# Patient Record
Sex: Female | Born: 1935
Health system: Southern US, Community
[De-identification: ages and names within clinical notes are randomized; demographics above are authoritative.]

## PROBLEM LIST (undated history)

## (undated) DIAGNOSIS — K222 Esophageal obstruction: Secondary | ICD-10-CM

## (undated) DIAGNOSIS — G459 Transient cerebral ischemic attack, unspecified: Secondary | ICD-10-CM

## (undated) DIAGNOSIS — K224 Dyskinesia of esophagus: Secondary | ICD-10-CM

## (undated) DIAGNOSIS — IMO0002 Reserved for concepts with insufficient information to code with codable children: Secondary | ICD-10-CM

## (undated) DIAGNOSIS — F32A Depression, unspecified: Secondary | ICD-10-CM

## (undated) DIAGNOSIS — K5790 Diverticulosis of intestine, part unspecified, without perforation or abscess without bleeding: Secondary | ICD-10-CM

## (undated) DIAGNOSIS — K219 Gastro-esophageal reflux disease without esophagitis: Secondary | ICD-10-CM

## (undated) DIAGNOSIS — K120 Recurrent oral aphthae: Secondary | ICD-10-CM

## (undated) DIAGNOSIS — R12 Heartburn: Secondary | ICD-10-CM

## (undated) DIAGNOSIS — I10 Essential (primary) hypertension: Secondary | ICD-10-CM

## (undated) DIAGNOSIS — F329 Major depressive disorder, single episode, unspecified: Secondary | ICD-10-CM

## (undated) HISTORY — DX: Major depressive disorder, single episode, unspecified: F32.9

## (undated) HISTORY — DX: Transient cerebral ischemic attack, unspecified: G45.9

## (undated) HISTORY — DX: Heartburn: R12

## (undated) HISTORY — DX: Diverticulosis of intestine, part unspecified, without perforation or abscess without bleeding: K57.90

## (undated) HISTORY — PX: JOINT REPLACEMENT: SHX530

## (undated) HISTORY — DX: Essential (primary) hypertension: I10

## (undated) HISTORY — PX: CATARACT EXTRACTION: SUR2

## (undated) HISTORY — DX: Depression, unspecified: F32.A

## (undated) HISTORY — PX: COLON SURGERY: SHX602

## (undated) HISTORY — DX: Dyskinesia of esophagus: K22.4

## (undated) HISTORY — PX: APPENDECTOMY: SHX54

## (undated) HISTORY — DX: Reserved for concepts with insufficient information to code with codable children: IMO0002

## (undated) HISTORY — PX: SHOULDER SURGERY: SHX246

## (undated) HISTORY — DX: Recurrent oral aphthae: K12.0

## (undated) HISTORY — DX: Esophageal obstruction: K22.2

---

## 1991-09-02 HISTORY — PX: PARTIAL COLECTOMY: SHX5273

## 1998-04-08 ENCOUNTER — Other Ambulatory Visit: Admission: RE | Admit: 1998-04-08 | Discharge: 1998-04-08 | Payer: Self-pay | Admitting: Family Medicine

## 1999-04-30 ENCOUNTER — Other Ambulatory Visit: Admission: RE | Admit: 1999-04-30 | Discharge: 1999-04-30 | Payer: Self-pay | Admitting: Family Medicine

## 2000-08-15 ENCOUNTER — Other Ambulatory Visit: Admission: RE | Admit: 2000-08-15 | Discharge: 2000-08-15 | Payer: Self-pay | Admitting: Family Medicine

## 2001-02-27 ENCOUNTER — Encounter: Admission: RE | Admit: 2001-02-27 | Discharge: 2001-02-27 | Payer: Self-pay | Admitting: Family Medicine

## 2001-02-27 ENCOUNTER — Encounter: Payer: Self-pay | Admitting: Family Medicine

## 2001-07-19 ENCOUNTER — Emergency Department (HOSPITAL_COMMUNITY): Admission: EM | Admit: 2001-07-19 | Discharge: 2001-07-19 | Payer: Self-pay | Admitting: Emergency Medicine

## 2001-07-19 ENCOUNTER — Encounter: Payer: Self-pay | Admitting: Emergency Medicine

## 2001-08-14 ENCOUNTER — Ambulatory Visit (HOSPITAL_COMMUNITY): Admission: RE | Admit: 2001-08-14 | Discharge: 2001-08-14 | Payer: Self-pay | Admitting: Internal Medicine

## 2001-08-23 ENCOUNTER — Ambulatory Visit (HOSPITAL_COMMUNITY): Admission: RE | Admit: 2001-08-23 | Discharge: 2001-08-23 | Payer: Self-pay | Admitting: Internal Medicine

## 2001-12-26 ENCOUNTER — Encounter: Payer: Self-pay | Admitting: Orthopedic Surgery

## 2001-12-30 HISTORY — PX: TOTAL HIP ARTHROPLASTY: SHX124

## 2001-12-31 ENCOUNTER — Inpatient Hospital Stay (HOSPITAL_COMMUNITY): Admission: RE | Admit: 2001-12-31 | Discharge: 2002-01-07 | Payer: Self-pay | Admitting: Orthopedic Surgery

## 2001-12-31 ENCOUNTER — Encounter: Payer: Self-pay | Admitting: Orthopedic Surgery

## 2002-01-03 ENCOUNTER — Encounter: Payer: Self-pay | Admitting: Orthopedic Surgery

## 2002-01-07 ENCOUNTER — Inpatient Hospital Stay (HOSPITAL_COMMUNITY)
Admission: AD | Admit: 2002-01-07 | Discharge: 2002-01-12 | Payer: Self-pay | Admitting: Physical Medicine & Rehabilitation

## 2002-02-12 ENCOUNTER — Encounter: Admission: RE | Admit: 2002-02-12 | Discharge: 2002-05-09 | Payer: Self-pay | Admitting: Orthopedic Surgery

## 2002-09-11 ENCOUNTER — Other Ambulatory Visit: Admission: RE | Admit: 2002-09-11 | Discharge: 2002-09-11 | Payer: Self-pay | Admitting: Family Medicine

## 2002-09-24 ENCOUNTER — Encounter (INDEPENDENT_AMBULATORY_CARE_PROVIDER_SITE_OTHER): Payer: Self-pay

## 2002-09-24 ENCOUNTER — Ambulatory Visit (HOSPITAL_COMMUNITY): Admission: RE | Admit: 2002-09-24 | Discharge: 2002-09-24 | Payer: Self-pay | Admitting: Obstetrics and Gynecology

## 2004-01-06 ENCOUNTER — Other Ambulatory Visit: Admission: RE | Admit: 2004-01-06 | Discharge: 2004-01-06 | Payer: Self-pay | Admitting: Family Medicine

## 2004-12-01 ENCOUNTER — Encounter: Admission: RE | Admit: 2004-12-01 | Discharge: 2004-12-01 | Payer: Self-pay | Admitting: Family Medicine

## 2004-12-21 ENCOUNTER — Other Ambulatory Visit: Admission: RE | Admit: 2004-12-21 | Discharge: 2004-12-21 | Payer: Self-pay | Admitting: Family Medicine

## 2005-10-26 ENCOUNTER — Encounter: Admission: RE | Admit: 2005-10-26 | Discharge: 2005-10-26 | Payer: Self-pay | Admitting: Family Medicine

## 2006-11-01 ENCOUNTER — Ambulatory Visit: Payer: Self-pay | Admitting: Internal Medicine

## 2006-11-01 ENCOUNTER — Inpatient Hospital Stay (HOSPITAL_COMMUNITY): Admission: EM | Admit: 2006-11-01 | Discharge: 2006-11-07 | Payer: Self-pay | Admitting: Emergency Medicine

## 2006-11-01 ENCOUNTER — Encounter: Payer: Self-pay | Admitting: Internal Medicine

## 2006-11-08 ENCOUNTER — Ambulatory Visit: Payer: Self-pay | Admitting: Internal Medicine

## 2006-12-05 ENCOUNTER — Ambulatory Visit (HOSPITAL_COMMUNITY): Admission: RE | Admit: 2006-12-05 | Discharge: 2006-12-05 | Payer: Self-pay | Admitting: Internal Medicine

## 2006-12-07 ENCOUNTER — Ambulatory Visit (HOSPITAL_COMMUNITY): Admission: RE | Admit: 2006-12-07 | Discharge: 2006-12-07 | Payer: Self-pay | Admitting: Internal Medicine

## 2006-12-12 ENCOUNTER — Ambulatory Visit: Payer: Self-pay | Admitting: Internal Medicine

## 2007-09-11 ENCOUNTER — Encounter: Admission: RE | Admit: 2007-09-11 | Discharge: 2007-09-11 | Payer: Self-pay | Admitting: Family Medicine

## 2010-06-02 ENCOUNTER — Ambulatory Visit: Payer: Self-pay | Admitting: Cardiovascular Disease

## 2010-06-14 ENCOUNTER — Telehealth (INDEPENDENT_AMBULATORY_CARE_PROVIDER_SITE_OTHER): Payer: Self-pay | Admitting: *Deleted

## 2010-06-15 ENCOUNTER — Encounter: Payer: Self-pay | Admitting: Cardiovascular Disease

## 2010-06-15 ENCOUNTER — Ambulatory Visit: Payer: Self-pay

## 2010-06-15 ENCOUNTER — Ambulatory Visit: Payer: Self-pay | Admitting: Cardiovascular Disease

## 2010-06-15 ENCOUNTER — Encounter (HOSPITAL_COMMUNITY)
Admission: RE | Admit: 2010-06-15 | Discharge: 2010-07-31 | Payer: Self-pay | Source: Home / Self Care | Attending: Cardiovascular Disease | Admitting: Cardiovascular Disease

## 2010-08-31 NOTE — Progress Notes (Signed)
Summary: Nuclear pre procedure  Phone Note Outgoing Call Call back at [facility] Phone [phone]   Call placed by: Allen Kell, RT-N,  June 14, 2010 3:25 PM Call placed to: Patient Summary of Call: Reviewed information on My[employer] Information Sheet (see scanned document for further details).  Spoke with patient.

## 2010-08-31 NOTE — Assessment & Plan Note (Signed)
Summary: Cardiology Nuclear Testing  Nuclear Med Background Indications for Stress Test: Evaluation for Ischemia   History: Echo   Symptoms: Chest Pain, Chest Tightness, Fatigue    Nuclear Pre-Procedure Cardiac Risk Factors: Family History - CAD, History of Smoking, Lipids Caffeine/Decaff Intake: None NPO After: 9:00 PM Lungs: clear IV 0.9% NS with Angio Cath: 22g     IV Site: R Wrist IV Started by: Irean Hong, RN Chest Size (in) 40     Cup Size D     Height (in): 64 Weight (lb): 165 BMI: 28.42  Nuclear Med Study 1 or 2 day study:  1 day     Stress Test Type:  Eugenie Birks Reading MD:  Charlton Haws, MD     Resting Radionuclide:  Technetium 31m Tetrofosmin     Resting Radionuclide Dose:  11 mCi  Stress Radionuclide:  Technetium 71m Tetrofosmin     Stress Radionuclide Dose:  33 mCi   Stress Protocol   Lexiscan: 0.4 mg   Stress Test Technologist:  Milana Na, EMT-P     Nuclear Technologist:  Doyne Keel, CNMT  Rest Procedure  Myocardial perfusion imaging was performed at rest 45 minutes following the intravenous administration of Technetium 31m Tetrofosmin.  Stress Procedure  The patient received IV Lexiscan 0.4 mg over 15-seconds.  Technetium 37m Tetrofosmin injected at 30-seconds.  There were no significant changes with infusion.  Quantitative spect images were obtained after a 45 minute delay.  QPS Raw Data Images:  Normal; no motion artifact; normal heart/lung ratio. Stress Images:  Normal homogeneous uptake in all areas of the myocardium. Rest Images:  Normal homogeneous uptake in all areas of the myocardium. Subtraction (SDS):  Normal Transient Ischemic Dilatation:  1.07  (Normal <1.22)  Lung/Heart Ratio:  .34  (Normal <0.45)  Quantitative Gated Spect Images QGS EDV:  65 ml QGS ESV:  `14 ml QGS EF:  78 % QGS cine images:  normal  Findings Low risk nuclear study      Overall Impression  Exercise Capacity: Lexiscan with no exercise. BP Response:  Normal blood pressure response. Clinical Symptoms: Dyspnea ECG Impression: No significant ST segment change suggestive of ischemia. Overall Impression: Mild apical thinning thought due to axis of rotation artifact.  No ischemia

## 2010-12-17 NOTE — Procedures (Signed)
[facility]  Patient:    Tina [name], Tina [name] Visit Number: [id number] MRN: [id number]          Service Type: END Location: ENDO Attending Physician:  Mervin Hack Dictated by:   Hedwig Morton. Juanda Chance, M.D. [facility] Proc. Date: 08/23/01 Admit Date:  08/23/2001                             Procedure Report  ADDENDUM:  Please send a copy of the previous report No.[id number] to Dr. Margrett Rud. Dictated by:   Hedwig Morton. Juanda Chance, M.D. [facility] Attending Physician:  Mervin Hack DD:  08/23/01 TD:  08/23/01 Job: [id number]

## 2010-12-17 NOTE — Consult Note (Signed)
NAMEEVALEIGH, Tina [name] NO.:  [id number]   MEDICAL RECORD NO.:  [id number]          PATIENT TYPE:  EMS   LOCATION:  MAJO                         FACILITY:  [facility]   PHYSICIAN:  Genene Churn. Love, M.D.    DATE OF BIRTH:  May 11, 1931   DATE OF CONSULTATION:  10/31/2006  DATE OF DISCHARGE:                                 CONSULTATION   This 77 year old right-handed white married female is seen as a code  stroke in the [facility] emergency room with an NIH stroke  scale of 1.  She is being admitted and will be followed by the stroke  service.   HISTORY OF PRESENT ILLNESS:  Ms. Sciara has no known history of high  blood pressure, diabetes, heart disease or stroke.  She does not smoke  cigarettes and has no known history of coronary artery disease.  She has  been in her usual state of health until Saturday when she went to visit  her mother-in-law in a skilled nursing facility.  Sunday she noted the  onset of frequent coughing spells and Monday she stayed in bed because  she did not feel well.  She noted chills at that time.  On this evening  at about 5:30 p.m. she developed recurrent nausea and vomiting.  She  felt as if she was going to faint and she fell out of her chair to the  left.  Her husband thought that her right side of her face looked weak.  Ambulance attendants arrived and gave her Phenergan IV and she then  developed slurred speech.  She has had a headache for 2 days in the  bifrontal region.  Several weeks ago she awakened with left arm numbness  in the middle of the night and thought she slept on her arm.   CURRENT MEDICATIONS:  1. Lipitor 10 mg daily.  2. Fish oil.  3. Aciphex.   She does not take aspirin.   PAST MEDICAL HISTORY:  1. Significant for diverticulitis surgery in the 1980s.  2. She had a right hip replacement in 2000.  3. She had bilateral cataract surgery.   PHYSICAL EXAMINATION:  GENERAL APPEARANCE:  Well-developed white  female.  VITAL SIGNS:  Blood pressure right and left arm 150/80 and heart rate  64.  VASCULAR:  There were no bruits.  MENTAL STATUS:  She is alert and oriented x3.  She followed one, two and  three-step commands.  CRANIAL NERVES:  Examination revealed visual fields to be full.  Both  disks were seen and flat.  The extraocular which full.  The corneals  were present.  Facial sensation was equal.  There was no facial motor  asymmetry.  Tongue was midline.  The uvula was midline and gags were  present.  She had a slight dysarthria.  MOTOR:  Examination with good strength in the upper and lower  extremities except she had pain when moving her right leg at that hip.  The deep tendon reflexes were 1+ and plantar responses were downgoing.  She felt sensation to pinprick bilaterally.   IMPRESSION:  1.  Transient ischemic attack, code 435.9 versus upper respiratory      infection or flu-like illness with infection.  2. Syncope by history, code 780.2.   At this time is recommended MRI and MRA and follow the patient in the  hospital.  Would begin an aspirin suppository.           ______________________________  Genene Churn. Sandria Manly, M.D.     JML/MEDQ  D:  10/31/2006  T:  11/01/2006  Job:  [id number]

## 2010-12-17 NOTE — Discharge Summary (Signed)
[place]  Patient:    Tina [name], Tina [name] Visit Number: [id number] MRN: [id number]          Service Type: [facility] Location: [id number] Attending Physician:  Herold Harms Dictated by:   Mcarthur Rossetti. Angiulli, P.A. Admit Date:  01/07/2002 Discharge Date: 01/12/2002                             Discharge Summary  DISCHARGE DIAGNOSES: 1. Right total hip replacement on June 2, secondary to osteoarthritis. 2. Anemia. 3. Hyperlipidemia. 4. Diverticulitis with gastroesophageal reflux disease.  HISTORY OF PRESENT ILLNESS:  This is a 77 year old, white female admitted on June 2, with end-stage osteoarthritis of the right hip and no relief with conservative care.  She underwent a right total hip replacement on June 2, per Dr. Priscille Kluver and placed on Arixtra for deep venous thrombosis prophylaxis with weightbearing as tolerated.  She had postoperative anemia and was transfused on June 3.  Empiric Cipro for urinary tract infection given with followup cultures showing no growth.  She was supervision for ambulation and minimal assistance for transfers.  Latest hemoglobin 8.2 and monitored.  She was admitted for a comprehensive rehabilitation program.  PAST MEDICAL HISTORY:  See discharge diagnoses.  PAST SURGICAL HISTORY: 1. Partial colectomy. 2. Appendectomy. 3. Right shoulder surgery. 4. D&C.  ALLERGIES:  ASPIRIN, CELEBREX and BEXTRA.  PRIMARY PHYSICIAN:  Dr. Margrett Rud, phone [phone].  MEDICATIONS: 1. Lipitor. 2. Prilosec. 3. Premarin. 4. Prempro. 5. Librax for colon spasms. 6. Os-Cal.  SOCIAL HISTORY:  She lives with husband in [place] and was independent prior to admission.  She has a one-level home with two steps to entry. Husband with limited lifting restrictions.  HOSPITAL COURSE:  The patient did well on rehabilitation services with therapies initiated on a b.i.d. basis.  The following issues were followed during the patients  rehabilitation course:  Pertaining to Ms. Pierces right total hip replacement, the surgical site is healing nicely.  Her hospital course remained uneventful.  Staples were intact with no signs of infection. She was weightbearing as tolerated with total hip precautions and would follow up with orthopedic services, Dr. Priscille Kluver.  She continued on Arixtra for deep venous thrombosis prophylaxis.  Venous Doppler studies prior to her discharge were negative.  Postoperative anemia was stable at 10, hematocrit 29.5 and there were no bleeding episodes.  She continued on Lipitor for her hyperlipidemia.  She had no nausea or vomiting.  She was monitored closely for history of diverticulitis.  She had been on Librax for colon spasms as needed. Her bowel program was well-regulated.  Trinsicon was withheld due to her history of diverticulitis and ongoing constipation.  Overall for her functional mobility, she was minimal assist for bed mobility and supervision for transfers with ambulating supervision 120 feet navigating stairs with minimal assistance, supervision for all activities of daily living.  She would be discharged to home with her husband.  She was independent in her room.  Home health physical therapy had been arranged.  Latest labs showed a sodium of 139, potassium 4.5, BUN 11, creatinine 0.8, hemoglobin 10, hematocrit 29.5.  DISCHARGE MEDICATIONS: 1. Lipitor daily. 2. Protonix 40 mg daily. 3. Prempro daily. 4. Multivitamin daily. 5. Oxycodone 5 mg one tablet every four to six hours as needed for pain.  ACTIVITY:  Weightbearing as tolerated with total hip precautions.  DIET:  Regular.  WOUND CARE:  Cleanse incision daily  with warm soap and water.  Call Dr. Priscille Kluver with any increased redness, change or fever.  SPECIAL INSTRUCTIONS:  Home health physical therapy was arranged.  FOLLOWUP:  Follow up with Dr. Margrett Rud for medical management. Dictated by:   Mcarthur Rossetti. Angiulli,  P.A. Attending Physician:  Herold Harms DD:  01/10/02 TD:  01/12/02 Job: [id number]

## 2010-12-17 NOTE — Procedures (Signed)
[facility]  Patient:    Tina [name], Tina [name] Visit Number: [id number] MRN: [id number]          Service Type: END Location: ENDO Attending Physician:  Mervin Hack Dictated by:   Hedwig Morton. Juanda Chance, M.D. [facility] Proc. Date: 08/23/01 Admit Date:  08/23/2001   CC:         Duffy Rhody C. Andrey Campanile, M.D.   Procedure Report  PROCEDURE:  Upper endoscopy.  ENDOSCOPIST:  Hedwig Morton. Juanda Chance, M.D.  INDICATIONS:  This 77 year old white female has a history of diverticulitis, sigmoid resection for diverticular disease in 1992, and a persistent left middle and upper quadrant abdominal pain.  Recent colonoscopy showed marked diverticulosis proximal to the sigmoid anastomosis. She has continued to have dyspepsia and also occasional dysphagia and for these reasons she is undergoing upper endoscopy.  Ultrasound of the abdomen was negative.  ENDOSCOPE:  Olympus single channel videoscope.  SEDATION:  Versed 5 mg IV, Demerol 50 mg IV.  DESCRIPTION OF PROCEDURE:  The Olympus single channel videoscope was passed directly into the posterior pharynx into esophagus.  The patient was monitored by pulse oximetry.  Her oxygen saturations were normal.  The proximal and distal esophageal mucosa was unremarkable.  There was no evidence of stricture.  Squamocolumnar junction appeared normal. There was no hiatal hernia.  Endoscope traversed through the lower esophageal sphincter into the stomach without resistance.  Stomach: The stomach was insufflated with air, there was no bile present. Gastric ports were normal.  Gastric antra was unremarkable.  Clo-test was taken from minimally erythematous area over the prepyloric area.  Retroflexion of the endoscope revealed normal fundus and cardiac.  Pyloric outlet was normal.  Duodenum: The duodenum, duodenal bulb and descending duodenum were normal.  IMPRESSION:  Essentially normal upper endoscopy of esophagus, stomach and duodenum with  minimal hyperemia of the gastric antrum status post Clo-test.  PLAN:  The patients GI work-up really has been negative in the sense that she has symptomatic diverticulosis but no major lesions.  She will be treated for irritable bowel syndrome as well as for diverticulosis with antispasmodics. She will continue on her proton pump inhibitor medications and stay on high fiber diet. Dictated by:   Hedwig Morton. Juanda Chance, M.D. [facility] Attending Physician:  Mervin Hack DD:  08/23/01 TD:  08/23/01 Job: [id number]

## 2010-12-17 NOTE — Op Note (Signed)
NAMELAKETRA, BOWDISH NO.:  [id number]   MEDICAL RECORD NO.:  [id number]          PATIENT TYPE:  INP   LOCATION:  1826                         FACILITY:  [facility]   PHYSICIAN:  Elliot Cousin, M.D.    DATE OF BIRTH:  11/09/1930   DATE OF PROCEDURE:  DATE OF DISCHARGE:                               OPERATIVE REPORT   PRIMARY CARE PHYSICIAN:  Unassigned. (Formerly Dr. Karma Ganja prior to  his retirement).   CHIEF COMPLAINT:  The patient passed out while vomiting yesterday.  She  has had a chief complaint of nausea, vomiting, productive cough,  headache, bilateral jaw pains, sore throat and subjective fever and  chills.   HISTORY OF PRESENT ILLNESS:  The patient is a 77 year old woman with a  past medical history significant for hyperlipidemia, gastroesophageal  reflux disease, and diverticulitis, who presented to the emergency  department with the chief complaint of nausea, vomiting, productive  cough, generalized weakness, headache, and subjective fever and chills.  The patient has had these symptoms for approximately one week.  She had  been treating her symptoms with over-the-counter Mucinex and as needed  Tylenol for achiness.  She actually took some of her husband's  antibiotics over the past couple of days (amoxicillin).  Today, she  attempted to eat some chicken soup.  Soon afterwards she fell over on  the floor from the chair as witnessed by her husband, Mr. Canady.  As  she was falling, she had multiple episodes of vomiting.  She remained  unconscious for approximately one minute and then regained consciousness  according to Mr. Ghuman.  EMS was  called.  En route to the hospital,  the EMT apparently gave the patient Phenergan either IM or IV.  When she  arrived to the emergency department, she had significant dysarthria.  Of  note, the patient's husband mentioned that the patient had a slight  facial droop while she was lying on the floor at home, and  this was told  to the EMT.  When the patient arrived to the emergency department, a  code stroke was called.  Neurologist, Dr. Sandria Manly, evaluated the patient  and felt that the patient probably did not have an acute stroke.  The CT  scan of the head revealed no acute intracranial findings and no  fractures.  The patient is currently back to baseline according to her  husband.  There is no evidence of facial droop nor is there any evidence  of slurred speech per the patient's husband's assessment.   The patient has also had burning chest pain starting from her upper  chest and radiating to the upper abdomen.  She has not had any black,  tarry stools, bright red blood per rectum, or diarrhea.  She has chronic  constipation.  The patient will occasionally require over-the-counter  laxatives.  Her last bowel movement was two days ago.  She has had some  left-sided flank pain but no pain with urination.  Her cough has been  productive with yellow sputum.  She has had a headache globally with  some radiation to  the jaw and she has had an earache as well. According  to the patient's husband, there was no evidence of coffee ground emesis  or bright red blood in her emesis.   The patient will be admitted for further evaluation and management.   PAST MEDICAL HISTORY:  1. Hyperlipidemia.  2. Gastroesophageal reflux disease.  3. Moderately severe left colon diverticulosis with status post remote      sigmoid resection for diverticulitis and a normal-appearing      anastomosis per colonoscopy by Dr. Juanda Chance in January 2003.  4. Minimal hyperemia in the gastric antrum per EGD in January 2003 by      Dr. Juanda Chance.  5. Status post right AML total hip replacement in June 2003.  6. Status post D&C hysteroscopy and excision of an endometrial polyp      in February 2004.  7. Status post right shoulder surgery in the 1980's.   MEDICATIONS:  1. Lipitor 10 mg daily.  2. AcipHex 20 mg daily.  3. Aspirin 81  mg daily.  4. Tranxene 3.75 mg half tablet at bedtime p.r.n. (the patient takes      once or twice weekly).  5. Darvocet-N 100 one tablet every six hours as needed for pain (the      patient rarely uses it).  6. Mucinex one tablet b.i.d.  7. Vicodin 5 mg every six hours as needed for pain (the patient rarely      takes).   ALLERGIES:  No known drug allergies.   SOCIAL HISTORY:  The patient is married.  [place] She has two children.  She is a Futures trader.  She denies tobacco  or alcohol and elicit drug use.   FAMILY HISTORY:  Her mother is 68 years of age and has a chronic  suprapubic catheter.  Her father died at 36 years of age from  complications of cancer, heart disease and diabetes.   REVIEW OF SYSTEMS:  As above in the history of present illness.   PHYSICAL EXAMINATION:  VITAL SIGNS:  Temperature 98.9, blood pressure  133/61, pulse 82, respiratory rate 18, oxygen saturation 99% on 2 L  nasal cannula oxygen.  GENERAL:  The patient is a 77 year old woman who is currently lying in  bed in no acute distress, but she does appear ill.  Marland Kitchen  HEENT:  Head is normocephalic, atraumatic.  Pupils are equal, round and  reactive to light.  Extraocular movements are intact.  Conjunctivae  clear.  Sclerae white.  Tympanic membranes are clear bilaterally without  any evidence of tympanic membrane abnormalities.  Oropharynx reveals no  posterior exudate or erythema.  Mucous membranes are dry.  NECK:  Supple. No adenopathy.  No thyromegaly.  No bruits.  No JVD.  LUNGS:  A few crackles auscultated on the left greater than right with  an occasional wheeze.  ABDOMEN:  Positive bowel sounds. Soft, nontender, nondistended.  No  hepatosplenomegaly.  No masses palpated.  No bilateral flank tenderness.  GU/RECTAL:  Deferred.  EXTREMITIES:  Pedal pulses are 2+ bilaterally.  No pretibial edema.  No  pedal edema. NEUROLOGIC:  The patient is alert and oriented x3.  Cranial  nerves II-  XII intact.  Strength is 5/5 throughout.  Sensation is intact.   ADMISSION LABORATORY DATA:  EKG reveals normal sinus rhythm with a heart  rate of 81 beats per minute and no acute abnormalities.  Chest x-ray  results reveal no acute findings.  CT scan of  the head results are  above.  Myoglobin 87.6, CK-MB less than 1, troponin-I less than 0.05.  Urinalysis negative.  Sodium 136, potassium 4.1, chloride 106, CO2 26,  glucose 123, BUN 11, creatinine 0.8, calcium 8.4.  Urine drug screen  positive for benzodiazepines.   ASSESSMENT:  1. Syncope.  Given the patient's clinical history and recent symptoms,      more than likely the syncope is secondary to a vasovagal response      from vomiting and secondarily to persistent cough.  The patient may      also be orthostatic.  2. Code stroke called.  The patient is neurologically intact      currently.  Her dysarthria can be probably attributed to Phenergan      given by the EMT prior to arrival to the emergency department.  The      patient does not currently have any neurologic sequelae.  She may      have had a transient ischemic attack and this will be assessed      further.  3. Probable URI  versus bronchitis versus early pneumonia.  The      patient's chest x-ray is clear.  However, clinically she has      pulmonary crackles and has had respiratory symptoms for the past      week.   PLAN:  1. The patient will be admitted for further evaluation and management.  2. Will evaluate the patient further with an MRI and MRA of the brain.  3. Will check a vitamin B12, TSH, and cardiac enzymes x2.  4. Will check a rapid strep test and sputum culture.  5. Will check orthostatic vital signs.  6. Will start empiric antibiotic treatment with Rocephin and      azithromycin.  7. Supportive and symptomatic treatment with albuterol nebulizer,      Mucinex, Robitussin, and Tessalon Perls p.r.n.  8. Will treat the patient's nausea with as  needed Zofran and a smaller      dose of Phenergan.  9. Gentle IV fluids.      Elliot Cousin, M.D.  Electronically Signed     DF/MEDQ  D:  11/01/2006  T:  11/01/2006  Job:  [id number]

## 2010-12-17 NOTE — Discharge Summary (Signed)
NAME:  Tina [name], Tina [name]                         ACCOUNT NO.:  [id number]   MEDICAL RECORD NO.:  [id number]                   PATIENT TYPE:  NP   LOCATION:  5002                                 FACILITY:  [facility]   PHYSICIAN:  John L. Rendall, M.D.               DATE OF BIRTH:  Feb 05, 1931   DATE OF ADMISSION:  12/31/2001  DATE OF DISCHARGE:  01/07/2002                                 DISCHARGE SUMMARY   ADMISSION DIAGNOSES:  1. End-stage osteoarthritis right hip.  2. History of diverticulosis.  3. Gastroesophageal reflux disease.  4. Hypercholesterolemia.  5. Anxiety.  6. Stress urinary incontinence.   DISCHARGE DIAGNOSES:  1. Right total hip arthroplasty.  2. Urinary tract infection.  3. Postoperative blood loss anemia.  4. History of diverticulosis.  5. Esophageal reflux disease.  6. Anxiety.  7. History of stress urinary incontinence.  8. Hypertension.   HISTORY OF PRESENT ILLNESS:  The patient is a 77 year old white female who  presents with about a two year history of gradual onset of progressively  worsening right hip pain.  The patient denies any previous injury or  surgical procedures.  The pain is a constant pain in the right groin around  to the thigh and into the buttocks.  The pain worsens with any weightbearing  activity and bending, improves with Darvocet.  The patient does have a  catching sensation.  She does have night pain and a limp.   ALLERGIES:  CELEBREX, BEXTRA, ASPIRIN.   CURRENT MEDICATIONS:  1. Lipitor 10 mg p.o. q.d.  2. Prilosec 20 mg p.o. q.d.  3. Prempro 0.625/2.5 mg p.o. q.d.  4. Librax 5/2.5 mg p.o. q.d., p.r.n.  5. Darvocet-N 100 p.r.n.  6. Multivitamins 1 tablet p.o. q.d.  7. Glucosamine chondroitin.  8. Citrucel or FiberCon 2 p.o. q.d.  9. Iron 325 mg p.o. b.i.d.   SURGICAL PROCEDURE:  On December 31, 2001 the patient was taken to the OR by Dr.  Jonny Ruiz L. Rendall assisted by Jamelle Rushing, PA-C and [facility], PA-S.  The  patient  underwent a right total hip arthroplasty.  The patient tolerated the  procedure well.  The component fit well and was nice and stable.  Operative  time was one hour and 10 minutes.  Blood loss estimated at 250 cc.  The  patient tolerated the procedure well and was transferred to the recovery  room and then to the orthopedic floor in good condition.   CONSULTS:  The following routine consults were requested:  Physical therapy,  occupational therapy, rehab, case management.   HOSPITAL COURSE:  On December 31, 2001 the patient was admitted to [facility] under the care of Dr. Jonny Ruiz L. Rendall, M.D.  The patient was taken  to the OR where a right total hip arthroplasty was performed.  The patient  tolerated the procedure well, was transferred to the recovery room and  then  to the orthopedic floor for routine postop care.  The patient was placed on  Arixtra for routine DVT prophylaxis.   The patient then incurred a total of six days of postoperative care on the  orthopedic floor in which the patient then incurred some postoperative blood  loss anemia with her hemoglobin dropping to 8.0.  She was type and crossed  and transfused two units of packed red blood cells with her hemoglobin  improving to 9.2.  The patient did have some borderline hypokalemia which  was replaced with p.o. potassium and some KCL in her IV fluids.  The  patient's wounds remained benign for any signs of infection.  Her legs  remained neuromotor vascularly intact.  She did have some slight elevation  of her heart rate and a temperature with a T-max of 101.8.  The patient also  incurred some weak and rundown sensation, unable to void and move her  bowels.  She had little appetite and a full and tender feeling in her left  upper quadrant with palpation and deep breathing.  The patient was evaluated  with abdominal x-rays which were negative for any signs of ileus or  obstruction.  Urinalysis showed few bacteria, 30  protein and a few squamous  cells, and the patient was on, at that time, OxyContin CR so it was felt  that a lot of this was being contributed to some constipation, a urinary  tract infection and some excessive narcotics.  So the patient was placed on  Cipro, she received an enema, and her OxyContin CR was dc'd.   The following day the patient was feeling much better.  She felt like she  had more energy.  She had a small bowel movement.  Her vital signs were  stable.  She was afebrile.  The patient was then able to work well with  physical therapy but it was felt that she would benefit from a short stay on  the rehab floor due to her home situation so she was evaluated and she was  accepted to rehab services on postop day #6, and she was transferred to that  unit in good condition.  The patient's hemoglobin on the date of transfer  did drop to 8.2 and stools were guaiaced.  This information was provided  with the patient going to the rehab floor and wound be monitored at that  time.   EKG on admission was normal sinus rhythm at 71 beats per minute, PRT axis of  24, 53 and 56.  EKG on June 5th showed sinus tachycardia at 108, unable to  rule out inferior infarct with a PRT axis of 32, 40 and a -15.  June 5th  chest x-ray shows right perihilar and left basilar band-like opacities  suggesting atelectasis.  Abdominal x-ray showing gas and stool throughout  the colon which appears nondilated.  No dilated bowel to suggest  obstruction.   CBC on January 05, 2002:  WBCs 6.3, hemoglobin 8.2, hematocrit 24.0, platelets  of 237.  Once again this was continued to be monitored and information was  passed with the patient being transferred to the rehab services.  Routine  chemistries:  Sodium 140, potassium 4.1, glucose 129, BUN 16, creatinine  0.8.   Routine urinalysis on June 5th showed protein 30, few epithelial cells,  bacteria few, with a urine culture that shows no growth after one day.   The  patient received two units of packed red blood cells during  hospitalization.   MEDICATIONS UPON TRANSFER TO REHAB FLOOR:  1. Zocor 20 mg p.o. q.d.  2. Protonix 40 mg p.o. q.d.  3. Premarin 0.625 mg p.o. q.d.  4. Provera 2.5 mg p.o. q.d.  5. Multivitamins with minerals 1 capsule p.o. q.d.  6. Ferrous sulfate 325 mg p.o. q.d.  7. Colace 100 mg p.o. q.d.  8. Arixtra 2.5 subcu q. 24 hours.  9. Sodium chloride nasal spray 1 drop q.i.d.  10.      Cipro 500 mg p.o. b.i.d.  11.      Librax 1 capsule p.o. q.d. p.r.n.  12.      Ambien 5-10 mg p.o. q.h.s. p.r.n.  13.      Reglan 10 mg p.o. q. 4 hours.  14.      Laxative enema of choice p.r.n.  15.      Tylenol 650 mg p.o. q. 6 hours p.r.n.  16.      Oxycodone 5 mg p.o. q. 4 hours p.r.n.  17.      Vicodin 1 or 2 tablets every 4-6 hours p.r.n.  Please use Vicodin     for Percocet.   DISCHARGE INSTRUCTIONS:  To transfer to rehab unit.  MEDICATIONS:  Patient to continue meds as dispensed on ortho floor.   ACTIVITY:  Patient may be weightbearing as tolerated with total hip  precautions.   DIET:  No restrictions.   WOUND CARE:  Patient should have wound checked daily for any signs of  infection.  Staples to be removed on postop day #14.   FOLLOWUP:  The patient should have a followup appointment with Dr. Priscille Kluver  one week from date of discharge from rehab services.   CONDITION ON DISCHARGE:  The patient's condition on discharge to rehab  services is improved and good.     Jamelle Rushing, P.A.                      John L. Priscille Kluver, M.D.    RWK/MEDQ  D:  03/10/2002  T:  03/14/2002  Job:  [id number]   cc:   Duffy Rhody C. Andrey Campanile, M.D.

## 2010-12-17 NOTE — Discharge Summary (Signed)
Tina, [name]               ACCOUNT NO.:  [id number]   MEDICAL RECORD NO.:  [id number]          PATIENT TYPE:  INP   LOCATION:  5527                         FACILITY:  [facility]   PHYSICIAN:  Tina [name], M.D.DATE OF BIRTH:  [birth year]   DATE OF ADMISSION:  10/31/2006  DATE OF DISCHARGE:  11/07/2006                               DISCHARGE SUMMARY   PRIMARY CARE PHYSICIAN:  Dr. Karma [name], who is retired.   GASTROENTEROLOGIST:  Tina Morton. Juanda Chance, MD.   FINAL DIAGNOSES:  1. Vasovagal syncope.  2. Dysphagia, with esophageal dysmotility and stricture on barium      swallow.  3. Acute bronchitis.  4. Acute sinusitis.  5. Leukopenia, improved.   SECONDARY DIAGNOSIS:  Gastroesophageal reflux disease.   PROCEDURES:  1. Head CT scan showed no acute intracranial findings.  2. MRI of the brain showed no acute infarct.  3. MRA of the had showed no aneurysm or significant stenosis.  There      was fetal origin of the right posterior cerebral artery.  4. Left shoulder x-ray showed no bony injury.  5. Chest x-ray showed prominent right hilar control.  There was a      question of hilar or mediastinal mass.  This was followed by a CT      scan of the chest.  6. CT scan of the chest showed no acute findings.  No right hilar mass      or adenopathy noted.  There was focal fluid in the upper      mediastinum representing either focal pericardial fluid or some      type of benign cyst.  7. Maxillofacial and head CT scan showed maxillary and ethmoid      sinusitis and mild mucosal thickening in sphenoid sinuses. Carotid      ultrasound showed no internal carotid artery stenosis bilaterally,      and there is antegrade flow in both vertebral arteries.  8. Barium swallow showed moderate to severe diffusing climate of      esophageal motility.  Tiny sliding hiatal hernia with stricture,      gastroesophageal junction, obstructing aperture of 0.5 mm diameter,      barium tablet, question  of tiny Schatzki's ring.  9. 2D echocardiogram done on November 01, 2006 showed features consistent      with diastolic dysfunction.  There was no significant valvular      abnormality.  There was no pericardial effusion.  The left      ventricle was normal in size.  Ejection fraction was not estimated.   CONSULTS:  Neurologic consult provided  by Dr. Sandria [name].   BRIEF HISTORY:  Please refer to the admission H&P.  In brief, Tina [name]  is a 77 year old Caucasian femaleemale who resides at home independently with  her husband.  She was in her usual state of health until the morning of  admission, when she complained of nausea, vomiting, cough, headache,  sore throat, and subjective fever and chills.  She apparently passed out  while vomiting.  She was brought to the emergency room.  Apparently,  en  route to the hospital the patient was given some Phenergan either IM or  IV, and she developed dysarthria and question of facial droop.  Code  stroke was called, and this was provided  by Dr. Sandria [name].  He opined that  this was most likely not stroke.  However, the patient was admitted for  further evaluation.   HOSPITAL COURSE:  1. Syncope.  Initial concern was to rule out stroke or TIA.  She had      an extensive workup which included MRI and MRA of the head which      did not show acute infarct.  She was nevertheless continued on      aspirin.  A fasting lipid profile was normal, with LDL of 75 and      HDL of 47.  TSH was also normal, 0.645.  She does not have any      neurological deficits.  A 2D echocardiogram was obtained to further      evaluation the etiology of syncope.  This was unremarkable.  The      patient was orthostatic, and this could have contributed to the      syncope.  In addition, she was vomiting at the time she passed out.      Hence, she could have had vasovagal syncope.  The patient presented      with history of dysarthria and probable TIA versus side effect of      Phenergan.   However, she received Phenergan in the hospital for      nausea and vomiting, and the patient did not have any repeated      dysarthria.  2. It was noted that the patient had upper respiratory symptoms prior      to onset of syncope.  She was treated as such with over-the-counter      Mucinex and Tylenol for achiness and nasal congestion.  She had      bilateral rhonchi and upper airway congestion.  She had a fever of      102.1.  Blood cultures were drawn which were negative.  The patient      was started on Avelox intravenously and Combivent inhaler.  She      does not have a history of tobacco abuse.  The patient had a CT      scan of the head and CT scan of the maxillofacial which also      confirmed ethmoid and maxillary sinusitis.  These symptoms have all      since resolved.  The patient had intravenous antibiotics while in      the hospital, and this was transitioned to p.o. Avelox.  She would      continue with Combivent inhaler p.r.n. at home.  3. Dysphagia.  The patient mentioned during the course of      hospitalization that she has trouble with swallowing pills and      sometimes trouble with food and drinking water such that these may      not go down.  She has never vomited or regurgitated with these.  We      obtained a barium swallow which showed esophageal dysmotility and      stricture at the gastroesophageal junction, with question of      Schatzki's ring.  The patient was offered to have GI consulted in      the hospital.  However, she requested she wanted to go home.  She  wants setup to follow up with Dr. Lina [name] on an outpatient      basis on November 08, 2006 at 1 p.m.  The patient is able to tolerate      her food without any problems.  In the light of this, she was      changed to AcipHex to Protonix.  4. Leukopenia.  The patient was noted on blood work to have a low     white cell count ranging between 2.8-6.2.  This was monitored      closely.  There was  no neutropenia.  At the time of discharge,      white cells were 6.6.  It was felt that the acute infection might      have contributed to the transient leukopenia.  This resolved prior      to discharge.  5. Normocytic anemia.  She had an evaluation with an anemia panel.      Vitamin B12 was normal, 264.  Iron was slightly low, 41.  Iron      binding capacity slightly low, 247.  Red blood cell folate was      normal.  Hemoglobin and hematocrit were stable during the course of      hospitalization, ranging from 10-11 g/dL.  Further anemia workup to      be performed in the outpatient setting.   DISCHARGE MEDICATIONS:  1. Lipitor 10 mg daily.  2. Aspirin 81 mg daily.  3. Avelox 400 mg daily for 3 more days.  4. Prednisone 30 mg daily, to reduce by 10 mg every 3 days.  5. Vicodin p.r.n.  6. Combivent inhaler, use as needed every 2-4 hours.  7. Tussionex 5 cc p.o. b.i.d. for 5 days.  8. Trazodone 25 mg at bedtime p.r.n. for sleep.  9. Protonix 40 mg daily.   DISPOSITION:  The patient was discharged home, to follow up with Dr.  Lina [name] on November 08, 2006.  She does not have an established primary  care physician at this time.  However, she was given telephone numbers  for Dr. Lonia Blood or Dr. Julio Sicks for followup.   CONDITION AT DISCHARGE:  Stable.  Temperature 97.1, pulse 92,  respiratory rate 20, blood pressure 127/81, O2 saturation 97%.  Patient  on room air.   RECOMMENDATIONS:  Anemia workup in outpatient setting.      Tina B. Corky Downs, M.D.  Electronically Signed     MBB/MEDQ  D:  11/08/2006  T:  11/08/2006  Job:  [id number]   cc:   Tina Morton. Juanda Chance, MD

## 2010-12-17 NOTE — Assessment & Plan Note (Signed)
[facility]                         GASTROENTEROLOGY OFFICE NOTE   NAME:[name], Tina HOLTROP                      MRN:          [id number]  DATE:11/08/2006                            DOB:          [birth year]    Tina [name] is a very nice 77 year old white female here today as an  acute walk-in because of dysphagia to pills and solids. She was  discharged from [facility] yesterday and before discharge Dr.  Corky Downs called me concerning abnormal barium swallow which showed distal  esophageal stricture. Tina [name] was admitted after episode of severe  coughing and vomiting after she had a pill stuck in her esophagus. She  had a post vagal syncopal episode. She might have also aspirated because  she has had a lot of congestion and wheezing. Patient was discharged on  Avalox 400 mg daily times 3 as well as prednisone 30 mg daily, decreased  by 10 mg every 3 days. She is on;  1. Tussionex.  2. Protonix 40 mg a day.  3. Citrucel.  4. Aspirin.  5. Lipitor.   We have seen Tina [name] in the past. She has diverticulosis of the  colon and positive family history of colon cancer. Her last colonoscopy  in 2003 showed extensive diverticulosis of the left colon. Her last  upper endoscopy in January 2003 did not show any evidence of esophageal  stricture, her CLOtest was negative.   PHYSICAL EXAMINATION:  Blood pressure 130/74, pulse 76, weight 175  pounds. Patient appeared ill. She was coughing frequently and was  coughing up some sputum. She had also expiratory wheezes.  Sclera nonicteric.  LUNGS: With expiratory wheezes and rales.  COR: With rapid S1, S2.  ABDOMEN: Soft with normoactive bowel sounds, nontender, liver edge at  costal margin. Lower abdomen was normal.  RECTAL EXAM: No stools, mucus was Hemoccult negative.   IMPRESSION:  42. A 77 year old white female with solid food dysphagia and abnormal      upper gastrointestinal series consistent  with distal esophageal      stricture or possibly Schatzki ring. She had a  vagal syncopal      episode.  2. Bronchitis, evaluated in the hospital. Patient currently on      antibiotics.   PLAN:  1. Patient will need upper endoscopy and dilation but at this point I      would like her respiratory condition to improve so she is at better      risk for conscious sedation. She is also quite weak and would      herself prefer to wait a week before we can dilate her. She is      tolerating full liquids and soft diet and I think as long as she is      careful about chewing, she will be able to wait a week.  2. Continue Protonix 40 mg a day.  3. Antireflux measures, consider a referal to pulmonary for      consultation if her respiratory symptoms continue.     Hedwig Morton. Juanda Chance, MD  Electronically Signed    DMB/MedQ  DD: 11/08/2006  DT: 11/08/2006  Job #: [id number]   cc:   Mobolaji B. Corky Downs, M.D.

## 2010-12-17 NOTE — Procedures (Signed)
[facility]  Patient:    Tina [name], Tina [name] Visit Number: [id number] MRN: [id number]          Service Type: END Location: ENDO Attending Physician:  Mervin Hack Dictated by:   Hedwig Morton. Juanda Chance, M.D. [facility] Admit Date:  08/14/2001                             Procedure Report  ADDENDUM:  I just dictated a report on Tina [name], and I forgot to ask if you could please send a copy of the dictation to Dr. Margrett Rud.  Thank you very much. Dictated by:   Hedwig Morton. Juanda Chance, M.D. [facility] Attending Physician:  Mervin Hack DD:  08/14/01 TD:  08/14/01 Job: [id number]

## 2010-12-17 NOTE — H&P (Signed)
[place]  Patient:    Tina [name], Tina [name] Visit Number: [id number] MRN: [id number]          Service Type: SUR Location: 5000 [id number] Attending Physician:  Carolan Shiver Ii Dictated by:   Arnoldo Morale, P.A. Admit Date:  12/31/2001                           History and Physical  DATE OF BIRTH: Jan 27, 1931  CHIEF COMPLAINT: Progressively worsening right hip pain for the last two years.  HISTORY OF PRESENT ILLNESS: This 77 year old white female patient presents to Dr. Priscille Kluver with a two year history of gradual onset but progressively worsening right hip pain.  She has no history of any surgery or prior injury to her hip, but the pain came on kind of gradually.  The pain at this time is a constant almost tearing sensation in the right groin, thigh, and buttock area.  The pain did seem to concentrate initially in the right thigh area but now has extended into the groin and buttock.  There is no other radiation of the pain.  The pain increases with any was seen by, bending over, or trying to touch her feet.  It decreases with the use of Tylenol.  There is no back pain or paresthesias associated with the pain, but the hip does seem to catch at times.  The pain is significant and keeps her up at night.  She also has a pronounced limp and cannot lift her leg into her car without raising it up by using her arms.  She is not currently ambulating with any assistive devices but nothing really seems to have helped the pain.  ALLERGIES (cause exacerbation of her diverticulosis and severe GI upset):  1. ASPIRIN.  2. CELEBREX.  3. BEXTRA  CURRENT MEDICATIONS:  1. Lipitor 10 mg one tablet p.o. q.d.  2. Prilosec 20 mg one tablet p.o. q.d.  3. Prempro 0.625/2.5 mg one tablet p.o. q.d.  4. Librax 5/2.5 mg one tablet p.o. q.d. p.r.n. colon spasm.  5. Darvocet-N 100 one to two p.o. q.6h p.r.n. pain.  6. Chondroitin and glucosamine one tablet p.o.  q.d.  7. Calcium 600 mg one tablet p.o. q.d. p.r.n.  8. Multivitamin one tablet p.o. q.d.  9. Ferrous sulfate 325 mg one tablet p.o. b.i.d. 10. Either Citrucel or FiberCon two tablets p.o. q.d.  PAST MEDICAL HISTORY:  1. She has had diverticulosis for several years and had a diverticulitis     recurrence in January 2003.  Dr. Juanda Chance did do a colonoscopy at that time.  2. The patient does have gastroesophageal reflux disease.  3. Hypercholesterolemia.  She denies any history of diabetes mellitus, hypertension, thyroid disease, hiatal hernia, peptic ulcer disease, heart disease, asthma, or any other chronic medical condition other than noted previously.  PAST SURGICAL HISTORY:  1. Partial colectomy and appendectomy due to diverticulitis by Dr. Ovidio Kin in 1993.  2. Excision of calcium deposit of right shoulder by Dr. Jonny Ruiz L. Rendall in     1991.  3. D&C by Dr. Leona Singleton in 1989.  SOCIAL HISTORY: She has a six to seven-pack-year history of cigarette smoking, which she quit 35 years ago.  She does not drink any alcohol nor use any drugs.  She is married and has two children.  She and her husband live in a one-story house with two steps into  the main entrance.  She is a Futures trader. Her medical doctor is Dr. Margrett Rud and his phone number is [phone].  FAMILY HISTORY: Her mother is alive at age 5, with diverticulosis, congestive heart failure, heart disease, and hypertension.  Her father died at the age of 2 with kidney cancer, heart disease, and he was a diabetic.  She has four brothers who are alive, ages 62, 52, 6, and 1, and the 75 year old has diverticulitis.  She had one brother who died at age 63 due to heart disease. She has two sisters who are alive, one age 25 with heart disease and fibromyalgia, and one age 81 with high cholesterol, hypertension, and obesity. She did have one sister who died at the age of 56 with diverticulosis and acute leukemia.  Her  daughter is 25 and her son is 74.  They are both alive and healthy, but her son does have a history of gout.  REVIEW OF SYSTEMS: She does have some stress urinary incontinence and nocturia one to two times a night.  She does have occasional tinnitus.  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  She has had occasional sinus infections in the past but none recently.  She may have some arthritis in her neck because she complains of a stiff neck at times.  Her diverticulosis causes abdominal cramps and nausea, which is very common for her.  She also has constipation and some rectal pain at times, which she treats with milk of magnesia and a stool softener.  She does have a history of intermittent vaginal Candida infections.  She has some arthritis in her knees and her left hip.  She is cold natured.  She does wear glasses.  She does have a Living Will and her power of attorney is Hope Pigeon.  All other systems are negative and noncontributory.  PHYSICAL EXAMINATION:  GENERAL: Well-developed, well-nourished, overweight white female, who walks with an antalgic gait and a significant right-sided hip limp.  Mood and affect are appropriate.  She is anxious.  She talks easily with the examiner.  VITAL SIGNS: Height 5 feet 5 inches.  Weight 160 pounds.  BMI is 26. TEMP is 99 degrees Fahrenheit, pulse 72, respirations 20, and BP 158/84.  HEENT: Normocephalic, atraumatic, without frontal or maxillary sinus tenderness to palpation.  Conjunctivae pink.  Sclerae anicteric.  PERRLA. EOMI.  No visible ear deformities.  Hearing grossly intact.  Tympanic membranes pearly gray bilaterally with good light reflex.  Nose and nasal septum midline.  Nasal mucosa pink and moist without exudate or polyp noted. Buccal mucosa pink and moist.  Good dentition.  Pharynx without erythema or exudate.  Tongue and uvula are midline.  Tongue without fasciculations and the uvula rises equally with  phonation.  NECK: No visible masses or lesions noted.  Trachea midline.  No palpable  lymphadenopathy or thyromegaly.  Carotids +2 bilaterally without bruits.  She does have decreased range of motion of her cervical spine, with equally decreased range of motion with lateral bending, rotation, and forward flexion. She does have full extension.  Mild pain with palpation over her cervical spine.  CARDIOVASCULAR: Heart rate and rhythm regular.  S1 and S2 present without rubs, clicks, or murmurs noted.  RESPIRATORY: Respirations even and unlabored.  Breath sounds clear to auscultation bilaterally without rales or wheezes noted.  ABDOMEN: Rounded abdominal contour.  Bowel sounds present x4 quadrants.  She does guard her abdomen but it is soft and with examination at this  time she does deny any tenderness, although she flinches now and then with palpation. No masses are palpated.  No hepatosplenomegaly or CVA tenderness.  Femoral pulses +2 bilaterally.  Nontender to palpation along the vertebral column.  BREAST/GU/RECTAL/PELVIC: These examinations are deferred at this time.  MUSCULOSKELETAL: No obvious deformities of bilateral upper extremities, with full range of motion of these extremities without pain.  Radial pulses are +2 bilaterally.  She has full range of motion of her knees, ankles, adenopathy toes bilaterally.  DP and PT pulses are +2.  No lower extremity edema.  The left hip has full extension and flexion to 100 degrees.  She has about 45 degrees of internal rotation and 30 degrees of external rotation.  These are nontender and she has no pain with palpation over the groin or greater trochanter.  She does have some mild pain with palpation over the right greater trochanter in the right groin.  She has full extension but flexion only to 80 degrees.  She has little internal and external rotation.  This is markedly decreased from the left.  There is pain with any range of motion  of her hip.  She has minimal crepitus with range of motion of her right knee but no effusion, and otherwise full range of motion of her right knee.  NEUROLOGIC: Alert and oriented x3.  Cranial nerves II-XII are grossly intact. Strength 5/5 in bilateral upper and lower extremities.  Rapid alternating movements intact.  Deep tendon reflexes 2+ in bilateral upper and lower extremities.  Sensation intact to light touch.  RADIOLOGIC FINDINGS: X-rays taken in August 2002 showed osteoarthritis of the right hip with cyst formation in the femoral head as far along as the acetabular margin.  IMPRESSION:  1. End-stage osteoarthritis, right hip.  2. Diverticulosis with history of diverticulitis, recurrence in January     2003.  3. Gastroesophageal reflux disease.  4. Hypercholesterolemia.  5. 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  6. Stress urinary incontinence.  PLAN: Ms. Tafolla will be admitted to [facility] on December 31, 2001, where she will undergo a right total hip arthroplasty by Dr. Jonny Ruiz L. Rendall.  She will undergo all the routine preoperative laboratory tests and studies prior to this procedure.  If we have any medical problems while she is hospitalized we will consult Dr. Margrett Rud. Dictated by:   Arnoldo Morale, P.A. Attending Physician:  Carolan Shiver Ii DD:  12/27/01 TD:  12/28/01 Job: [id number]

## 2010-12-17 NOTE — Procedures (Signed)
[facility]  Patient:    Tina [name], Tina [name] Visit Number: [id number] MRN: [id number]          Service Type: END Location: ENDO Attending Physician:  Mervin Hack Dictated by:   Hedwig Morton. Juanda Chance, M.D. [facility] Admit Date:  08/14/2001                             Procedure Report  DATE OF BIRTH:  [birth year].  PROCEDURE:  Colonoscopy.  INDICATION:  This 77 year old white female has a history of diverticulitis and sigmoid resection for benign disease in 1993.  She has experienced recurrent left upper and middle quadrant abdominal pain, bloating, and on physical exam had diffuse tenderness of left lower and right lower quadrant, although her stool has been Hemoccult-negative.  She has been treated empirically for diverticulitis with two courses of Cipro without much improvement of her symptoms.  She is undergoing colonoscopy to assess the sigmoid anastomosis and to rule out stricture and also to evaluate her abdominal pain.  ENDOSCOPE:  Olympus single-channel video scope.  SEDATION:  Versed 5 mg IV, Demerol 50 mg IV.  FINDINGS:  Olympus single-channel video endoscope passed under direct vision through rectum to the sigmoid colon.  The patient was monitored by pulse oximeter.  Her oxygen saturations were 95-98% on 2 L of nasal O2.  Her prep was excellent.  Anal canal was normal.  There were no internal hemorrhoids as viewed from retroflexed view.  The sigmoid colon starting at 20 cm from the rectum was involved with a moderate to severe diverticulosis.  There were wide diverticula, some of them rather deep, some shallow.  The lumen was narrowed down to about 50% of normal size.  There was tortuosity but no significant obstruction.  Colonoscope passed through this area without resistance. Mucosal folds were normal.  The most severe changes from diverticulosis were between 20 and 50 cm from the rectum.  The splenic flexure, transverse colon, and  hepatic flexure were unremarkable with normal mucosal vascular pattern. Ascending colon was also normal.  Cecal pouch was reached without difficulty and showed normal ileocecal valve and appendiceal opening.  Video photographs were obtained.  Colonoscope was then retracted, the colon decompressed, and there were no polyps.  IMPRESSION: 1. Moderately severe diverticulosis of the left colon. 2. Status post remote sigmoid resection for diverticulitis with normal-    appearing anastomosis.  PLAN:  The patients pain may be related to irritable bowel syndrome or the symptomatic diverticulosis.  She is also having a variety of upper GI symptoms pertaining to left upper quadrant, and this may have to be evaluated farther with small bowel follow-throguh, upper endoscopy, or ultrasound.  For now she will continue on the Perdiem With Senna for functional constipation, and a high-fiber diet.  We will also give her a trial of antispasmodic, Levsin 0.125 mg on p.r.n. basis.Dictated by:   Hedwig Morton. Juanda Chance, M.D. [facility] Attending Physician:  Mervin Hack DD:  08/14/01 TD:  08/14/01 Job: [id number]

## 2010-12-17 NOTE — Op Note (Signed)
NAME:  [name], Tina [name]                         ACCOUNT NO.:  [id number]   MEDICAL RECORD NO.:  [id number]                   PATIENT TYPE:  AMB   LOCATION:  SDC                                  FACILITY:  [facility]   PHYSICIAN:  Miguel Aschoff, M.D.                    DATE OF BIRTH:  May 11, 1931   DATE OF PROCEDURE:  09/24/2002  DATE OF DISCHARGE:                                 OPERATIVE REPORT   PREOPERATIVE DIAGNOSIS:  Post-menopausal bleeding, on hormone replacement  therapy.   POSTOPERATIVE DIAGNOSES:  Post-menopausal bleeding, on hormone replacement  therapy, with endometrial polyp.   PROCEDURE:  1. Cervical dilatation.  2. Hysteroscopy.  3. Uterine curettage.  4. Removal of endometrial polyp.   SURGEON:  Miguel Aschoff, M.D.   ANESTHESIA:  General.   COMPLICATIONS:  None.   JUSTIFICATION:  The patient is a 77 year old white female who has been on  Prempro therapy.  The patient developed the onset of vaginal bleeding on the  estrogen replacement.  Because of the possibility that this is the result of  neoplasia, she is taken to the operating room to undergo a hysteroscopy and  a D&C, to assess the etiology of the bleeding.  The risks and benefits have  been discussed with the patient.   DESCRIPTION OF PROCEDURE:  The patient was taken to the operating room and  placed in the supine position.  General anesthesia was administered without  difficulty.  She was then placed in the dorsal lithotomy and prepped and  draped in the usual sterile fashion.  The bladder was catheterized.  Examination revealed normal external genitalia, normal Bartholin's and  Skene's glands.  The urethra revealed a urethrocele and a cystocele to be  present.  The cervix was without gross lesions.  The adnexa revealed no  masses.  The uterus was noted to be mid-position, normal size and shape.  The speculum was placed in the vaginal vault.  The anterior cervical lip was  grasped with a tenaculum and a #23  Pratt dilator was advanced through the  cervix.  Once this was done, the hysteroscope was advanced under direct  digitalization.  There were no endocervical lesions noted.  Posteriorly on  the posterior surface of the uterine fundus was a broad-based polypoid mass  with several bleeding points noted within this polypoid mass.  The remainder  of the endometrial cavity was unremarkable.  The hysteroscope was then  removed and then using a medium-size serrated curet, it was possible to  remove this posterior polypoid mass without difficulty.  This was sent for  histology as a separate specimen.  Then a vigorous curettage was carried out  of the remainder of the uterine cavity.  After this was completed, the  hysteroscope was placed back into the uterine cavity, and it was evident  that this mass was now absent.  At this point, the procedure  was completed.  The instruments were removed.  The cervix was injected with 1% Xylocaine for  postoperative analgesia.  The patient tolerated the procedure well.  The estimated blood loss was  approximately 20 mL.  The flow deficit was 40 mL.   DISPOSITION:  The patient is to be discharged home.  Medications for home  include Darvocet-N 100, one q.4h. p.r.n. pain.  She is to call on September 26, 2002, for a pathology report.  She is also to call for any problems,  such as fever, pain, or heavy bleeding.                                                Miguel Aschoff, M.D.    AR/MEDQ  D:  09/24/2002  T:  09/24/2002  Job:  [id number]

## 2010-12-17 NOTE — Procedures (Signed)
EEG NUMBER:  [id number].   REFERRING PHYSICIAN:  Avie Echevaria M.D.   CLINICAL HISTORY:  A 77 year old lady with syncopal episode.   MEDICATIONS LISTED:  Lovenox, aspirin, Protonix, Avelox, Ventolin,  Rocephin, Phenergan, Zofran, Tylenol, Lipitor, AcipHex.   This is a 17-channel EEG recorded with the patient awake and asleep  states.  Using 17-channel machine and standard 10/20 electrode  placement.   Background awake rhythm consists of 10-11 Hz alpha which was of moderate  amplitude, synchronous reactive to eye-opening and closure.  No  paroxysmal epileptiform activity is seen.  Changes of drowsiness and  light sleep are noted.  Prominent sharp waves are seen in central and  temporal head regions bilaterally during drowsiness mainly.  Length of  this EEG is 23.9 minutes.  Technical component is average.  EKG tracing  reveals sinus rhythm.  Hyperventilation is not performed.  Photic  stimulation results are symmetric driving response bilaterally.   IMPRESSION:  This EEG performed during wakefulness and light sleep is  within normal limits.  No definite epileptiform activity is seen.           ______________________________  Sunny Schlein. Pearlean Brownie, MD     [id number]  D:  11/02/2006 18:50:53  T:  11/03/2006 00:21:41  Job #:  [id number]   cc:   Genene Churn. Love, M.D.  Fax: [phone]

## 2010-12-17 NOTE — Op Note (Signed)
[place]  Patient:    Tina [name], Tina [name] Visit Number: [id number] MRN: [id number]          Service Type: SUR Location: 5000 [id number] Attending Physician:  Carolan Shiver Ii Dictated by:   Carlisle Beers. Dorothyann Gibbs, M.D. Proc. Date: 12/31/01 Admit Date:  12/31/2001                             Operative Report  PREOPERATIVE DIAGNOSIS:  Osteoarthritis, right hip.  POSTOPERATIVE DIAGNOSIS:  Osteoarthritis, right hip.  OPERATION PERFORMED:  Right AML total hip replacement.  SURGEON:  John L. Dorothyann Gibbs, M.D.  ASSISTANT: 1. Jamelle Rushing, P.A.C. 2. Arnoldo Morale, P.A.S.  ANESTHESIA:  General.  PATHOLOGY:  The patient has end-stage osteoarthritis that has been treated conservatively for one year with complete failure and miserable pain on a regular basis.  DESCRIPTION OF PROCEDURE:  Under general anesthesia, the patient was placed in the left lateral decubitus position.  The right hip was prepared with DuraPrep and draped as a sterile field.  A posterior approach was made, splitting the iliotibial band in the line of its fibers, inserting a  Charnley retractor. The short external rotators and hip capsule were taken down from the femur with electrocautery.  The hip capsule was then opened in a T-shaped manner and the hip was dislocated.  The femoral head was examined and has eburnated bare bone with peeling hyaline cartilage all around the edge of the eburnated area.  Following dislocation, a Muellers placed inferiorly and a Bennett medially on the femoral neck.  The IM initiator was used.  The canal finder was used.  The canal was progressively reamed to 14.5 mm which has excellent chatter of the reamer in the canal.  The femoral neck was then osteotomized.  The femur was progressively rasped 10.5, 12, 13.5 and 15.  The 15 narrow had an excellent fit.  At this point with the femur prepared, attention was turned to the acetabulum.  The surgeon  moved to the opposite side of the table.  It was exposed with two cobras inferiorly and two wing retractors superiorly.  The sciatic nerve was observed, protected and moved out of the way.  The hip capsule was preserved.  The labrum of the hip of the acetabulum was excised as was the ligamentum teres.  The acetabulum was progressively reamed from  47 to 49 to 51 and at this point a 51 fits quite well.  It was deepened down to the bottom of the fovea and at this point the trial seating of a 50 revealed good fit.  A size 52 trispike Pinnacle acetabulum was inserted using the external guide to assist positioning.  At this point trials of a 10 degree lip poly were used and several different neck lengths.  It was decided that the plus 9 neck length works best with anteversion on a 32 mm hip ball.  This  gives excellent fit, alignment and stability but it was due to some impingement in internal rotation of the greater trochanter on the pelvis, a +4 acetabulum was used to lateralize the femur and with this in place, stability was improved dramatically.  Permanent components were then obtained for the acetabulum.  An Apex hole eliminator was then used.  The +4 52 poly with 10 degree angle was then inserted using the Marathon polyethylene.  The femoral component was then inserted.  It was a  prodigy 15 mm diameter with Redux screw.  It was inserted and seated nicely on the calcar.  A +9 32 mm hip ball was inserted with the hip positioned.  It was stable through normal range of motion.  Leg length was felt to be quite symmetrical with the other leg.  The hip was washed out with antibiotic solution.  The capsule was closed with #1 Surgidac.  The short external rotators were reattached with #1 Surgidac. The iliotibial band closed with #1 Surgidac, subcutaneous with 0 and #1 and 2-0 Vicryl and the skin with clips.  Operative time was about an hour and 10 minutes and blood loss about 250 cc.  The patient  tolerated the procedure well and returned to recovery in good condition. Dictated by:   Carlisle Beers. Dorothyann Gibbs, M.D. Attending Physician:  Carolan Shiver Ii DD:  12/31/01 TD:  12/31/01 Job: [id number]

## 2011-03-24 ENCOUNTER — Encounter: Payer: Self-pay | Admitting: Internal Medicine

## 2011-03-25 ENCOUNTER — Other Ambulatory Visit (HOSPITAL_COMMUNITY): Payer: Self-pay | Admitting: Internal Medicine

## 2011-03-25 ENCOUNTER — Encounter: Payer: Self-pay | Admitting: Internal Medicine

## 2011-03-25 ENCOUNTER — Ambulatory Visit (INDEPENDENT_AMBULATORY_CARE_PROVIDER_SITE_OTHER): Payer: Medicare Other | Admitting: Internal Medicine

## 2011-03-25 VITALS — BP 110/60 | HR 78 | Ht 65.0 in | Wt 159.0 lb

## 2011-03-25 DIAGNOSIS — R933 Abnormal findings on diagnostic imaging of other parts of digestive tract: Secondary | ICD-10-CM

## 2011-03-25 DIAGNOSIS — R1319 Other dysphagia: Secondary | ICD-10-CM

## 2011-03-25 DIAGNOSIS — Z800 Family history of malignant neoplasm of digestive organs: Secondary | ICD-10-CM

## 2011-03-25 DIAGNOSIS — K222 Esophageal obstruction: Secondary | ICD-10-CM

## 2011-03-25 MED ORDER — PEG-KCL-NACL-NASULF-NA ASC-C 100 G PO SOLR: 1.0000 | Freq: Once | ORAL | Status: DC

## 2011-03-25 NOTE — Patient Instructions (Addendum)
You have been scheduled for an endoscopy and colonoscopy. Please follow the written instructions given to you at your visit today. Please pick up your Moviprep at the pharmacy within the next 2-3 days. You have been scheduled for a modified barium swallow on Tuesday, 04/19/11 at 10:00 am. Please arrive 15 minutes prior to registration. Go to [facility] Radiology (1st floor). Make certain to refrain from eating or drinking 4 hours prior to test.  [id number]:[id number] Joan Mayans, [facility]

## 2011-03-25 NOTE — Progress Notes (Signed)
Tina [name] 1930-10-08 MRN [id number]    History of Present Illness:  This is a 77 year old white female with problems swallowing solids and liquids. The problems have been chronic. In 2008, she was hospitalized after an episode which followed severe coughing and choking with pills causing respiratory distress and aspiration pneumonia. She underwent an upper endoscopy and dilatation showing a mild esophageal stricture. She reports only marginal improvement after dilatation. A barium swallow at that time showed a long delay in the passage of the 12.5 mm tablet. She now has coughing at night as well as after meals. She has had several episodes of regurgitation. She has food backing up into her esophagus and mouth at night. She has been on Protonix 40 mg a day. Her hemoglobin is 12.3, hematocrit is 36.8. An upper abdominal ultrasound in 2008 showed a normal gallbladder. She is on Celexa 20 mg a day.   Past Medical History  Diagnosis Date  . TIA (transient ischemic attack)   . Diverticulosis   . Cystocele   . Hypertension   . Heartburn   . Canker sore   . Depression   . Esophageal stricture   . Esophageal dysmotility    Past Surgical History  Procedure Date  . Total hip arthroplasty 6/03  . Partial colectomy 09/1991    due to diverticulitis  . Appendectomy   . Cataract extraction     reports that she quit smoking about 47 years ago. She has never used smokeless tobacco. She reports that she does not drink alcohol or use illicit drugs. family history includes Cancer in her father, paternal aunt, and paternal uncle; Diabetes in her father and sister; Heart disease in her brother, father, and sister; and Leukemia in her sister. Allergies  Allergen Reactions  . Ciprofloxacin Hcl     cipro  . Prednisone     REACTION: Rash        Review of Systems: Positive for dysphagia and odynophagia. Denies shortness of breath or chest pain. Positive for constipation negative for rectal  bleeding.  The remainder of the 10  point ROS is negative except as outlined in H&P   Physical Exam: General appearance  Well developed in no distress, normal voice. No coughing. Eyes- non icteric. HEENT nontraumatic, normocephalic. Mouth no lesions, tongue papillated, no cheilosis. Neck supple without adenopathy, thyroid not enlarged, no carotid bruits, no JVD. Lungs Clear to auscultation bilaterally. Cor normal S1 normal S2, regular rhythm , no murmur,  quiet precordium. Abdomen soft nontender abdomen but minimal discomfort in the subxiphoid area in the midline. The lower abdomen is unremarkable. Normal active bowel sounds. Liver edge at costal margin . Rectal: Soft Hemoccult negative stool. Extremities no pedal edema. Skin no lesions. Neurological alert and oriented x 3. Psychological normal mood and affect.  Assessment and Plan:  Problem #1 dysphagia and odynophagia. Patient has a history of a mild esophageal stricture which was dilated in 2008 with only modest improvement. I suspect esophageal dysmotility is a contributing factor. Often choking leads to aspiration. She has a history of aspiration pneumonia. She will need a modified barium swallow with speech pathologist to determine her risks for recurrent aspiration. We will also proceed with upper endoscopy and dilatation and ask her to continue on Protonix 40 mg a day as well as antireflux measures.  Problem #2 There is a family history of colon cancer in her brother. Her last colonoscopy in 2003 showed extensive diverticulosis. We will schedule a colonoscopy at the same time.  03/25/2011 Lina Sar

## 2011-04-15 ENCOUNTER — Encounter: Payer: Medicare Other | Admitting: Internal Medicine

## 2011-04-15 ENCOUNTER — Ambulatory Visit (HOSPITAL_COMMUNITY)
Admission: RE | Admit: 2011-04-15 | Discharge: 2011-04-15 | Disposition: A | Discharge status: Home / Self Care | Payer: Medicare Other | Source: Ambulatory Visit | Attending: Internal Medicine | Admitting: Internal Medicine

## 2011-04-15 DIAGNOSIS — K222 Esophageal obstruction: Secondary | ICD-10-CM

## 2011-04-15 DIAGNOSIS — Q438 Other specified congenital malformations of intestine: Secondary | ICD-10-CM | POA: Insufficient documentation

## 2011-04-15 DIAGNOSIS — R1319 Other dysphagia: Secondary | ICD-10-CM

## 2011-04-15 DIAGNOSIS — Z1211 Encounter for screening for malignant neoplasm of colon: Secondary | ICD-10-CM

## 2011-04-15 DIAGNOSIS — R1310 Dysphagia, unspecified: Secondary | ICD-10-CM | POA: Insufficient documentation

## 2011-04-15 DIAGNOSIS — Z800 Family history of malignant neoplasm of digestive organs: Secondary | ICD-10-CM | POA: Insufficient documentation

## 2011-04-15 DIAGNOSIS — K5730 Diverticulosis of large intestine without perforation or abscess without bleeding: Secondary | ICD-10-CM | POA: Insufficient documentation

## 2011-04-19 ENCOUNTER — Ambulatory Visit (HOSPITAL_COMMUNITY)
Admission: RE | Admit: 2011-04-19 | Discharge: 2011-04-19 | Disposition: A | Discharge status: Home / Self Care | Payer: Medicare Other | Source: Ambulatory Visit | Attending: Internal Medicine | Admitting: Internal Medicine

## 2011-04-19 DIAGNOSIS — R1310 Dysphagia, unspecified: Secondary | ICD-10-CM | POA: Insufficient documentation

## 2011-04-19 DIAGNOSIS — R05 Cough: Secondary | ICD-10-CM | POA: Insufficient documentation

## 2011-04-19 DIAGNOSIS — R933 Abnormal findings on diagnostic imaging of other parts of digestive tract: Secondary | ICD-10-CM

## 2011-04-19 DIAGNOSIS — K222 Esophageal obstruction: Secondary | ICD-10-CM

## 2011-04-19 DIAGNOSIS — R1319 Other dysphagia: Secondary | ICD-10-CM

## 2011-04-19 DIAGNOSIS — R059 Cough, unspecified: Secondary | ICD-10-CM | POA: Insufficient documentation

## 2011-04-29 ENCOUNTER — Encounter: Payer: Self-pay | Admitting: Internal Medicine

## 2011-07-28 ENCOUNTER — Emergency Department (INDEPENDENT_AMBULATORY_CARE_PROVIDER_SITE_OTHER): Payer: Medicare Other

## 2011-07-28 ENCOUNTER — Emergency Department (HOSPITAL_BASED_OUTPATIENT_CLINIC_OR_DEPARTMENT_OTHER)
Admission: EM | Admit: 2011-07-28 | Discharge: 2011-07-28 | Disposition: A | Discharge status: Home / Self Care | Payer: Medicare Other | Attending: Emergency Medicine | Admitting: Emergency Medicine

## 2011-07-28 ENCOUNTER — Encounter (HOSPITAL_BASED_OUTPATIENT_CLINIC_OR_DEPARTMENT_OTHER): Payer: Self-pay

## 2011-07-28 DIAGNOSIS — Y9289 Other specified places as the place of occurrence of the external cause: Secondary | ICD-10-CM | POA: Insufficient documentation

## 2011-07-28 DIAGNOSIS — Z79899 Other long term (current) drug therapy: Secondary | ICD-10-CM | POA: Insufficient documentation

## 2011-07-28 DIAGNOSIS — W19XXXA Unspecified fall, initial encounter: Secondary | ICD-10-CM

## 2011-07-28 DIAGNOSIS — M25559 Pain in unspecified hip: Secondary | ICD-10-CM | POA: Insufficient documentation

## 2011-07-28 DIAGNOSIS — IMO0002 Reserved for concepts with insufficient information to code with codable children: Secondary | ICD-10-CM

## 2011-07-28 DIAGNOSIS — I10 Essential (primary) hypertension: Secondary | ICD-10-CM | POA: Insufficient documentation

## 2011-07-28 DIAGNOSIS — M25469 Effusion, unspecified knee: Secondary | ICD-10-CM

## 2011-07-28 DIAGNOSIS — M25552 Pain in left hip: Secondary | ICD-10-CM

## 2011-07-28 DIAGNOSIS — Z8679 Personal history of other diseases of the circulatory system: Secondary | ICD-10-CM | POA: Insufficient documentation

## 2011-07-28 DIAGNOSIS — W010XXA Fall on same level from slipping, tripping and stumbling without subsequent striking against object, initial encounter: Secondary | ICD-10-CM | POA: Insufficient documentation

## 2011-07-28 DIAGNOSIS — S7000XA Contusion of unspecified hip, initial encounter: Secondary | ICD-10-CM

## 2011-07-28 DIAGNOSIS — M2390 Unspecified internal derangement of unspecified knee: Secondary | ICD-10-CM

## 2011-07-28 DIAGNOSIS — M25569 Pain in unspecified knee: Secondary | ICD-10-CM

## 2011-07-28 DIAGNOSIS — S62609A Fracture of unspecified phalanx of unspecified finger, initial encounter for closed fracture: Secondary | ICD-10-CM | POA: Insufficient documentation

## 2011-07-28 MED ORDER — HYDROCODONE-ACETAMINOPHEN 5-500 MG PO TABS: 1.0000 | ORAL_TABLET | Freq: Four times a day (QID) | ORAL | Status: AC | PRN

## 2011-07-28 NOTE — ED Notes (Signed)
Pt c/o fall at home on brick sidewalk.  Pt c/o L hand, arm, thigh and knee pain.

## 2011-07-28 NOTE — ED Notes (Signed)
Pt states will use walker at home

## 2011-07-28 NOTE — ED Provider Notes (Signed)
History     CSN: [id number]  Arrival date & time 07/28/11  1633   First MD Initiated Contact with Patient 07/28/11 1738      Chief Complaint  Patient presents with  . Fall    (Consider location/radiation/quality/duration/timing/severity/associated sxs/prior treatment) Patient is a 77 y.o. female presenting with fall. The history is provided by the patient.  Fall The accident occurred 1 to 2 hours ago. Pertinent negatives include no fever, no abdominal pain and no headaches.   patient states she was walking out of her sidewalk and tripped and fell. She landed on her left side. No loss of consciousness. She states she has pain in her left hand left hip and left knee. She states she felt anxious and felt like she may pass out after it happened. No chest pain. No abdominal pain. She states she does walk somewhat since this happened. She's not on any blood thinners. No headache. She thinks she may have hit her face, she states it was on the pine straw.  Past Medical History  Diagnosis Date  . TIA (transient ischemic attack)   . Diverticulosis   . Cystocele   . Hypertension   . Heartburn   . Canker sore   . Depression   . Esophageal stricture   . Esophageal dysmotility     Past Surgical History  Procedure Date  . Total hip arthroplasty 6/03  . Partial colectomy 09/1991    due to diverticulitis  . Appendectomy   . Cataract extraction     Family History  Problem Relation Age of Onset  . Cancer Paternal Aunt   . Cancer Paternal Uncle   . Diabetes Father   . Heart disease Father   . Cancer Father   . Diabetes Sister   . Heart disease Sister   . Leukemia Sister   . Heart disease Brother     History  Substance Use Topics  . Smoking status: Former Smoker    Quit date: 08/02/1963  . Smokeless tobacco: Never Used  . Alcohol Use: No    OB History    Grav Para Term Preterm Abortions TAB SAB Ect Mult Living                  Review of Systems  Constitutional:  Negative for fever and chills.  HENT: Negative for neck pain and neck stiffness.   Respiratory: Negative for choking.   Cardiovascular: Negative for chest pain.  Gastrointestinal: Negative for abdominal pain.  Genitourinary: Negative for flank pain.  Musculoskeletal: Positive for joint swelling. Negative for myalgias, back pain and gait problem.  Neurological: Negative for headaches.  Psychiatric/Behavioral: Negative for confusion.    Allergies  Ciprofloxacin hcl and Prednisone  Home Medications   Current Outpatient Rx  Name Route Sig Dispense Refill  . AMOXICILLIN-POT CLAVULANATE 875-125 MG PO TABS Oral Take 1 tablet by mouth every 12 (twelve) hours.      . ATORVASTATIN CALCIUM 10 MG PO TABS Oral Take 10 mg by mouth daily.      Marland Kitchen CITALOPRAM HYDROBROMIDE 20 MG PO TABS Oral Take 20 mg by mouth daily.      . GUAIFENESIN-DM 100-10 MG/5ML PO SYRP Oral Take 5 mLs by mouth at bedtime as needed. For cough      . MAGNESIUM HYDROXIDE 311 MG PO CHEW Oral Chew 311 mg by mouth daily as needed. For constipation     . ADULT MULTIVITAMIN W/MINERALS CH Oral Take 1 tablet by mouth daily.      Marland Kitchen  HAIR/SKIN/NAILS PO TABS Oral Take 1 tablet by mouth daily.      Marland Kitchen PANTOPRAZOLE SODIUM 40 MG PO TBEC Oral Take 40 mg by mouth daily.      Marland Kitchen POLYETHYLENE GLYCOL 3350 PO POWD Oral Take 17 g by mouth daily as needed. For constipation      . TRIAMCINOLONE ACETONIDE 0.1 % MT PSTE dental Place 1 application onto teeth 2 (two) times daily as needed. For sores     . VALTREX PO Oral Take 1 tablet by mouth 2 (two) times daily.      Marland Kitchen ZOLPIDEM TARTRATE 5 MG PO TABS Oral Take 2.5 mg by mouth at bedtime as needed. For sleep     . HYDROCODONE-ACETAMINOPHEN 5-500 MG PO TABS Oral Take 1-2 tablets by mouth every 6 (six) hours as needed for pain. 15 tablet 0    BP 146/56  Pulse 68  Temp(Src) 98.8 F (37.1 C) (Oral)  Resp 18  Ht 5' 3.5" (1.613 m)  Wt 157 lb (71.215 kg)  BMI 27.38 kg/m2  SpO2 100%  Physical Exam    Constitutional: She is oriented to person, place, and time. She appears well-developed and well-nourished.  HENT:  Head: Normocephalic.       Mild redness to left cheek. No crepitance or deformity.  Eyes: Conjunctivae are normal. Pupils are equal, round, and reactive to light.  Neck: Normal range of motion. Neck supple.  Cardiovascular: Normal rate and regular rhythm.   Pulmonary/Chest: Effort normal.  Abdominal: Soft. Bowel sounds are normal.  Musculoskeletal: She exhibits tenderness. She exhibits no edema.       Tenderness to left anterior knee, with abrasion. Neurovascularly intact distally. Range of motion intact. Mild tenderness left hip. Range of motion intact. Tenderness and ecchymosis with swelling to left little finger and left MCP joint. No crepitance.  Neurological: She is oriented to person, place, and time.  Skin: Skin is warm.    ED Course  Procedures (including critical care time)  Labs Reviewed - No data to display Dg Hip Complete Left  07/28/2011  *RADIOLOGY REPORT*  Clinical Data: Fall on left side today.  Bruising.  LEFT HIP - COMPLETE 2+ VIEW  Comparison: CT 09/11/2007.  Findings: AP view pelvis demonstrates a right hip arthroplasty. Mild osteopenia.  Sacroiliac joints are symmetric.  2 views of the left hip demonstrate subtle osseous irregularity at the femoral head/neck junction.  When compared to the coronal reformats from the 09/11/2007 CT, much of this is felt to be degenerative.  IMPRESSION: Subtle irregularity at the left femoral head/neck junction. Favored to at least partially be degenerative.  If there is a high clinical concern of acute fracture, consider dedicated MRI.  Status post right hip arthroplasty, without acute finding.  Mild osteopenia.  Original Report Authenticated By: Consuello Bossier, M.D.   Dg Knee Complete 4 Views Left  07/28/2011  *RADIOLOGY REPORT*  Clinical Data: 77 year old female - left knee pain following fall.  LEFT KNEE - COMPLETE 4+  VIEW  Comparison: None  Findings: A moderate joint effusion is present. There is no evidence of fracture, subluxation or dislocation. No focal bony lesions are identified. Minimal degenerative changes in the medial and patellofemoral compartments noted.  IMPRESSION: Joint effusion without acute bony abnormality identified.  Minimal degenerative changes.  Original Report Authenticated By: Rosendo Gros, M.D.   Dg Hand Complete Left  07/28/2011  *RADIOLOGY REPORT*  Clinical Data: 77 year old female with left hand pain following fall.  LEFT HAND - COMPLETE 3+  VIEW  Comparison: None  Findings: An equivocal nondisplaced fracture of the proximal aspect of the little finger proximal phalanx is noted.  No other fracture, subluxation or dislocation identified. No unexpected radiopaque foreign bodies are present. No focal bony lesions are noted.  IMPRESSION: Equivocal nondisplaced fracture of the little finger proximal phalanx - correlate clinically with area of pain.  No other acute bony abnormalities identified.  Original Report Authenticated By: Rosendo Gros, M.D.     1. Fall   2. Finger fracture   3. Knee derangement   4. Hip pain, left       MDM  Fall. Apparent finger fracture. Likely derangement left knee. No fracture on x-ray, but has effusion. X-ray of left hip shows some irregularity. Thought to be degenerative by radiology, but cannot rule out fracture. Patient was able to ambulate without much pain on the hip. She'll followup with Dr. Priscille Kluver her orthopedic surgeon. She was informed that a left hip fracture is not definitively been ruled out        [employer]. Rubin Payor, MD 07/29/11 [phone]

## 2011-07-28 NOTE — ED Notes (Signed)
Pt denies LOC 

## 2012-02-08 DIAGNOSIS — R1310 Dysphagia, unspecified: Secondary | ICD-10-CM | POA: Insufficient documentation

## 2012-02-08 DIAGNOSIS — K5730 Diverticulosis of large intestine without perforation or abscess without bleeding: Secondary | ICD-10-CM | POA: Insufficient documentation

## 2013-08-26 DIAGNOSIS — K219 Gastro-esophageal reflux disease without esophagitis: Secondary | ICD-10-CM | POA: Insufficient documentation

## 2016-01-19 ENCOUNTER — Ambulatory Visit: Payer: Medicare Other | Attending: Family Medicine | Admitting: Physical Therapy

## 2016-01-19 ENCOUNTER — Encounter: Payer: Self-pay | Admitting: Physical Therapy

## 2016-01-19 DIAGNOSIS — R262 Difficulty in walking, not elsewhere classified: Secondary | ICD-10-CM | POA: Insufficient documentation

## 2016-01-19 DIAGNOSIS — M25551 Pain in right hip: Secondary | ICD-10-CM | POA: Insufficient documentation

## 2016-01-19 DIAGNOSIS — M25552 Pain in left hip: Secondary | ICD-10-CM | POA: Insufficient documentation

## 2016-01-19 NOTE — Therapy (Signed)
[facility] [address] [place], [place], [address] Phone: [phone]   Fax:  [phone]  Physical Therapy Evaluation  Patient Details  Name: Tina [name] MRN: [id number] Date of Birth: 05-29-1931 Referring Provider: Everlene Other  Encounter Date: 01/19/2016      PT End of Session - 01/19/16 1339    Visit Number 1   Date for PT Re-Evaluation 03/20/16   PT Start Time 1305   PT Stop Time 1400   PT Time Calculation (min) 55 min   Activity Tolerance Patient tolerated treatment well   Behavior During Therapy [facility] for tasks assessed/performed      Past Medical History  Diagnosis Date  . TIA (transient ischemic attack)   . Diverticulosis   . Cystocele   . Hypertension   . Heartburn   . Canker sore   . Depression   . Esophageal stricture   . Esophageal dysmotility     Past Surgical History  Procedure Laterality Date  . Total hip arthroplasty  6/03  . Partial colectomy  09/1991    due to diverticulitis  . Appendectomy    . Cataract extraction      There were no vitals filed for this visit.       Subjective Assessment - 01/19/16 1307    Subjective Patient reports some increased hip pain bilaterally, she is to have a left THR 03/30/16.  She had a right THR 12-13 years ago.  She reports just some pain over the past year.  She has had a fall in the past 6 months.   Limitations Lifting;Standing;Walking;House hold activities   Patient Stated Goals have less pain, get ready for the surgery, be more stable walking   Currently in Pain? Yes   Pain Score 4    Pain Location Hip   Pain Orientation Right;Left   Pain Descriptors / Indicators Aching   Pain Type Chronic pain   Pain Onset More than a month ago   Pain Frequency Constant   Aggravating Factors  standing and walking, lifting leg up (hip flexion)  up to 8-9/10, left is worse than the right   Pain Relieving Factors rest   Effect of Pain on Daily Activities  difficult with all ADL's            [facility] PT Assessment - 01/19/16 0001    Assessment   Medical Diagnosis left hip pain, right hip pain, difficulty walking   Referring Provider Bouska   Onset Date/Surgical Date 12/19/15   Prior Therapy no   Precautions   Precautions None   Balance Screen   Has the patient fallen in the past 6 months Yes   How many times? 1   Has the patient had a decrease in activity level because of a fear of falling?  No   Is the patient reluctant to leave their home because of a fear of falling?  No   Home Environment   Additional Comments a few steps in the home, does some housework and yardwork   Prior Function   Level of Independence Independent   Vocation Retired   Leisure no exercise   Press photographer Comments fwd head, rounded shoulders   ROM / Strength   AROM / PROM / Strength AROM;Strength   AROM   AROM Assessment Site Hip   Right/Left Hip Right;Left   Right Hip Flexion 70   Right Hip External Rotation  0   Right Hip Internal  Rotation  0   Right Hip ABduction 15   Left Hip Flexion 60   Left Hip External Rotation  10   Left Hip Internal Rotation  4   Left Hip ABduction 10   Strength   Overall Strength Comments 3+/5 for both hips with pain in the hips and into the thighs   Palpation   Palpation comment she is very tender in the anterior left hip and the thigh   Ambulation/Gait   Gait Comments uses a SPC, very slow and severe antalgic gait on the left the first few steps   Standardized Balance Assessment   Standardized Balance Assessment Timed Up and Go Test   Timed Up and Go Test   Normal TUG (seconds) 36                   [facility] Adult PT Treatment/Exercise - 01/19/16 0001    Modalities   Modalities Moist Heat;Electrical Stimulation   Moist Heat Therapy   Number Minutes Moist Heat 15 Minutes   Moist Heat Location Hip   Electrical Stimulation   Electrical Stimulation Location left hip   Electrical  Stimulation Action IFC   Electrical Stimulation Parameters supine   Electrical Stimulation Goals Pain                PT Education - 01/19/16 1337    Education provided Yes   Education Details hip exercises (basic/supine)   Person(s) Educated Patient   Methods Explanation;Demonstration   Comprehension Verbalized understanding          PT Short Term Goals - 01/19/16 1343    PT SHORT TERM GOAL #1   Title independent with HEP   Time 1   Period Weeks   Status New           PT Long Term Goals - 01/19/16 1343    PT LONG TERM GOAL #1   Title report 50% easier to get in and out of car, at eval she was using her arms to lift legs into the car   Time 8   Period Weeks   Status New   PT LONG TERM GOAL #2   Title decrease TUG time to 20 seconds   Time 8   Period Weeks   Status New   PT LONG TERM GOAL #3   Title reports pain decreased 25%   Time 8   Period Weeks   Status New   PT LONG TERM GOAL #4   Title increase hip strength to 4-/5   Time 8   Period Weeks   Status New               Plan - 01/19/16 1339    Clinical Impression Statement Patient reports bilateral hip pain, had the right hip replaced about 12 years ago, is going to have the left hip replaced in about 2 months.  She does report being unsteady with walking especially when she first gets up from sitting   Rehab Potential Good   PT Frequency 2x / week   PT Duration 8 weeks   PT Treatment/Interventions ADLs/Self Care Home Management;Electrical Stimulation;Moist Heat;Therapeutic exercise;Therapeutic activities;Functional mobility training;Gait training;Ultrasound;Balance training;Neuromuscular re-education;Patient/family education;Manual techniques;Passive range of motion   PT Next Visit Plan slowly add exercises for ROM, strength and balance   Consulted and Agree with Plan of Care Patient      Patient will benefit from skilled therapeutic intervention in order to improve the following  deficits and impairments:  Abnormal gait,  Decreased activity tolerance, Decreased balance, Decreased mobility, Decreased range of motion, Difficulty walking, Decreased strength, Impaired flexibility, Pain  Visit Diagnosis: Pain in left hip - Plan: PT plan of care cert/re-cert  Pain in right hip - Plan: PT plan of care cert/re-cert  Difficulty in walking, not elsewhere classified - Plan: PT plan of care cert/re-cert      G-Codes - 01/19/16 1345    Functional Assessment Tool Used foto 62% limitation   Functional Limitation Mobility: Walking and moving around   Mobility: Walking and Moving Around Current Status [phone](G8978) At least 60 percent but less than 80 percent impaired, limited or restricted   Mobility: Walking and Moving Around Goal Status ([id number]) At least 40 percent but less than 60 percent impaired, limited or restricted       Problem List There are no active problems to display for this patient.   Jearld LeschALBRIGHT,Aleathea Pugmire W., PT 01/19/2016, 1:51 PM  [facility] [address] [place], [place], [address] Phone: [phone]   Fax:  [phone]  Name: Tina [name] MRN: [id number] Date of Birth: 06/10/1931

## 2016-01-21 ENCOUNTER — Encounter: Payer: Self-pay | Admitting: Physical Therapy

## 2016-01-21 ENCOUNTER — Ambulatory Visit: Payer: Medicare Other | Admitting: Physical Therapy

## 2016-01-21 DIAGNOSIS — M25551 Pain in right hip: Secondary | ICD-10-CM

## 2016-01-21 DIAGNOSIS — R262 Difficulty in walking, not elsewhere classified: Secondary | ICD-10-CM

## 2016-01-21 DIAGNOSIS — M25552 Pain in left hip: Secondary | ICD-10-CM | POA: Diagnosis not present

## 2016-01-21 NOTE — Therapy (Signed)
[facility] [address] [place], [place], [address] Phone: [phone]   Fax:  [phone]  Physical Therapy Treatment  Patient Details  Name: Tina ConferLouise T Penning MRN: [id number] Date of Birth: 03/04/1931 Referring Provider: Everlene OtherBouska  Encounter Date: 01/21/2016      PT End of Session - 01/21/16 1731    Visit Number 2   Date for PT Re-Evaluation 03/20/16   PT Start Time 1650   PT Stop Time 1749   PT Time Calculation (min) 59 min   Activity Tolerance Patient tolerated treatment well   Behavior [facility] Therapy [facility] for tasks assessed/performed;Anxious      Past Medical History  Diagnosis Date  . TIA (transient ischemic attack)   . Diverticulosis   . Cystocele   . Hypertension   . Heartburn   . Canker sore   . Depression   . Esophageal stricture   . Esophageal dysmotility     Past Surgical History  Procedure Laterality Date  . Total hip arthroplasty  6/03  . Partial colectomy  09/1991    due to diverticulitis  . Appendectomy    . Cataract extraction      There were no vitals filed for this visit.      Subjective Assessment - 01/21/16 1701    Subjective Reports some increased pain after doing some yardwork today.  She is limping a little more today on the right   Currently in Pain? Yes   Pain Score 5    Pain Location Hip   Pain Orientation Right;Left                         [facility] Adult PT Treatment/Exercise - 01/21/16 0001    High Level Balance   High Level Balance Activities Backward walking;Side stepping   Exercises   Exercises Knee/Hip   Knee/Hip Exercises: Aerobic   Nustep level 3 x 5 minutes   Knee/Hip Exercises: Machines for Strengthening   Cybex Knee Flexion 20# 2x10   Cybex Leg Press 5# 3x5 reps   Total Gym Leg Press 20# x10, no weight 2x10   Moist Heat Therapy   Number Minutes Moist Heat 15 Minutes   Moist Heat Location Hip   Electrical Stimulation   Electrical  Stimulation Location left hip   Electrical Stimulation Action IFC   Electrical Stimulation Parameters supine   Electrical Stimulation Goals Pain                  PT Short Term Goals - 01/19/16 1343    PT SHORT TERM GOAL #1   Title independent with HEP   Time 1   Period Weeks   Status New           PT Long Term Goals - 01/19/16 1343    PT LONG TERM GOAL #1   Title report 50% easier to get in and out of car, at eval she was using her arms to lift legs into the car   Time 8   Period Weeks   Status New   PT LONG TERM GOAL #2   Title decrease TUG time to 20 seconds   Time 8   Period Weeks   Status New   PT LONG TERM GOAL #3   Title reports pain decreased 25%   Time 8   Period Weeks   Status New   PT LONG TERM GOAL #4   Title increase hip strength to  4-/5   Time 8   Period Weeks   Status New               Plan - 01/21/16 1732    Clinical Impression Statement Patient with a little fear for most exercises, seemed to be anticipating pain, but she did well with minimal c/o pain wiht the exercises   PT Next Visit Plan slowly add exercises for ROM, strength and balance, will just have to go slow as she is afraid of hurting   Consulted and Agree with Plan of Care Patient      Patient will benefit from skilled therapeutic intervention in order to improve the following deficits and impairments:  Abnormal gait, Decreased activity tolerance, Decreased balance, Decreased mobility, Decreased range of motion, Difficulty walking, Decreased strength, Impaired flexibility, Pain  Visit Diagnosis: Pain in left hip  Pain in right hip  Difficulty in walking, not elsewhere classified     Problem List There are no active problems to display for this patient.   Jearld LeschALBRIGHT,Gladis Soley W., PT 01/21/2016, 5:33 PM  [facility] [address] [place], [place], [address] Phone: [phone]   Fax:   [phone]  Name: Tina ConferLouise T [name] MRN: [id number] Date of Birth: 08/31/1930

## 2016-01-26 ENCOUNTER — Encounter: Payer: Self-pay | Admitting: Physical Therapy

## 2016-01-26 ENCOUNTER — Ambulatory Visit: Payer: Medicare Other | Admitting: Physical Therapy

## 2016-01-26 DIAGNOSIS — M25552 Pain in left hip: Secondary | ICD-10-CM | POA: Diagnosis not present

## 2016-01-26 DIAGNOSIS — M25551 Pain in right hip: Secondary | ICD-10-CM

## 2016-01-26 DIAGNOSIS — R262 Difficulty in walking, not elsewhere classified: Secondary | ICD-10-CM

## 2016-01-26 NOTE — Therapy (Signed)
[facility] [address] [place], [place], [address] Phone: [phone]   Fax:  [phone]  Physical Therapy Treatment  Patient Details  Name: Tina [name] MRN: [id number] Date of Birth: 03/03/1931 Referring Provider: Everlene OtherBouska  Encounter Date: 01/26/2016      PT End of Session - 01/26/16 1524    Visit Number 3   Date for PT Re-Evaluation 03/20/16   PT Start Time 1439   PT Stop Time 1530   PT Time Calculation (min) 51 min   Activity Tolerance Patient tolerated treatment well   Behavior During Therapy [facility] for tasks assessed/performed;Anxious      Past Medical History  Diagnosis Date  . TIA (transient ischemic attack)   . Diverticulosis   . Cystocele   . Hypertension   . Heartburn   . Canker sore   . Depression   . Esophageal stricture   . Esophageal dysmotility     Past Surgical History  Procedure Laterality Date  . Total hip arthroplasty  6/03  . Partial colectomy  09/1991    due to diverticulitis  . Appendectomy    . Cataract extraction      There were no vitals filed for this visit.      Subjective Assessment - 01/26/16 1440    Subjective Reports that she is doing okay, maybe a little sore after treatments.   Currently in Pain? Yes   Pain Score 5    Pain Location Hip   Pain Orientation Right;Left   Pain Descriptors / Indicators Aching   Aggravating Factors  standing and walking   Pain Relieving Factors rest                         [facility] Adult PT Treatment/Exercise - 01/26/16 0001    Knee/Hip Exercises: Aerobic   Nustep Level 4 x 5 minutes   Knee/Hip Exercises: Machines for Strengthening   Cybex Knee Extension 5# 2x10   Cybex Knee Flexion 20# 2x10   Total Gym Leg Press 20# x10, no weight 2x10   Knee/Hip Exercises: Standing   Heel Raises 20 reps   Hip Flexion 20 reps   Hip Flexion Limitations 2#   Hip Abduction 20 reps   Abduction Limitations 2#   Knee/Hip  Exercises: Seated   Ball Squeeze 20 reps   Clamshell with [employer]   Sit to Sand 5 reps;without UE support   Knee/Hip Exercises: Supine   Other Supine Knee/Hip Exercises ffet on ball K2C, trunk rotation and small bridges                  PT Short Term Goals - 01/26/16 1526    PT SHORT TERM GOAL #1   Title independent with HEP   Status Achieved           PT Long Term Goals - 01/19/16 1343    PT LONG TERM GOAL #1   Title report 50% easier to get in and out of car, at eval she was using her arms to lift legs into the car   Time 8   Period Weeks   Status New   PT LONG TERM GOAL #2   Title decrease TUG time to 20 seconds   Time 8   Period Weeks   Status New   PT LONG TERM GOAL #3   Title reports pain decreased 25%   Time 8   Period Weeks   Status  New   PT LONG TERM GOAL #4   Title increase hip strength to 4-/5   Time 8   Period Weeks   Status New               Plan - 01/26/16 1525    Clinical Impression Statement Patient with slow motions due to pain in the hips, she had great difficulty with the bridges, needed to break this up into sets of 5    PT Next Visit Plan slowly add exercises for ROM, strength and balance, will just have to go slow as she is afraid of hurting   Consulted and Agree with Plan of Care Patient      Patient will benefit from skilled therapeutic intervention in order to improve the following deficits and impairments:  Abnormal gait, Decreased activity tolerance, Decreased balance, Decreased mobility, Decreased range of motion, Difficulty walking, Decreased strength, Impaired flexibility, Pain  Visit Diagnosis: Pain in left hip  Pain in right hip  Difficulty in walking, not elsewhere classified     Problem List There are no active problems to display for this patient.   Jearld LeschALBRIGHT,Josie Mesa W., PT 01/26/2016, 3:28 PM  [facility] [address] [place], [place], [address] Phone: [phone]   Fax:  [phone]  Name: Tina [name] MRN: [id number] Date of Birth: 08/07/1930

## 2016-01-27 ENCOUNTER — Encounter: Payer: Self-pay | Admitting: Physical Therapy

## 2016-01-27 ENCOUNTER — Ambulatory Visit: Payer: Medicare Other | Admitting: Physical Therapy

## 2016-01-27 DIAGNOSIS — M25552 Pain in left hip: Secondary | ICD-10-CM | POA: Diagnosis not present

## 2016-01-27 DIAGNOSIS — R262 Difficulty in walking, not elsewhere classified: Secondary | ICD-10-CM

## 2016-01-27 DIAGNOSIS — M25551 Pain in right hip: Secondary | ICD-10-CM

## 2016-01-27 NOTE — Therapy (Addendum)
[address] [place], [place], [address] Phone: [phone]   Fax:  [phone]  Physical Therapy Treatment  Patient Details  Name: Tina [name] MRN: [id number] Date of Birth: February 07, 1931 Referring Provider: Coletta Memos  Encounter Date: 01/27/2016      PT End of Session - 01/27/16 1644    Visit Number 4   Date for PT Re-Evaluation 03/20/16   PT Start Time 4818   PT Stop Time 1704   PT Time Calculation (min) 55 min   Activity Tolerance Patient tolerated treatment well   Behavior During Therapy [facility] for tasks assessed/performed;Anxious      Past Medical History  Diagnosis Date  . TIA (transient ischemic attack)   . Diverticulosis   . Cystocele   . Hypertension   . Heartburn   . Canker sore   . Depression   . Esophageal stricture   . Esophageal dysmotility     Past Surgical History  Procedure Laterality Date  . Total hip arthroplasty  6/03  . Partial colectomy  09/1991    due to diverticulitis  . Appendectomy    . Cataract extraction      There were no vitals filed for this visit.      Subjective Assessment - 01/27/16 1613    Subjective She reports that she is sore in the thighs today, I don't think I can do as much today   Currently in Pain? Yes   Pain Score 5    Pain Location Hip   Pain Orientation Right;Left   Pain Descriptors / Indicators Aching                         [facility] Adult PT Treatment/Exercise - 01/27/16 0001    High Level Balance   High Level Balance Activities Backward walking;Side stepping;Tandem walking   Knee/Hip Exercises: Aerobic   Nustep Level 4 x 5 minutes   Knee/Hip Exercises: Standing   Other Standing Knee Exercises seated IR of the hips with feet on pillow cases to slide easier   Knee/Hip Exercises: Seated   Long Arc Quad 20 reps   Long Arc Quad Weight 2 lbs.   Ball Squeeze 20 reps   Clamshell with [employer] Limitations 20  reps   Marching Weights 2 lbs.   Hamstring Curl 20 reps    Hamstring Weights 2 lbs.   Moist Heat Therapy   Number Minutes Moist Heat 15 Minutes   Moist Heat Location Hip   Electrical Stimulation   Electrical Stimulation Location left hip   Electrical Stimulation Action IFC   Electrical Stimulation Parameters supine   Electrical Stimulation Goals Pain                  PT Short Term Goals - 01/26/16 1526    PT SHORT TERM GOAL #1   Title independent with HEP   Status Achieved           PT Long Term Goals - 01/19/16 1343    PT LONG TERM GOAL #1   Title report 50% easier to get in and out of car, at eval she was using her arms to lift legs into the car   Time 8   Period Weeks   Status New   PT LONG TERM GOAL #2   Title decrease TUG time to 20 seconds   Time 8   Period Weeks   Status New  PT LONG TERM GOAL #3   Title reports pain decreased 25%   Time 8   Period Weeks   Status New   PT LONG TERM GOAL #4   Title increase hip strength to 4-/5   Time 8   Period Weeks   Status New               Plan - 01/27/16 1645    Clinical Impression Statement Reported soreness in the thighs, seems to be mostly mm soreness.  Tolerated the exercises today better and with less lethargy   PT Next Visit Plan slowly add exercises for ROM, strength and balance, will just have to go slow as she is afraid of hurting   Consulted and Agree with Plan of Care Patient      Patient will benefit from skilled therapeutic intervention in order to improve the following deficits and impairments:  Abnormal gait, Decreased activity tolerance, Decreased balance, Decreased mobility, Decreased range of motion, Difficulty walking, Decreased strength, Impaired flexibility, Pain  Visit Diagnosis: Pain in left hip  Pain in right hip  Difficulty in walking, not elsewhere classified     Problem List There are no active problems to display for this patient.   , W.,  PT 01/27/2016, 4:48 PM  [facility] [place] [address] [place], [place], [address] Phone: [phone]   Fax:  [phone]  Name: Tina [name] MRN: [id number] Date of Birth: 01/20/1931    PHYSICAL THERAPY DISCHARGE SUMMARY   Plan: Patient agrees to discharge.  Patient goals were partially met. Patient is being discharged due to lack of progress.  ?????      

## 2016-02-10 ENCOUNTER — Ambulatory Visit: Payer: Medicare Other | Admitting: Physical Therapy

## 2016-02-16 ENCOUNTER — Ambulatory Visit: Payer: Medicare Other | Admitting: Physical Therapy

## 2016-02-17 ENCOUNTER — Encounter (HOSPITAL_COMMUNITY)
Admission: RE | Admit: 2016-02-17 | Discharge: 2016-02-17 | Disposition: A | Discharge status: Home / Self Care | Payer: Medicare Other | Source: Ambulatory Visit | Attending: Orthopedic Surgery | Admitting: Orthopedic Surgery

## 2016-02-17 ENCOUNTER — Encounter (HOSPITAL_COMMUNITY): Payer: Self-pay

## 2016-02-17 DIAGNOSIS — Z01812 Encounter for preprocedural laboratory examination: Secondary | ICD-10-CM | POA: Insufficient documentation

## 2016-02-17 HISTORY — DX: Gastro-esophageal reflux disease without esophagitis: K21.9

## 2016-02-17 LAB — BASIC METABOLIC PANEL
Anion gap: 6 (ref 5–15)
BUN: 21 mg/dL — ABNORMAL HIGH (ref 6–20)
CALCIUM: 9.1 mg/dL (ref 8.9–10.3)
CO2: 30 mmol/L (ref 22–32)
Chloride: 106 mmol/L (ref 101–111)
Creatinine, Ser: 0.78 mg/dL (ref 0.44–1.00)
GFR calc Af Amer: >60 mL/min (ref >60)
GFR calc non Af Amer: >60 mL/min (ref >60)
Glucose, Bld: 91 mg/dL (ref 65–99)
Potassium: 5.6 mmol/L — ABNORMAL HIGH (ref 3.5–5.1)
SODIUM: 142 mmol/L (ref 135–145)

## 2016-02-17 LAB — CBC
HCT: 34.1 % — ABNORMAL LOW (ref 36.0–46.0)
HEMOGLOBIN: 10.5 g/dL — AB (ref 12.0–15.0)
MCH: 28.7 pg (ref 26.0–34.0)
MCHC: 30.8 g/dL (ref 30.0–36.0)
MCV: 93.2 fL (ref 78.0–100.0)
Platelets: 271 10*3/uL (ref 150–400)
RBC: 3.66 MIL/uL — AB (ref 3.87–5.11)
RDW: 13.2 % (ref 11.5–15.5)
WBC: 7.8 10*3/uL (ref 4.0–10.5)

## 2016-02-17 LAB — SURGICAL PCR SCREEN
MRSA, PCR: NEGATIVE
STAPHYLOCOCCUS AUREUS: NEGATIVE

## 2016-02-17 LAB — ABO/RH: ABO/RH(D): A POS

## 2016-02-17 NOTE — Progress Notes (Signed)
12/14/2015-EKG and Pre-operative clearance from Mariann Lasterebbie Smothers, FNP on chart.

## 2016-02-17 NOTE — Patient Instructions (Signed)
Tina [name]  02/17/2016   Your procedure is scheduled on: Wednesday 02/24/2016  [facility] to 3rd floor to  Short Stay Center at 0700  AM.  Call this number if you have problems the morning of surgery [phone]   Remember: ONLY 1 PERSON MAY GO WITH YOU TO SHORT STAY TO GET  READY MORNING OF YOUR SURGERY.   Do not eat food or drink liquids :After Midnight.     Take these medicines the morning of surgery with A SIP OF WATER: Citalopram, Atorvastatin                                 You may not have any metal on your body including hair pins and              piercings  Do not wear jewelry, make-up, lotions, powders or perfumes, deodorant             Do not wear nail polish.  Do not shave  48 hours prior to surgery.              Men may shave face and neck.   Do not bring valuables to the hospital. [facility] IS NOT             RESPONSIBLE   FOR VALUABLES.  Contacts, dentures or bridgework may not be worn into surgery.  Leave suitcase in the car. After surgery it may be brought to your room.                  Please read over the following fact sheets you were given: _____________________________________________________________________             [facility] - Preparing for [facility] Before surgery, you can play an important role.  Because skin is not sterile, your skin needs to be as free of germs as possible.  You can reduce the number of germs on your skin by washing with CHG (chlorahexidine gluconate) soap before surgery.  CHG is an antiseptic cleaner which kills germs and bonds with the skin to continue killing germs even after washing. Please DO NOT use if you have an allergy to CHG or antibacterial soaps.  If your skin becomes reddened/irritated stop using the CHG and inform your nurse when you arrive at Short Stay. Do not shave (including legs and underarms) for at least 48 hours prior to the first  CHG shower.  You may shave your face/neck. Please follow these instructions carefully:  1.  Shower with CHG Soap the night before surgery and the  morning of Surgery.  2.  If you choose to wash your hair, wash your hair first as usual with your  normal  shampoo.  3.  After you shampoo, rinse your hair and body thoroughly to remove the  shampoo.                           4.  Use CHG as you would any other liquid soap.  You can apply chg directly  to the skin and wash                       Gently with a scrungie or clean washcloth.  5.  Apply the CHG Soap to your body ONLY FROM THE NECK DOWN.   Do not use on face/ open                           Wound or open sores. Avoid contact with eyes, ears mouth and genitals (private parts).                       Wash face,  Genitals (private parts) with your normal soap.             6.  Wash thoroughly, paying special attention to the area where your surgery  will be performed.  7.  Thoroughly rinse your body with warm water from the neck down.  8.  DO NOT shower/wash with your normal soap after using and rinsing off  the CHG Soap.                9.  Pat yourself dry with a clean towel.            10.  Wear clean pajamas.            11.  Place clean sheets on your bed the night of your first shower and do not  sleep with pets. Day of Surgery : Do not apply any lotions/deodorants the morning of surgery.  Please wear clean clothes to the hospital/surgery center.  FAILURE TO FOLLOW THESE INSTRUCTIONS MAY RESULT IN THE CANCELLATION OF YOUR SURGERY PATIENT SIGNATURE_________________________________  NURSE SIGNATURE__________________________________  ________________________________________________________________________   Tina [name]  An incentive spirometer is a tool that can help keep your lungs clear and active. This tool measures how well you are filling your lungs with each breath. Taking long deep breaths may help reverse or decrease the  chance of developing breathing (pulmonary) problems (especially infection) following:  A long period of time when you are unable to move or be active. BEFORE THE PROCEDURE   If the spirometer includes an indicator to show your best effort, your nurse or respiratory therapist will set it to a desired goal.  If possible, sit up straight or lean slightly forward. Try not to slouch.  Hold the incentive spirometer in an upright position. INSTRUCTIONS FOR USE   Sit on the edge of your bed if possible, or sit up as far as you can in bed or on a chair.  Hold the incentive spirometer in an upright position.  Breathe out normally.  Place the mouthpiece in your mouth and seal your lips tightly around it.  Breathe in slowly and as deeply as possible, raising the piston or the ball toward the top of the column.  Hold your breath for 3-5 seconds or for as long as possible. Allow the piston or ball to fall to the bottom of the column.  Remove the mouthpiece from your mouth and breathe out normally.  Rest for a few seconds and repeat Steps 1 through 7 at least 10 times every 1-2 hours when you are awake. Take your time and take a few normal breaths between deep breaths.  The spirometer may include an indicator to show your best effort. Use the indicator as a goal to work toward during each repetition.  After each set of 10 deep breaths, practice coughing to be sure your lungs are clear. If you have an incision (the cut made at the time of surgery), support your incision when coughing by placing a pillow or  rolled up towels firmly against it. Once you are able to get out of bed, walk around indoors and cough well. You may stop using the incentive spirometer when instructed by your caregiver.  RISKS AND COMPLICATIONS  Take your time so you do not get dizzy or light-headed.  If you are in pain, you may need to take or ask for pain medication before doing incentive spirometry. It is harder to take a  deep breath if you are having pain. AFTER USE  Rest and breathe slowly and easily.  It can be helpful to keep track of a log of your progress. Your caregiver can provide you with a simple table to help with this. If you are using the spirometer at home, follow these instructions: [place] IF:   You are having difficultly using the spirometer.  You have trouble using the spirometer as often as instructed.  Your pain medication is not giving enough relief while using the spirometer.  You develop fever of 100.5 F (38.1 C) or higher. SEEK IMMEDIATE MEDICAL CARE IF:   You cough up bloody sputum that had not been present before.  You develop fever of 102 F (38.9 C) or greater.  You develop worsening pain at or near the incision site. MAKE SURE YOU:   Understand these instructions.  Will watch your condition.  Will get help right away if you are not doing well or get worse. Document Released: 11/28/2006 Document Revised: 10/10/2011 Document Reviewed: 01/29/2007 ExitCare Patient Information 2014 ExitCare, [place].   ________________________________________________________________________  WHAT IS A BLOOD TRANSFUSION? Blood Transfusion Information  A transfusion is the replacement of blood or some of its parts. Blood is made up of multiple cells which provide different functions.  Red blood cells carry oxygen and are used for blood loss replacement.  White blood cells fight against infection.  Platelets control bleeding.  Plasma helps clot blood.  Other blood products are available for specialized needs, such as hemophilia or other clotting disorders. BEFORE THE TRANSFUSION  Who gives blood for transfusions?   Healthy volunteers who are fully evaluated to make sure their blood is safe. This is blood bank blood. Transfusion therapy is the safest it has ever been in the practice of medicine. Before blood is taken from a donor, a complete history is taken to make  sure that person has no history of diseases nor engages in risky social behavior (examples are intravenous drug use or sexual activity with multiple partners). The donor's travel history is screened to minimize risk of transmitting infections, such as malaria. The donated blood is tested for signs of infectious diseases, such as HIV and hepatitis. The blood is then tested to be sure it is compatible with you in order to minimize the chance of a transfusion reaction. If you or a relative donates blood, this is often done in anticipation of surgery and is not appropriate for emergency situations. It takes many days to process the donated blood. RISKS AND COMPLICATIONS Although transfusion therapy is very safe and saves many lives, the main dangers of transfusion include:   Getting an infectious disease.  Developing a transfusion reaction. This is an allergic reaction to something in the blood you were given. Every precaution is taken to prevent this. The decision to have a blood transfusion has been considered carefully by your caregiver before blood is given. Blood is not given unless the benefits outweigh the risks. AFTER THE TRANSFUSION  Right after receiving a blood transfusion, you will usually  feel much better and more energetic. This is especially true if your red blood cells have gotten low (anemic). The transfusion raises the level of the red blood cells which carry oxygen, and this usually causes an energy increase.  The nurse administering the transfusion will monitor you carefully for complications. HOME CARE INSTRUCTIONS  No special instructions are needed after a transfusion. You may find your energy is better. Speak with your caregiver about any limitations on activity for underlying diseases you may have. SEEK MEDICAL CARE IF:   Your condition is not improving after your transfusion.  You develop redness or irritation at the intravenous (IV) site. SEEK IMMEDIATE MEDICAL CARE IF:   Any of the following symptoms occur over the next 12 hours:  Shaking chills.  You have a temperature by mouth above 102 F (38.9 C), not controlled by medicine.  Chest, back, or muscle pain.  People around you feel you are not acting correctly or are confused.  Shortness of breath or difficulty breathing.  Dizziness and fainting.  You get a rash or develop hives.  You have a decrease in urine output.  Your urine turns a dark color or changes to pink, red, or brown. Any of the following symptoms occur over the next 10 days:  You have a temperature by mouth above 102 F (38.9 C), not controlled by medicine.  Shortness of breath.  Weakness after normal activity.  The white part of the eye turns yellow (jaundice).  You have a decrease in the amount of urine or are urinating less often.  Your urine turns a dark color or changes to pink, red, or brown. Document Released: 07/15/2000 Document Revised: 10/10/2011 Document Reviewed: 03/03/2008 [facility] Patient Information 2014 [place], [place].  _______________________________________________________________________

## 2016-02-23 ENCOUNTER — Ambulatory Visit: Payer: Self-pay | Admitting: Orthopedic Surgery

## 2016-02-23 NOTE — H&P (Signed)
Tina [name] DOB: 02-Jul-1931 Married / Language: English / Race: White Female Date of Admission:  02/24/2016 CC:  Left hip pain History of Present Illness The patient is a 82 year old female who comes in today for a preoperative History and Physical. The patient is scheduled for a left total hip arthroplasty (anterior) to be performed by Dr. Gus Rankin. Aluisio, MD at [facility] on 02/24/2016. The patient is a 82 year old female who presented for follow up of their hip. The patient is being followed for their bilateral hip pain. They are >1 year (December 2015) out from s/p IA injection (and approximately 12+ years s/p right THA Dr. Bard Herbert). Symptoms reported include: pain, pain after sitting, pain at night, aching, throbbing and stiffness. The patient feels that they are doing poorly and report their pain level to be moderate and moderate to severe. Current treatment includes: NSAIDs. The following medication has been used for pain control: antiinflammatory medication. Unfortunately, her left hip has gotten progressively worse. It is now hurting at all times. It is limiting what she can and cannot do. She is losing more motion in the hip. It is felt that she would benefit from surgery. They have been treated conservatively in the past for the above stated problem and despite conservative measures, they continue to have progressive pain and severe functional limitations and dysfunction. They have failed non-operative management including home exercise, medications, and injections. It is felt that they would benefit from undergoing total joint replacement. Risks and benefits of the procedure have been discussed with the patient and they elect to proceed with surgery. There are no active contraindications to surgery such as ongoing infection or rapidly progressive neurological disease.  Problem List/Past Medical Fracture of fifth finger, proximal phalanx, left, open (816.11)  Knee Pain  (M25.569)  Contusion of left hip (S70.02XA)  Primary osteoarthritis of left hip (M16.12) [10/31/2001]: Anxiety Disorder  Chronic Cystitis  Depression  Diverticulitis Of Colon  Hypertension  Hypercholesterolemia  Gastroesophageal Reflux Disease  Osteoporosis  Skin Cancer  Vertigo  Shingles  Hemorrhoids  Diverticulosis  Urinary Tract Infection   Allergies PredniSONE *CORTICOSTEROIDS*  Rash. Bextra *ANALGESICS - ANTI-INFLAMMATORY*  CeleBREX *ANALGESICS - ANTI-INFLAMMATORY*  GI Intolerance  Family History Cancer  Brother, Father, Paternal Grandfather, Sister. child father, sister and brother Cerebrovascular Accident  Brother, Father, Paternal Grandmother. Congestive Heart Failure  Brother. brother Diabetes Mellitus  Father, Sister. child father and sister First Degree Relatives  reported Heart Disease  Brother, Father, Paternal Grandmother, Sister. Heart disease in female family member before age 38  Heart disease in female family member before age 80  Hypertension  Brother, Father, Mother, Paternal Grandmother, Sister. Liver Disease, Chronic  child Osteoarthritis  Brother, Mother, Sister. Osteoporosis  Sister. sister Severe allergy  Father. child  Social History Children  1 2 Alcohol use  never consumed alcohol Living situation  live with spouse Current work status  retired Marital status  married Drug/Alcohol Rehab (Currently)  no Never consumed alcohol  06/13/2014: Never consumed alcohol Drug/Alcohol Rehab (Previously)  no No history of drug/alcohol rehab  Not under pain contract  Exercise  Exercises daily Number of flights of stairs before winded  less than 1 Illicit drug use  no Tobacco / smoke exposure  06/13/2014: no no Tobacco use  Former smoker. 06/13/2014: smoke(d) less than 1/2 pack(s) per day former smoker Pain Contract  no Post-Surgical Plans  Home versus SNF depending upon porgress.  Medication History   Alendronate Sodium (70MG   Tablet, Oral) Active. Citalopram Hydrobromide (20MG Tablet, 1 1/2 Oral daily) Active. Atorvastatin Calcium (10MG Tablet, Oral) Active. Zolpidem Tartrate (5MG Tablet, Oral as needed) Active. Vitamin D3 (Oral) Specific strength unknown - Active. Ambien (5MG Tablet, Oral) Active. (PRN) Hydrocodone-Homatropine (5-1.5MG/5ML Syrup, Oral as needed) Active. (for chronic cough) Calcium (Oral) Specific strength unknown - Active. Biotin (10MG Capsule, Oral) Active. PriLOSEC (20MG Capsule DR, Oral) Active.   Past Surgical History Arthroscopy of Shoulder  right Cataract Surgery  bilateral Colectomy  partial Appendectomy  Dilation and Curettage of Uterus  Total Hip Replacement  right   Review of Systems  General Present- Night Sweats. Not Present- Chills, Fatigue, Fever, Memory Loss, Weight Gain and Weight Loss. Skin Not Present- Eczema, Hives, Itching, Lesions and Rash. HEENT Not Present- Dentures, Double Vision, Headache, Hearing Loss, Tinnitus and Visual Loss. Respiratory Present- Cough (chronic cough). Not Present- Allergies, Chronic Cough, Coughing up blood, Shortness of breath at rest and Shortness of breath with exertion. Cardiovascular Not Present- Chest Pain, Difficulty Breathing Lying Down, Murmur, Palpitations, Racing/skipping heartbeats and Swelling. Gastrointestinal Present- Constipation. Not Present- Abdominal Pain, Bloody Stool, Diarrhea, Difficulty Swallowing, Heartburn, Jaundice, Loss of appetitie, Nausea and Vomiting. Female Genitourinary Present- Incontinence and Urinating at Night. Not Present- Blood in Urine, Discharge, Flank Pain, Painful Urination, Urgency, Urinary frequency, Urinary Retention and Weak urinary stream. Musculoskeletal Present- Back Pain, Joint Pain, Morning Stiffness and Muscle Weakness. Not Present- Joint Swelling, Muscle Pain and Spasms. Neurological Not Present- Blackout spells, Difficulty with balance, Dizziness,  Paralysis, Tremor and Weakness. Psychiatric Present- Insomnia.  Vitals  Weight: 157 lb Height: 63in Body Surface Area: 1.74 m Body Mass Index: 27.81 kg/m  Pulse: 64 (Regular)  BP: 132/74 (Sitting, Left Arm, Standard)  Physical Exam General Mental Status -Alert, cooperative and good historian. General Appearance-pleasant, Not in acute distress. Orientation-Oriented X3. Build & Nutrition-Well nourished and Well developed.  Head and Neck Head-normocephalic, atraumatic . Neck Global Assessment - supple, no bruit auscultated on the right, no bruit auscultated on the left.  Eye Vision-Wears corrective lenses(readers). Pupil - Bilateral-Regular and Round. Motion - Bilateral-EOMI.  Chest and Lung Exam Auscultation Breath sounds - clear at anterior chest wall and clear at posterior chest wall. Adventitious sounds - No Adventitious sounds.  Cardiovascular Auscultation Rhythm - Regular rate and rhythm. Heart Sounds - S1 WNL and S2 WNL. Murmurs & Other Heart Sounds - Auscultation of the heart reveals - No Murmurs.  Abdomen Palpation/Percussion Tenderness - Abdomen is non-tender to palpation. Rigidity (guarding) - Abdomen is soft. Auscultation Auscultation of the abdomen reveals - Bowel sounds normal.  Female Genitourinary Note: Not done, not pertinent to present illness   Musculoskeletal Note: On exam, she is alert and oriented, in no apparent distress. Right hip can be flexed to 110, rotated in 30, out 40, abducted 40 without discomfort. Left hip flexion 90. No internal rotation. About 10 degrees of external rotation, 10 to 20 degrees of abduction. She is tender on the lateral aspect of her hip. She has a significantly antalgic gait pattern.  RADIOGRAPHS AP pelvis and lateral left hip showed bone-on-bone arthritis of the left hip, a large subchondral cyst throughout the hip.   Assessment & Plan Primary osteoarthritis of left hip  (M16.12)  Note:Surgical Plans: Left Total Hip Replacement - Anterior Approach  Disposition: Home versus SNF  PCP: Debbie Smothers, FNP - Patient has been seen preoperatively and felt to be stable for surgery.  IV TXA  Anesthesia Issues: None  Signed electronically by Alezandrew L Chasten Blaze, III PA-C  

## 2016-02-24 ENCOUNTER — Inpatient Hospital Stay (HOSPITAL_COMMUNITY): Payer: Medicare Other

## 2016-02-24 ENCOUNTER — Inpatient Hospital Stay (HOSPITAL_COMMUNITY)
Admission: RE | Admit: 2016-02-24 | Discharge: 2016-02-26 | DRG: 470 | Disposition: A | Discharge status: Home Health | Payer: Medicare Other | Source: Ambulatory Visit | Attending: Orthopedic Surgery | Admitting: Orthopedic Surgery

## 2016-02-24 ENCOUNTER — Inpatient Hospital Stay (HOSPITAL_COMMUNITY): Payer: Medicare Other | Admitting: Anesthesiology

## 2016-02-24 ENCOUNTER — Encounter (HOSPITAL_COMMUNITY)
Admission: RE | Disposition: A | Discharge status: Home Health | Payer: Self-pay | Source: Ambulatory Visit | Attending: Orthopedic Surgery

## 2016-02-24 ENCOUNTER — Encounter (HOSPITAL_COMMUNITY): Payer: Self-pay

## 2016-02-24 DIAGNOSIS — Z8673 Personal history of transient ischemic attack (TIA), and cerebral infarction without residual deficits: Secondary | ICD-10-CM

## 2016-02-24 DIAGNOSIS — K219 Gastro-esophageal reflux disease without esophagitis: Secondary | ICD-10-CM | POA: Diagnosis present

## 2016-02-24 DIAGNOSIS — Z79899 Other long term (current) drug therapy: Secondary | ICD-10-CM

## 2016-02-24 DIAGNOSIS — I10 Essential (primary) hypertension: Secondary | ICD-10-CM | POA: Diagnosis present

## 2016-02-24 DIAGNOSIS — M1612 Unilateral primary osteoarthritis, left hip: Principal | ICD-10-CM | POA: Diagnosis present

## 2016-02-24 DIAGNOSIS — Z87891 Personal history of nicotine dependence: Secondary | ICD-10-CM

## 2016-02-24 DIAGNOSIS — M810 Age-related osteoporosis without current pathological fracture: Secondary | ICD-10-CM | POA: Diagnosis present

## 2016-02-24 DIAGNOSIS — E7800 Pure hypercholesterolemia, unspecified: Secondary | ICD-10-CM | POA: Diagnosis present

## 2016-02-24 DIAGNOSIS — F419 Anxiety disorder, unspecified: Secondary | ICD-10-CM | POA: Diagnosis present

## 2016-02-24 DIAGNOSIS — Z888 Allergy status to other drugs, medicaments and biological substances status: Secondary | ICD-10-CM | POA: Diagnosis not present

## 2016-02-24 DIAGNOSIS — M169 Osteoarthritis of hip, unspecified: Secondary | ICD-10-CM | POA: Diagnosis present

## 2016-02-24 DIAGNOSIS — Z85828 Personal history of other malignant neoplasm of skin: Secondary | ICD-10-CM

## 2016-02-24 DIAGNOSIS — F329 Major depressive disorder, single episode, unspecified: Secondary | ICD-10-CM | POA: Diagnosis present

## 2016-02-24 DIAGNOSIS — Z96649 Presence of unspecified artificial hip joint: Secondary | ICD-10-CM

## 2016-02-24 HISTORY — PX: TOTAL HIP ARTHROPLASTY: SHX124

## 2016-02-24 LAB — TYPE AND SCREEN
ABO/RH(D): A POS
ANTIBODY SCREEN: NEGATIVE

## 2016-02-24 SURGERY — ARTHROPLASTY, HIP, TOTAL, ANTERIOR APPROACH
Anesthesia: Spinal | Site: Hip | Laterality: Left

## 2016-02-24 MED ORDER — DIPHENHYDRAMINE HCL 12.5 MG/5ML PO ELIX: 12.5000 mg | ORAL_SOLUTION | ORAL | Status: DC | PRN

## 2016-02-24 MED ORDER — HYDROMORPHONE HCL 1 MG/ML IJ SOLN: 0.2500 mg | INTRAMUSCULAR | Status: DC | PRN

## 2016-02-24 MED ORDER — PROPOFOL 500 MG/50ML IV EMUL
INTRAVENOUS | Status: DC | PRN
  Administered 2016-02-24: 140 mg via INTRAVENOUS
  Administered 2016-02-24: 40 mg via INTRAVENOUS

## 2016-02-24 MED ORDER — ACETAMINOPHEN 500 MG PO TABS
1000.0000 mg | ORAL_TABLET | Freq: Four times a day (QID) | ORAL | Status: AC
  Administered 2016-02-24 – 2016-02-25 (×4): 1000 mg via ORAL
  Filled 2016-02-24 (×4): qty 2

## 2016-02-24 MED ORDER — SUGAMMADEX SODIUM 200 MG/2ML IV SOLN
INTRAVENOUS | Status: AC
  Filled 2016-02-24: qty 2

## 2016-02-24 MED ORDER — ONDANSETRON HCL 4 MG/2ML IJ SOLN: 4.0000 mg | Freq: Four times a day (QID) | INTRAMUSCULAR | Status: DC | PRN

## 2016-02-24 MED ORDER — ONDANSETRON HCL 4 MG PO TABS: 4.0000 mg | ORAL_TABLET | Freq: Four times a day (QID) | ORAL | Status: DC | PRN

## 2016-02-24 MED ORDER — ONDANSETRON HCL 4 MG/2ML IJ SOLN
INTRAMUSCULAR | Status: DC | PRN
  Administered 2016-02-24: 4 mg via INTRAVENOUS

## 2016-02-24 MED ORDER — FENTANYL CITRATE (PF) 100 MCG/2ML IJ SOLN
INTRAMUSCULAR | Status: AC
  Filled 2016-02-24: qty 2

## 2016-02-24 MED ORDER — METHOCARBAMOL 500 MG PO TABS
500.0000 mg | ORAL_TABLET | Freq: Four times a day (QID) | ORAL | Status: DC | PRN
  Administered 2016-02-25 – 2016-02-26 (×3): 500 mg via ORAL
  Filled 2016-02-24 (×3): qty 1

## 2016-02-24 MED ORDER — LACTATED RINGERS IV SOLN
INTRAVENOUS | Status: DC
  Administered 2016-02-24: 1000 mL via INTRAVENOUS
  Administered 2016-02-24: 11:00:00 via INTRAVENOUS

## 2016-02-24 MED ORDER — DEXAMETHASONE SODIUM PHOSPHATE 10 MG/ML IJ SOLN
INTRAMUSCULAR | Status: DC | PRN
  Administered 2016-02-24: 10 mg via INTRAVENOUS

## 2016-02-24 MED ORDER — METOCLOPRAMIDE HCL 5 MG PO TABS: 5.0000 mg | ORAL_TABLET | Freq: Three times a day (TID) | ORAL | Status: DC | PRN

## 2016-02-24 MED ORDER — SODIUM CHLORIDE 0.9 % IV SOLN
INTRAVENOUS | Status: DC
Start: 2016-02-24 — End: 2016-02-26
  Administered 2016-02-24 – 2016-02-25 (×2): via INTRAVENOUS

## 2016-02-24 MED ORDER — PHENOL 1.4 % MT LIQD: 1.0000 | OROMUCOSAL | Status: DC | PRN

## 2016-02-24 MED ORDER — ACETAMINOPHEN 10 MG/ML IV SOLN
INTRAVENOUS | Status: AC
  Filled 2016-02-24: qty 100

## 2016-02-24 MED ORDER — CEFAZOLIN SODIUM-DEXTROSE 2-4 GM/100ML-% IV SOLN
2.0000 g | INTRAVENOUS | Status: AC
  Administered 2016-02-24: 2 g via INTRAVENOUS
  Filled 2016-02-24: qty 100

## 2016-02-24 MED ORDER — DEXAMETHASONE SODIUM PHOSPHATE 10 MG/ML IJ SOLN
10.0000 mg | Freq: Once | INTRAMUSCULAR | Status: DC
Start: 2016-02-24 — End: 2016-02-24

## 2016-02-24 MED ORDER — METHOCARBAMOL 1000 MG/10ML IJ SOLN
500.0000 mg | Freq: Four times a day (QID) | INTRAVENOUS | Status: DC | PRN
  Filled 2016-02-24: qty 5

## 2016-02-24 MED ORDER — BISACODYL 10 MG RE SUPP: 10.0000 mg | Freq: Every day | RECTAL | Status: DC | PRN

## 2016-02-24 MED ORDER — TRANEXAMIC ACID 1000 MG/10ML IV SOLN
1000.0000 mg | INTRAVENOUS | Status: AC
  Administered 2016-02-24: 1000 mg via INTRAVENOUS
  Filled 2016-02-24: qty 10

## 2016-02-24 MED ORDER — PROMETHAZINE HCL 25 MG/ML IJ SOLN: 6.2500 mg | INTRAMUSCULAR | Status: DC | PRN

## 2016-02-24 MED ORDER — BUPIVACAINE HCL (PF) 0.25 % IJ SOLN
INTRAMUSCULAR | Status: DC | PRN
  Administered 2016-02-24: 30 mL

## 2016-02-24 MED ORDER — ACETAMINOPHEN 10 MG/ML IV SOLN
INTRAVENOUS | Status: DC | PRN
  Administered 2016-02-24: 1000 mg via INTRAVENOUS

## 2016-02-24 MED ORDER — POLYETHYLENE GLYCOL 3350 17 G PO PACK: 17.0000 g | PACK | Freq: Every day | ORAL | Status: DC | PRN

## 2016-02-24 MED ORDER — TRANEXAMIC ACID 1000 MG/10ML IV SOLN
1000.0000 mg | Freq: Once | INTRAVENOUS | Status: AC
  Administered 2016-02-24: 1000 mg via INTRAVENOUS
  Filled 2016-02-24: qty 10

## 2016-02-24 MED ORDER — LIDOCAINE HCL (CARDIAC) 20 MG/ML IV SOLN
INTRAVENOUS | Status: DC | PRN
  Administered 2016-02-24: 50 mg via INTRAVENOUS

## 2016-02-24 MED ORDER — ACETAMINOPHEN 650 MG RE SUPP: 650.0000 mg | Freq: Four times a day (QID) | RECTAL | Status: DC | PRN

## 2016-02-24 MED ORDER — EPHEDRINE SULFATE 50 MG/ML IJ SOLN
INTRAMUSCULAR | Status: DC | PRN
  Administered 2016-02-24: 10 mg via INTRAVENOUS
  Administered 2016-02-24: 5 mg via INTRAVENOUS

## 2016-02-24 MED ORDER — ROCURONIUM BROMIDE 100 MG/10ML IV SOLN
INTRAVENOUS | Status: DC | PRN
  Administered 2016-02-24: 50 mg via INTRAVENOUS

## 2016-02-24 MED ORDER — ATORVASTATIN CALCIUM 10 MG PO TABS
10.0000 mg | ORAL_TABLET | Freq: Every day | ORAL | Status: DC
  Administered 2016-02-24 – 2016-02-25 (×2): 10 mg via ORAL
  Filled 2016-02-24 (×2): qty 1

## 2016-02-24 MED ORDER — FLEET ENEMA 7-19 GM/118ML RE ENEM: 1.0000 | ENEMA | Freq: Once | RECTAL | Status: DC | PRN

## 2016-02-24 MED ORDER — FENTANYL CITRATE (PF) 100 MCG/2ML IJ SOLN
INTRAMUSCULAR | Status: DC | PRN
  Administered 2016-02-24 (×3): 100 ug via INTRAVENOUS

## 2016-02-24 MED ORDER — PROPOFOL 10 MG/ML IV BOLUS
INTRAVENOUS | Status: AC
  Filled 2016-02-24: qty 20

## 2016-02-24 MED ORDER — MENTHOL 3 MG MT LOZG: 1.0000 | LOZENGE | OROMUCOSAL | Status: DC | PRN

## 2016-02-24 MED ORDER — OXYCODONE HCL 5 MG PO TABS
5.0000 mg | ORAL_TABLET | ORAL | Status: DC | PRN
  Administered 2016-02-24 – 2016-02-25 (×4): 5 mg via ORAL
  Administered 2016-02-25 (×2): 10 mg via ORAL
  Filled 2016-02-24 (×2): qty 1
  Filled 2016-02-24 (×2): qty 2
  Filled 2016-02-24 (×2): qty 1

## 2016-02-24 MED ORDER — CITALOPRAM HYDROBROMIDE 20 MG PO TABS
20.0000 mg | ORAL_TABLET | Freq: Every day | ORAL | Status: DC
  Administered 2016-02-25 – 2016-02-26 (×2): 20 mg via ORAL
  Filled 2016-02-24 (×2): qty 1

## 2016-02-24 MED ORDER — RIVAROXABAN 10 MG PO TABS
10.0000 mg | ORAL_TABLET | Freq: Every day | ORAL | Status: DC
  Administered 2016-02-25 – 2016-02-26 (×2): 10 mg via ORAL
  Filled 2016-02-24 (×2): qty 1

## 2016-02-24 MED ORDER — SUGAMMADEX SODIUM 200 MG/2ML IV SOLN
INTRAVENOUS | Status: DC | PRN
  Administered 2016-02-24: 150 mg via INTRAVENOUS

## 2016-02-24 MED ORDER — MORPHINE SULFATE (PF) 2 MG/ML IV SOLN
1.0000 mg | INTRAVENOUS | Status: DC | PRN
  Filled 2016-02-24: qty 1

## 2016-02-24 MED ORDER — CEFAZOLIN SODIUM-DEXTROSE 2-4 GM/100ML-% IV SOLN
2.0000 g | Freq: Four times a day (QID) | INTRAVENOUS | Status: AC
  Administered 2016-02-24 (×2): 2 g via INTRAVENOUS
  Filled 2016-02-24 (×2): qty 100

## 2016-02-24 MED ORDER — MEPERIDINE HCL 50 MG/ML IJ SOLN: 6.2500 mg | INTRAMUSCULAR | Status: DC | PRN

## 2016-02-24 MED ORDER — 0.9 % SODIUM CHLORIDE (POUR BTL) OPTIME
TOPICAL | Status: DC | PRN
  Administered 2016-02-24: 1000 mL

## 2016-02-24 MED ORDER — ACETAMINOPHEN 325 MG PO TABS
650.0000 mg | ORAL_TABLET | Freq: Four times a day (QID) | ORAL | Status: DC | PRN
  Filled 2016-02-24: qty 2

## 2016-02-24 MED ORDER — METOCLOPRAMIDE HCL 5 MG/ML IJ SOLN: 10.0000 mg | Freq: Once | INTRAMUSCULAR | Status: DC | PRN

## 2016-02-24 MED ORDER — PANTOPRAZOLE SODIUM 40 MG PO TBEC: 40.0000 mg | DELAYED_RELEASE_TABLET | Freq: Every day | ORAL | Status: DC | PRN

## 2016-02-24 MED ORDER — FENTANYL CITRATE (PF) 100 MCG/2ML IJ SOLN: 25.0000 ug | INTRAMUSCULAR | Status: DC | PRN

## 2016-02-24 MED ORDER — DOCUSATE SODIUM 100 MG PO CAPS
100.0000 mg | ORAL_CAPSULE | Freq: Two times a day (BID) | ORAL | Status: DC
  Administered 2016-02-24 – 2016-02-26 (×4): 100 mg via ORAL
  Filled 2016-02-24 (×4): qty 1

## 2016-02-24 MED ORDER — ZOLPIDEM TARTRATE 5 MG PO TABS
2.5000 mg | ORAL_TABLET | Freq: Every evening | ORAL | Status: DC | PRN
  Administered 2016-02-25: 2.5 mg via ORAL
  Filled 2016-02-24: qty 1

## 2016-02-24 MED ORDER — SODIUM CHLORIDE 0.9 % IJ SOLN
INTRAMUSCULAR | Status: AC
  Filled 2016-02-24: qty 10

## 2016-02-24 MED ORDER — ONDANSETRON HCL 4 MG/2ML IJ SOLN
INTRAMUSCULAR | Status: AC
  Filled 2016-02-24: qty 2

## 2016-02-24 MED ORDER — POLYVINYL ALCOHOL 1.4 % OP SOLN
1.0000 [drp] | OPHTHALMIC | Status: DC | PRN
  Administered 2016-02-24 (×2): 1 [drp] via OPHTHALMIC
  Filled 2016-02-24: qty 15

## 2016-02-24 MED ORDER — CEFAZOLIN SODIUM-DEXTROSE 2-4 GM/100ML-% IV SOLN
INTRAVENOUS | Status: AC
  Filled 2016-02-24: qty 100

## 2016-02-24 MED ORDER — CHLORHEXIDINE GLUCONATE 4 % EX LIQD
60.0000 mL | Freq: Once | CUTANEOUS | Status: DC
Start: 2016-02-24 — End: 2016-02-24

## 2016-02-24 MED ORDER — PROPOFOL 10 MG/ML IV BOLUS
INTRAVENOUS | Status: AC
  Filled 2016-02-24: qty 40

## 2016-02-24 MED ORDER — LABETALOL HCL 5 MG/ML IV SOLN
INTRAVENOUS | Status: AC
  Filled 2016-02-24: qty 4

## 2016-02-24 MED ORDER — METOCLOPRAMIDE HCL 5 MG/ML IJ SOLN: 5.0000 mg | Freq: Three times a day (TID) | INTRAMUSCULAR | Status: DC | PRN

## 2016-02-24 MED ORDER — LABETALOL HCL 5 MG/ML IV SOLN
INTRAVENOUS | Status: DC | PRN
  Administered 2016-02-24: 10 mg via INTRAVENOUS

## 2016-02-24 MED ORDER — TRANEXAMIC ACID 1000 MG/10ML IV SOLN: 1000.0000 mg | INTRAVENOUS | Status: DC

## 2016-02-24 MED ORDER — BUPIVACAINE HCL (PF) 0.25 % IJ SOLN
INTRAMUSCULAR | Status: AC
  Filled 2016-02-24: qty 30

## 2016-02-24 SURGICAL SUPPLY — 34 items
BAG DECANTER FOR FLEXI CONT (MISCELLANEOUS) ×3 IMPLANT
BAG ZIPLOCK 12X15 (MISCELLANEOUS) IMPLANT
BLADE SAG 18X100X1.27 (BLADE) ×3 IMPLANT
CAPT HIP TOTAL 2 ×3 IMPLANT
CLOSURE WOUND 1/2 X4 (GAUZE/BANDAGES/DRESSINGS) ×1
CLOTH BEACON ORANGE TIMEOUT ST (SAFETY) ×3 IMPLANT
COVER PERINEAL POST (MISCELLANEOUS) ×3 IMPLANT
DECANTER SPIKE VIAL GLASS SM (MISCELLANEOUS) ×3 IMPLANT
DRAPE STERI IOBAN 125X83 (DRAPES) ×3 IMPLANT
DRAPE U-SHAPE 47X51 STRL (DRAPES) ×6 IMPLANT
DRSG ADAPTIC 3X8 NADH LF (GAUZE/BANDAGES/DRESSINGS) ×3 IMPLANT
DRSG MEPILEX BORDER 4X4 (GAUZE/BANDAGES/DRESSINGS) ×3 IMPLANT
DRSG MEPILEX BORDER 4X8 (GAUZE/BANDAGES/DRESSINGS) ×3 IMPLANT
DURAPREP 26ML APPLICATOR (WOUND CARE) ×3 IMPLANT
ELECT REM PT RETURN 9FT ADLT (ELECTROSURGICAL) ×3
ELECTRODE REM PT RTRN 9FT ADLT (ELECTROSURGICAL) ×1 IMPLANT
EVACUATOR 1/8 PVC DRAIN (DRAIN) ×3 IMPLANT
GLOVE BIO SURGEON STRL SZ7.5 (GLOVE) ×3 IMPLANT
GLOVE BIO SURGEON STRL SZ8 (GLOVE) ×6 IMPLANT
GLOVE BIOGEL PI IND STRL 8 (GLOVE) ×2 IMPLANT
GLOVE BIOGEL PI INDICATOR 8 (GLOVE) ×4
GOWN STRL REUS W/TWL LRG LVL3 (GOWN DISPOSABLE) ×3 IMPLANT
GOWN STRL REUS W/TWL XL LVL3 (GOWN DISPOSABLE) ×3 IMPLANT
PACK ANTERIOR HIP CUSTOM (KITS) ×3 IMPLANT
STRIP CLOSURE SKIN 1/2X4 (GAUZE/BANDAGES/DRESSINGS) ×2 IMPLANT
SUT ETHIBOND NAB CT1 #1 30IN (SUTURE) ×3 IMPLANT
SUT MNCRL AB 4-0 PS2 18 (SUTURE) ×3 IMPLANT
SUT VIC AB 2-0 CT1 27 (SUTURE) ×4
SUT VIC AB 2-0 CT1 TAPERPNT 27 (SUTURE) ×2 IMPLANT
SUT VLOC 180 0 24IN GS25 (SUTURE) ×3 IMPLANT
SYR 50ML LL SCALE MARK (SYRINGE) IMPLANT
TRAY FOLEY W/METER SILVER 14FR (SET/KITS/TRAYS/PACK) ×3 IMPLANT
TRAY FOLEY W/METER SILVER 16FR (SET/KITS/TRAYS/PACK) IMPLANT
YANKAUER SUCT BULB TIP 10FT TU (MISCELLANEOUS) ×3 IMPLANT

## 2016-02-24 NOTE — Interval H&P Note (Signed)
History and Physical Interval Note:  02/24/2016 8:58 AM  Tina [name]  has presented today for surgery, with the diagnosis of LEFT HIP OA  The various methods of treatment have been discussed with the patient and family. After consideration of risks, benefits and other options for treatment, the patient has consented to  Procedure(s): LEFT TOTAL HIP ARTHROPLASTY ANTERIOR APPROACH (Left) as a surgical intervention .  The patient's history has been reviewed, patient examined, no change in status, stable for surgery.  I have reviewed the patient's chart and labs.  Questions were answered to the patient's satisfaction.     Loanne Drilling

## 2016-02-24 NOTE — H&P (View-Only) (Signed)
Tina [name] DOB: 02-Jul-1931 Married / Language: English / Race: White Female Date of Admission:  02/24/2016 CC:  Left hip pain History of Present Illness The patient is a 82 year old female who comes in today for a preoperative History and Physical. The patient is scheduled for a left total hip arthroplasty (anterior) to be performed by Dr. Gus Rankin. Aluisio, MD at [facility] on 02/24/2016. The patient is a 82 year old female who presented for follow up of their hip. The patient is being followed for their bilateral hip pain. They are >1 year (December 2015) out from s/p IA injection (and approximately 12+ years s/p right THA Dr. Bard Herbert). Symptoms reported include: pain, pain after sitting, pain at night, aching, throbbing and stiffness. The patient feels that they are doing poorly and report their pain level to be moderate and moderate to severe. Current treatment includes: NSAIDs. The following medication has been used for pain control: antiinflammatory medication. Unfortunately, her left hip has gotten progressively worse. It is now hurting at all times. It is limiting what she can and cannot do. She is losing more motion in the hip. It is felt that she would benefit from surgery. They have been treated conservatively in the past for the above stated problem and despite conservative measures, they continue to have progressive pain and severe functional limitations and dysfunction. They have failed non-operative management including home exercise, medications, and injections. It is felt that they would benefit from undergoing total joint replacement. Risks and benefits of the procedure have been discussed with the patient and they elect to proceed with surgery. There are no active contraindications to surgery such as ongoing infection or rapidly progressive neurological disease.  Problem List/Past Medical Fracture of fifth finger, proximal phalanx, left, open (816.11)  Knee Pain  (M25.569)  Contusion of left hip (S70.02XA)  Primary osteoarthritis of left hip (M16.12) [10/31/2001]: Anxiety Disorder  Chronic Cystitis  Depression  Diverticulitis Of Colon  Hypertension  Hypercholesterolemia  Gastroesophageal Reflux Disease  Osteoporosis  Skin Cancer  Vertigo  Shingles  Hemorrhoids  Diverticulosis  Urinary Tract Infection   Allergies PredniSONE *CORTICOSTEROIDS*  Rash. Bextra *ANALGESICS - ANTI-INFLAMMATORY*  CeleBREX *ANALGESICS - ANTI-INFLAMMATORY*  GI Intolerance  Family History Cancer  Brother, Father, Paternal Grandfather, Sister. child father, sister and brother Cerebrovascular Accident  Brother, Father, Paternal Grandmother. Congestive Heart Failure  Brother. brother Diabetes Mellitus  Father, Sister. child father and sister First Degree Relatives  reported Heart Disease  Brother, Father, Paternal Grandmother, Sister. Heart disease in female family member before age 38  Heart disease in female family member before age 80  Hypertension  Brother, Father, Mother, Paternal Grandmother, Sister. Liver Disease, Chronic  child Osteoarthritis  Brother, Mother, Sister. Osteoporosis  Sister. sister Severe allergy  Father. child  Social History Children  1 2 Alcohol use  never consumed alcohol Living situation  live with spouse Current work status  retired Marital status  married Drug/Alcohol Rehab (Currently)  no Never consumed alcohol  06/13/2014: Never consumed alcohol Drug/Alcohol Rehab (Previously)  no No history of drug/alcohol rehab  Not under pain contract  Exercise  Exercises daily Number of flights of stairs before winded  less than 1 Illicit drug use  no Tobacco / smoke exposure  06/13/2014: no no Tobacco use  Former smoker. 06/13/2014: smoke(d) less than 1/2 pack(s) per day former smoker Pain Contract  no Post-Surgical Plans  Home versus SNF depending upon porgress.  Medication History   Alendronate Sodium (70MG   Tablet, Oral) Active. Citalopram Hydrobromide (  Tablet, 1 1/2 Oral daily) Active. Atorvastatin Calcium (  Tablet, Oral) Active. Zolpidem Tartrate (  Tablet, Oral as needed) Active. Vitamin D3 (Oral) Specific strength unknown - Active. Ambien (  Tablet, Oral) Active. (PRN) Hydrocodone-Homatropine (5-1.5MG /5ML Syrup, Oral as needed) Active. (for chronic cough) Calcium (Oral) Specific strength unknown - Active. Biotin (  Capsule, Oral) Active. PriLOSEC (  Capsule DR, Oral) Active.   Past Surgical History Arthroscopy of Shoulder  right Cataract Surgery  bilateral Colectomy  partial Appendectomy  Dilation and Curettage of Uterus  Total Hip Replacement  right   Review of Systems  General Present- Night Sweats. Not Present- Chills, Fatigue, Fever, Memory Loss, Weight Gain and Weight Loss. Skin Not Present- Eczema, Hives, Itching, Lesions and Rash. HEENT Not Present- Dentures, Double Vision, Headache, Hearing Loss, Tinnitus and Visual Loss. Respiratory Present- Cough (chronic cough). Not Present- Allergies, Chronic Cough, Coughing up blood, Shortness of breath at rest and Shortness of breath with exertion. Cardiovascular Not Present- Chest Pain, Difficulty Breathing Lying Down, Murmur, Palpitations, Racing/skipping heartbeats and Swelling. Gastrointestinal Present- Constipation. Not Present- Abdominal Pain, Bloody Stool, Diarrhea, Difficulty Swallowing, Heartburn, Jaundice, Loss of appetitie, Nausea and Vomiting. Female Genitourinary Present- Incontinence and Urinating at Night. Not Present- Blood in Urine, Discharge, Flank Pain, Painful Urination, Urgency, Urinary frequency, Urinary Retention and Weak urinary stream. Musculoskeletal Present- Back Pain, Joint Pain, Morning Stiffness and Muscle Weakness. Not Present- Joint Swelling, Muscle Pain and Spasms. Neurological Not Present- Blackout spells, Difficulty with balance, Dizziness,  Paralysis, Tremor and Weakness. Psychiatric Present- Insomnia.  Vitals  Weight: 157 lb Height: 63in Body Surface Area: 1.74 m Body Mass Index: 27.81 kg/m  Pulse: 64 (Regular)  BP: 132/74 (Sitting, Left Arm, Standard)  Physical Exam General Mental Status -Alert, cooperative and good historian. General Appearance-pleasant, Not in acute distress. Orientation-Oriented X3. Build & Nutrition-Well nourished and Well developed.  Head and Neck Head-normocephalic, atraumatic . Neck Global Assessment - supple, no bruit auscultated on the right, no bruit auscultated on the left.  Eye Vision-Wears corrective lenses(readers). Pupil - Bilateral-Regular and Round. Motion - Bilateral-EOMI.  Chest and Lung Exam Auscultation Breath sounds - clear at anterior chest wall and clear at posterior chest wall. Adventitious sounds - No Adventitious sounds.  Cardiovascular Auscultation Rhythm - Regular rate and rhythm. Heart Sounds - S1 WNL and S2 WNL. Murmurs & Other Heart Sounds - Auscultation of the heart reveals - No Murmurs.  Abdomen Palpation/Percussion Tenderness - Abdomen is non-tender to palpation. Rigidity (guarding) - Abdomen is soft. Auscultation Auscultation of the abdomen reveals - Bowel sounds normal.  Female Genitourinary Note: Not done, not pertinent to present illness   Musculoskeletal Note: On exam, she is alert and oriented, in no apparent distress. Right hip can be flexed to 110, rotated in 30, out 40, abducted 40 without discomfort. Left hip flexion 90. No internal rotation. About 10 degrees of external rotation, 10 to 20 degrees of abduction. She is tender on the lateral aspect of her hip. She has a significantly antalgic gait pattern.  RADIOGRAPHS AP pelvis and lateral left hip showed bone-on-bone arthritis of the left hip, a large subchondral cyst throughout the hip.   Assessment & Plan Primary osteoarthritis of left hip  (M16.12)  Note:Surgical Plans: Left Total Hip Replacement - Anterior Approach  Disposition: Home versus SNF  PCP: Mariann Laster, FNP - Patient has been seen preoperatively and felt to be stable for surgery.  IV TXA  Anesthesia Issues: None  Signed electronically by Beckey Rutter, III PA-C

## 2016-02-24 NOTE — Progress Notes (Signed)
Patient c/o of left eye hurting and feeling scratched. D perkins PA notified and orders received.  Sharrell Ku RN

## 2016-02-24 NOTE — Transfer of Care (Signed)
Immediate Anesthesia Transfer of Care Note  Patient: LE INGS  Procedure(s) Performed: Procedure(s): LEFT TOTAL HIP ARTHROPLASTY ANTERIOR APPROACH (Left)  Patient Location: PACU  Anesthesia Type:General  Level of Consciousness: sedated, patient cooperative and responds to stimulation  Airway & Oxygen Therapy: Patient Spontanous Breathing and Patient connected to face mask oxygen  Post-op Assessment: Report given to RN and Post -op Vital signs reviewed and stable  Post vital signs: Reviewed and stable  Last Vitals:  Vitals:   02/24/16 0659  BP: 123/87  Pulse: 80  Resp: 18  Temp: 36.7 C    Last Pain:  Vitals:   02/24/16 0659  TempSrc: Oral      Patients Stated Pain Goal: 4 (02/24/16 0922)  Complications: No apparent anesthesia complications

## 2016-02-24 NOTE — Anesthesia Procedure Notes (Signed)
Procedure Name: Intubation Performed by: Tina [name] Pre-anesthesia Checklist: Patient identified, Emergency Drugs available, Suction available, Patient being monitored and Timeout performed Patient Re-evaluated:Patient Re-evaluated prior to inductionOxygen Delivery Method: Circle system utilized Preoxygenation: Pre-oxygenation with 100% oxygen Intubation Type: IV induction Ventilation: Mask ventilation without difficulty Laryngoscope Size: Mac and 3 Grade View: Grade II Tube type: Oral Tube size: 7.0 mm Number of attempts: 2 Airway Equipment and Method: Stylet Placement Confirmation: ETT inserted through vocal cords under direct vision,  positive ETCO2,  CO2 detector and breath sounds checked- equal and bilateral Secured at: 21 cm Tube secured with: Tape Dental Injury: Teeth and Oropharynx as per pre-operative assessment        

## 2016-02-24 NOTE — Progress Notes (Signed)
On arrival to PACU, resps shallow; resp effort minimal; unresponsive, blood pressure elevated and heart rate 120's with bigeminy PVC's; oral suction with yankauer, pt stimulated, med for blood pressure given by CRNA per order Dr. Krista Blue, anesthesiologist

## 2016-02-24 NOTE — Anesthesia Postprocedure Evaluation (Signed)
Anesthesia Post Note  Patient: Tina [name]  Procedure(s) Performed: Procedure(s) (LRB): LEFT TOTAL HIP ARTHROPLASTY ANTERIOR APPROACH (Left)  Patient location during evaluation: PACU Anesthesia Type: MAC Level of consciousness: sedated Pain management: pain level controlled Vital Signs Assessment: post-procedure vital signs reviewed and stable Respiratory status: spontaneous breathing and respiratory function stable Cardiovascular status: stable Postop Assessment: spinal receding Anesthetic complications: no    Last Vitals:  Vitals:   02/24/16 1231 02/24/16 1236  BP: (!) 176/81 (!) 104/50  Pulse: (!) 118 82  Resp: (!) 31 15  Temp:      Last Pain:  Vitals:   02/24/16 1315  TempSrc:   PainSc: Asleep                 Zalea Pete DANIEL

## 2016-02-24 NOTE — Op Note (Signed)
OPERATIVE REPORT  PREOPERATIVE DIAGNOSIS: Osteoarthritis of the Left hip.   POSTOPERATIVE DIAGNOSIS: Osteoarthritis of the Left  hip.   PROCEDURE: Left total hip arthroplasty, anterior approach.   SURGEON: [name], MD   ASSISTANT: Avel Peace, PA-C  ANESTHESIA:  General  ESTIMATED BLOOD LOSS:-500 ml    DRAINS: Hemovac x1.   COMPLICATIONS: None   CONDITION: PACU - hemodynamically stable.   BRIEF CLINICAL NOTE: Tina [name] is a 82 y.o. female who has advanced end-  stage arthritis of their Left  hip with progressively worsening pain and  dysfunction.The patient has failed nonoperative management and presents for  total hip arthroplasty.   PROCEDURE IN DETAIL: After successful administration of spinal  anesthetic, the traction boots for the [facility] bed were placed on both  feet and the patient was placed onto the [facility] bed, boots placed into the leg  holders. The Left hip was then isolated from the perineum with plastic  drapes and prepped and draped in the usual sterile fashion. ASIS and  greater trochanter were marked and a oblique incision was made, starting  at about 1 cm lateral and 2 cm distal to the ASIS and coursing towards  the anterior cortex of the femur. The skin was cut with a 10 blade  through subcutaneous tissue to the level of the fascia overlying the  tensor fascia lata muscle. The fascia was then incised in line with the  incision at the junction of the anterior third and posterior 2/3rd. The  muscle was teased off the fascia and then the interval between the TFL  and the rectus was developed. The Hohmann retractor was then placed at  the top of the femoral neck over the capsule. The vessels overlying the  capsule were cauterized and the fat on top of the capsule was removed.  A Hohmann retractor was then placed anterior underneath the rectus  femoris to give exposure to the entire anterior capsule. A T-shaped  capsulotomy was performed.  The edges were tagged and the femoral head  was identified.       Osteophytes are removed off the superior acetabulum.  The femoral neck was then cut in situ with an oscillating saw. Traction  was then applied to the left lower extremity utilizing the [facility]  traction. The femoral head was then removed. Retractors were placed  around the acetabulum and then circumferential removal of the labrum was  performed. Osteophytes were also removed. Reaming starts at 45 mm to  medialize and  Increased in 2 mm increments to 49 mm. We reamed in  approximately 40 degrees of abduction, 20 degrees anteversion. A 50 mm  pinnacle acetabular shell was then impacted in anatomic position under  fluoroscopic guidance with excellent purchase. We did not need to place  any additional dome screws. A 32 mm neutral + 4 marathon liner was then  placed into the acetabular shell.       The femoral lift was then placed along the lateral aspect of the femur  just distal to the vastus ridge. The leg was  externally rotated and capsule  was stripped off the inferior aspect of the femoral neck down to the  level of the lesser trochanter, this was done with electrocautery. The femur was lifted after this was performed. The  leg was then placed in an extended and adducted position essentially delivering the femur. We also removed the capsule superiorly and the piriformis from the piriformis  fossa to gain excellent exposure of the  proximal femur. Rongeur was used to remove some cancellous bone to get  into the lateral portion of the proximal femur for placement of the  initial starter reamer. The starter broaches was placed  the starter broach  and was shown to go down the center of the canal. Broaching  with the  Corail system was then performed starting at size 8, coursing  Up to size 13. A size 13 had excellent torsional and rotational  and axial stability. The trial standard offset neck was then placed  with a 32 + 1 trial  head. The hip was then reduced. We confirmed that  the stem was in the canal both on AP and lateral x-rays. It also has excellent sizing. The hip was reduced with outstanding stability through full extension and full external rotation.. AP pelvis was taken and the leg lengths were measured and found to be equal. Hip was then dislocated again and the femoral head and neck removed. The  femoral broach was removed. Size 13 Corail stem with a standard offset  neck was then impacted into the femur following native anteversion. Has  excellent purchase in the canal. Excellent torsional and rotational and  axial stability. It is confirmed to be in the canal on AP and lateral  fluoroscopic views. The 32 + 1 ceramic head was placed and the hip  reduced with outstanding stability. Again AP pelvis was taken and it  confirmed that the leg lengths were equal. The wound was then copiously  irrigated with saline solution and the capsule reattached and repaired  with Ethibond suture. 30 ml of .25% Bupivicaine was  injected into the capsule and into the edge of the tensor fascia lata as well as subcutaneous tissue. The fascia overlying the tensor fascia lata was then closed with a running #1 V-Loc. Subcu was closed with interrupted 2-0 Vicryl and subcuticular running 4-0 Monocryl. Incision was cleaned  and dried. Steri-Strips and a bulky sterile dressing applied. Hemovac  drain was hooked to suction and then the patient was awakened and transported to  recovery in stable condition.        Please note that a surgical assistant was a medical necessity for this procedure to perform it in a safe and expeditious manner. Assistant was necessary to provide appropriate retraction of vital neurovascular structures and to prevent femoral fracture and allow for anatomic placement of the prosthesis.  [name], M.D.

## 2016-02-24 NOTE — Anesthesia Preprocedure Evaluation (Addendum)
Anesthesia Evaluation  Patient identified by MRN, date of birth, ID band Patient awake    Reviewed: Allergy & Precautions, NPO status , Patient's Chart, lab work & pertinent test results  History of Anesthesia Complications Negative for: history of anesthetic complications  Airway Mallampati: III  TM Distance: >3 FB Neck ROM: Full    Dental no notable dental hx. (+) Teeth Intact   Pulmonary former smoker,  Chronic cough   Pulmonary exam normal breath sounds clear to auscultation       Cardiovascular hypertension, Normal cardiovascular exam Rhythm:Regular Rate:Normal     Neuro/Psych PSYCHIATRIC DISORDERS Depression TIA Neuromuscular disease    GI/Hepatic Neg liver ROS, GERD  Medicated and Controlled,Esophageal stricture   Endo/Other  Hyperlipidemia Osteoporosis  Renal/GU negative Renal ROS Bladder dysfunction  Cystocele Incontinence    Musculoskeletal  (+) Arthritis , Osteoarthritis,  OA left hip   Abdominal   Peds  Hematology  (+) anemia ,   Anesthesia Other Findings   Reproductive/Obstetrics                            Anesthesia Physical Anesthesia Plan  ASA: III  Anesthesia Plan: Spinal   Post-op Pain Management:    Induction: Intravenous  Airway Management Planned: Simple Face Mask and Natural Airway  Additional Equipment:   Intra-op Plan:   Post-operative Plan:   Informed Consent: I have reviewed the patients History and Physical, chart, labs and discussed the procedure including the risks, benefits and alternatives for the proposed anesthesia with the patient or authorized representative who has indicated his/her understanding and acceptance.   Dental advisory given  Plan Discussed with: CRNA, Anesthesiologist and Surgeon  Anesthesia Plan Comments:        Anesthesia Quick Evaluation

## 2016-02-25 LAB — CBC
HCT: 24.4 % — ABNORMAL LOW (ref 36.0–46.0)
Hemoglobin: 7.8 g/dL — ABNORMAL LOW (ref 12.0–15.0)
MCH: 29 pg (ref 26.0–34.0)
MCHC: 32 g/dL (ref 30.0–36.0)
MCV: 90.7 fL (ref 78.0–100.0)
Platelets: 211 10*3/uL (ref 150–400)
RBC: 2.69 MIL/uL — ABNORMAL LOW (ref 3.87–5.11)
RDW: 13.5 % (ref 11.5–15.5)
WBC: 11.2 10*3/uL — ABNORMAL HIGH (ref 4.0–10.5)

## 2016-02-25 LAB — BASIC METABOLIC PANEL
Anion gap: 5 (ref 5–15)
BUN: 17 mg/dL (ref 6–20)
CO2: 26 mmol/L (ref 22–32)
Calcium: 7.8 mg/dL — ABNORMAL LOW (ref 8.9–10.3)
Chloride: 108 mmol/L (ref 101–111)
Creatinine, Ser: 0.82 mg/dL (ref 0.44–1.00)
GFR calc Af Amer: >60 mL/min (ref >60)
GFR calc non Af Amer: >60 mL/min (ref >60)
Glucose, Bld: 145 mg/dL — ABNORMAL HIGH (ref 65–99)
Potassium: 4.5 mmol/L (ref 3.5–5.1)
Sodium: 139 mmol/L (ref 135–145)

## 2016-02-25 MED ORDER — NON FORMULARY: 20.0000 mg | Freq: Every day | Status: DC | PRN

## 2016-02-25 MED ORDER — OMEPRAZOLE 20 MG PO CPDR: 20.0000 mg | DELAYED_RELEASE_CAPSULE | Freq: Every day | ORAL | Status: DC | PRN

## 2016-02-25 NOTE — Progress Notes (Signed)
Physical Therapy Treatment Patient Details Name: Tina [name] MRN: [id number] DOB: [birth year] Today's Date: March 16, 2016    History of Present Illness Pt s/p L THR with hx of R THR (posterior) and TIA    PT Comments    Pt progressing well with mobility and hopeful for dc home tomorrow.  Follow Up Recommendations  Home health PT     Equipment Recommendations  Rolling walker with 5" wheels    Recommendations for Other Services OT consult     Precautions / Restrictions Precautions Precautions: Fall Restrictions Weight Bearing Restrictions: No Other Position/Activity Restrictions: WBAT    Mobility  Bed Mobility Overal bed mobility: Needs Assistance Bed Mobility: Sit to Supine       Sit to supine: Min assist   General bed mobility comments: oob  Transfers Overall transfer level: Needs assistance Equipment used: Rolling walker (2 wheeled) Transfers: Sit to/from Stand Sit to Stand: Min assist         General transfer comment: cues for UE/LE placement  Ambulation/Gait Ambulation/Gait assistance: Min assist;Min guard Ambulation Distance (Feet): 145 Feet Assistive device: Rolling walker (2 wheeled) Gait Pattern/deviations: Step-to pattern;Step-through pattern;Shuffle;Trunk flexed Gait velocity: decr Gait velocity interpretation: Below normal speed for age/gender General Gait Details: cues for sequence, posture and position from [employer]            Wheelchair Mobility    Modified Rankin (Stroke Patients Only)       Balance                                    Cognition Arousal/Alertness: Awake/alert Behavior During Therapy: WFL for tasks assessed/performed Overall Cognitive Status: Within Functional Limits for tasks assessed                      Exercises      General Comments        Pertinent Vitals/Pain Pain Assessment: 0-10 Pain Score: 5  Pain Location: L hip Pain Descriptors / Indicators: Aching Pain  Intervention(s): Limited activity within patient's tolerance;Monitored during session;Premedicated before session;Repositioned (handed off to PT)    Home Living Family/patient expects to be discharged to:: Private residence Living Arrangements: Spouse/significant other Available Help at Discharge: Family         Home Equipment: Bedside commode;Tub bench      Prior Function Level of Independence: Independent;Independent with assistive device(s)          PT Goals (current goals can now be found in the care plan section) Acute Rehab PT Goals Patient Stated Goal: Walk without pain PT Goal Formulation: With patient Time For Goal Achievement: 02/29/16 Potential to Achieve Goals: Good Progress towards PT goals: Progressing toward goals    Frequency  7X/week    PT Plan Current plan remains appropriate    Co-evaluation             End of Session Equipment Utilized During Treatment: Gait belt Activity Tolerance: Patient tolerated treatment well Patient left: in bed;with call bell/phone within reach;with family/visitor present     Time: 5621-3086 PT Time Calculation (min) (ACUTE ONLY): 24 min  Charges:  $Gait Training: 23-37 mins                    G Codes:      Kamyra Schroeck 2016-03-16, 4:17 PM

## 2016-02-25 NOTE — Progress Notes (Signed)
   Subjective: 1 Day Post-Op Procedure(s) (LRB): LEFT TOTAL HIP ARTHROPLASTY ANTERIOR APPROACH (Left) Patient reports pain as mild.   Patient seen in rounds with Dr. Lequita Halt.  Pain is better today. Her eye is better this morning. Patient is well, but has had some minor complaints of pain in the hip, requiring pain medications We will start therapy today.  Plan is to go Home after hospital stay.  Objective: Vital signs in last 24 hours: Temp:  [97.1 F (36.2 C)-98.7 F (37.1 C)] 98.2 F (36.8 C) (07/27 0553) Pulse Rate:  [53-124] 71 (07/27 0553) Resp:  [11-31] 12 (07/27 0553) BP: (102-204)/(41-129) 102/41 (07/27 0553) SpO2:  [99 %-100 %] 100 % (07/27 0553) Weight:  [69.9 kg (154 lb)] 69.9 kg (154 lb) (07/26 1430)  Intake/Output from previous day:  Intake/Output Summary (Last 24 hours) at 02/25/16 0805 Last data filed at 02/25/16 0645  Gross per 24 hour  Intake          3908.75 ml  Output             2040 ml  Net          1868.75 ml    Intake/Output this shift: No intake/output data recorded.  Labs:  Recent Labs  02/25/16 0426  HGB 7.8*    Recent Labs  02/25/16 0426  WBC 11.2*  RBC 2.69*  HCT 24.4*  PLT 211    Recent Labs  02/25/16 0426  NA 139  K 4.5  CL 108  CO2 26  BUN 17  CREATININE 0.82  GLUCOSE 145*  CALCIUM 7.8*   No results for input(s): LABPT, INR in the last 72 hours.  EXAM General - Patient is Alert, Appropriate and Oriented Extremity - Neurovascular intact Sensation intact distally Dorsiflexion/Plantar flexion intact Dressing - dressing C/D/I Motor Function - intact, moving foot and toes well on exam.  Hemovac pulled without difficulty.  Past Medical History:  Diagnosis Date  . Canker sore   . Cystocele   . Depression   . Diverticulosis   . Esophageal dysmotility   . Esophageal stricture   . GERD (gastroesophageal reflux disease)   . Heartburn   . Hypertension   . TIA (transient ischemic attack)     Assessment/Plan: 1  Day Post-Op Procedure(s) (LRB): LEFT TOTAL HIP ARTHROPLASTY ANTERIOR APPROACH (Left) Principal Problem:   OA (osteoarthritis) of hip  Estimated body mass index is 28.17 kg/m as calculated from the following:   Height as of this encounter: 5\' 2"  (1.575 m).   Weight as of this encounter: 69.9 kg (154 lb). Advance diet Up with therapy Plan for discharge tomorrow Discharge home with home health  DVT Prophylaxis - Xarelto Weight Bearing As Tolerated left Leg Hemovac Pulled Begin Therapy  Avel Peace, PA-C Orthopaedic Surgery 02/25/2016, 8:05 AM

## 2016-02-25 NOTE — Evaluation (Signed)
Occupational Therapy Evaluation Patient Details Name: Tina [name] MRN: [id number] DOB: [birth year] Today's Date: 02/25/2016    History of Present Illness Pt s/p L THR with hx of R THR (posterior) and TIA   Clinical Impression   This 82 year old female was admitted for the above sx. All education was completed. Pt does not need any further OT at this time   Follow Up Recommendations  Supervision/Assistance - 24 hour    Equipment Recommendations  None recommended by OT    Recommendations for Other Services       Precautions / Restrictions Precautions Precautions: Fall Restrictions Other Position/Activity Restrictions: WBAT      Mobility Bed Mobility               General bed mobility comments: oob  Transfers   Equipment used: Rolling walker (2 wheeled) Transfers: Sit to/from Stand Sit to Stand: Min assist         General transfer comment: cues for UE/LE placement    Balance                                            ADL Overall ADL's : Needs assistance/impaired     Grooming: Wash/dry Clinical cytogeneticist: Min guard;Ambulation;BSC;RW   Toileting- Architect and Hygiene: Min guard;Sit to/from stand         General ADL Comments: ambulated to bathroom for above activities.  Pt states husband will assist as needed at home (overall set up for UB and mod A for LB). Daughter will help during the day.  Pt has used her tub bench in the past and feels comfortable using it.     Vision     Perception     Praxis      Pertinent Vitals/Pain Pain Score: 5  Pain Location: L hip Pain Descriptors / Indicators: Aching Pain Intervention(s): Limited activity within patient's tolerance;Monitored during session;Premedicated before session;Repositioned (handed off to PT)     Hand Dominance     Extremity/Trunk Assessment Upper Extremity Assessment Upper Extremity  Assessment: Overall WFL for tasks assessed           Communication Communication Communication: HOH   Cognition Arousal/Alertness: Awake/alert Behavior During Therapy: WFL for tasks assessed/performed Overall Cognitive Status: Within Functional Limits for tasks assessed                     General Comments       Exercises       Shoulder Instructions      Home Living Family/patient expects to be discharged to:: Private residence Living Arrangements: Spouse/significant other Available Help at Discharge: Family               Bathroom Shower/Tub: Tub/shower unit Shower/tub characteristics: Engineer, building services: Standard     Home Equipment: Bedside commode;Tub bench          Prior Functioning/Environment Level of Independence: Independent;Independent with assistive device(s)             OT Diagnosis: Acute pain   OT Problem List:     OT Treatment/Interventions:      OT Goals(Current goals can be found in the care plan section)    OT Frequency:     Barriers to D/C:  Co-evaluation              End of Session    Activity Tolerance: Patient tolerated treatment well Patient left:  (handed off to PT)   Time: 1610-9604 OT Time Calculation (min): 23 min Charges:  OT General Charges $OT Visit: 1 Procedure OT Evaluation $OT Eval Low Complexity: 1 Procedure G-Codes:    Aristotelis Vilardi 2016-03-16, 3:08 PM  Marica Otter, OTR/L [phone] March 16, 2016

## 2016-02-25 NOTE — Evaluation (Signed)
Physical Therapy Evaluation Patient Details Name: Tina [name] MRN: [id number] DOB: 23-May-1931 Today's Date: 02/25/2016   History of Present Illness  Pt s/p L THR with hx of R THR (posterior) and TIA  Clinical Impression  Pt s/p L THR presents with decreased L LE strength/ROM and post op pain limiting functional mobility.  Pt should progress to dc home with family assist and HHPT follow up.    Follow Up Recommendations Home health PT    Equipment Recommendations  Rolling walker with 5" wheels    Recommendations for Other Services OT consult     Precautions / Restrictions Precautions Precautions: Fall Restrictions Weight Bearing Restrictions: No Other Position/Activity Restrictions: WBAT      Mobility  Bed Mobility Overal bed mobility: Needs Assistance Bed Mobility: Supine to Sit     Supine to sit: Min assist;Mod assist     General bed mobility comments: cues for sequence and use of R LE to self assist  Transfers Overall transfer level: Needs assistance Equipment used: Rolling walker (2 wheeled) Transfers: Sit to/from Stand Sit to Stand: Min assist         General transfer comment: cues for LE management and use of UEs to self assist  Ambulation/Gait Ambulation/Gait assistance: Min assist Ambulation Distance (Feet): 58 Feet Assistive device: Rolling walker (2 wheeled) Gait Pattern/deviations: Step-to pattern;Decreased step length - right;Decreased step length - left;Shuffle;Trunk flexed Gait velocity: decr Gait velocity interpretation: Below normal speed for age/gender General Gait Details: cues for sequence, posture and position from [employer]            Wheelchair Mobility    Modified Rankin (Stroke Patients Only)       Balance                                             Pertinent Vitals/Pain Pain Assessment: 0-10 Pain Score: 4  Pain Location: L hip/thigh Pain Descriptors / Indicators: Aching;Sore Pain  Intervention(s): Limited activity within patient's tolerance;Premedicated before session;Monitored during session;Ice applied    Home Living Family/patient expects to be discharged to:: Private residence Living Arrangements: Spouse/significant other Available Help at Discharge: Family Type of Home: House Home Access: Level entry     Home Layout: Two level;Able to live on main level with bedroom/bathroom Home Equipment: Gilmer Mor - single point      Prior Function Level of Independence: Independent;Independent with assistive device(s)               Hand Dominance        Extremity/Trunk Assessment   Upper Extremity Assessment: Overall WFL for tasks assessed           Lower Extremity Assessment: LLE deficits/detail   LLE Deficits / Details: Strength at hip 2+/5 with AAROM at hip to 85 flex and 20 abd  Cervical / Trunk Assessment: Kyphotic  Communication   Communication: HOH  Cognition Arousal/Alertness: Awake/alert Behavior During Therapy: WFL for tasks assessed/performed Overall Cognitive Status: Within Functional Limits for tasks assessed                      General Comments      Exercises Total Joint Exercises Ankle Circles/Pumps: AROM;Both;15 reps;Supine Quad Sets: AROM;Both;10 reps;Supine Heel Slides: AAROM;Left;20 reps;Supine Hip ABduction/ADduction: AAROM;Left;15 reps;Supine      Assessment/Plan    PT Assessment Patient needs continued PT services  PT  Diagnosis Difficulty walking   PT Problem List Decreased strength;Decreased range of motion;Decreased activity tolerance;Decreased mobility;Decreased knowledge of use of DME;Pain  PT Treatment Interventions DME instruction;Gait training;Stair training;Functional mobility training;Therapeutic activities;Therapeutic exercise;Patient/family education   PT Goals (Current goals can be found in the Care Plan section) Acute Rehab PT Goals Patient Stated Goal: Walk without pain PT Goal Formulation:  With patient Time For Goal Achievement: 02/29/16 Potential to Achieve Goals: Good    Frequency 7X/week   Barriers to discharge        Co-evaluation               End of Session Equipment Utilized During Treatment: Gait belt Activity Tolerance: Patient tolerated treatment well Patient left: in chair;with call bell/phone within reach;with chair alarm set Nurse Communication: Mobility status         Time: 1610-9604 PT Time Calculation (min) (ACUTE ONLY): 38 min   Charges:   PT Evaluation $PT Eval Low Complexity: 1 Procedure PT Treatments $Gait Training: 8-22 mins $Therapeutic Exercise: 8-22 mins   PT G Codes:        Macy Polio 03/18/2016, 12:15 PM

## 2016-02-25 NOTE — Care Management Note (Signed)
Case Management Note  Patient Details  Name: Tina [name] MRN: [id number] Date of Birth: 1931/03/25  Subjective/Objective:                  LEFT TOTAL HIP ARTHROPLASTY ANTERIOR APPROACH (Left) Action/Plan: Discharge planning Expected Discharge Date: 02/26/16                 Expected Discharge Plan:  [place]  In-House Referral:     Discharge planning Services  CM Consult  Post Acute Care Choice:  Home Health Choice offered to:  Patient  DME Arranged:  Walker rolling DME Agency:  [place]:  PT [place] Agency:  [facility] (now [facility])  Status of Service:  Completed, signed off  If discussed at [employer] of Stay Meetings, dates discussed:    Additional Comments: CM met with pt in room to offer choice of home health agency.  Pt chooses [facility] to render HHPT.  Referral given to [employer], Tim.  CM notified [facility] [employer]ME rep, Jermaine to please deliver the rolling walker to room prior to discharge.  NO other CM needs were communicated. Dellie Catholic, RN 02/25/2016, 11:27 AM

## 2016-02-25 NOTE — Discharge Instructions (Addendum)
° °Dr. Frank Aluisio °Total Joint Specialist °[facility] °[address] °[place], [place] [address] °[phone] ° °ANTERIOR APPROACH TOTAL HIP REPLACEMENT POSTOPERATIVE DIRECTIONS ° ° °Hip Rehabilitation, Guidelines Following Surgery  °The results of a hip operation are greatly improved after range of motion and muscle strengthening exercises. Follow all safety measures which are given to protect your hip. If any of these exercises cause increased pain or swelling in your joint, decrease the amount until you are comfortable again. Then slowly increase the exercises. Call your caregiver if you have problems or questions.  ° °HOME CARE INSTRUCTIONS  °Remove items at home which could result in a fall. This includes throw rugs or furniture in walking pathways.  °· ICE to the affected hip every three hours for 30 minutes at a time and then as needed for pain and swelling.  Continue to use ice on the hip for pain and swelling from surgery. You may notice swelling that will progress down to the foot and ankle.  This is normal after surgery.  Elevate the leg when you are not up walking on it.   °· Continue to use the breathing machine which will help keep your temperature down.  It is common for your temperature to cycle up and down following surgery, especially at night when you are not up moving around and exerting yourself.  The breathing machine keeps your lungs expanded and your temperature down. ° ° °DIET °You may resume your previous home diet once your are discharged from the hospital. ° °DRESSING / WOUND CARE / SHOWERING °You may shower 3 days after surgery, but keep the wounds dry during showering.  You may use an occlusive plastic wrap (Press'n Seal for example), NO SOAKING/SUBMERGING IN THE BATHTUB.  If the bandage gets wet, change with a clean dry gauze.  If the incision gets wet, pat the wound dry with a clean towel. °You may start showering once you are discharged home but do not  submerge the incision under water. Just pat the incision dry and apply a dry gauze dressing on daily. °Change the surgical dressing daily and reapply a dry dressing each time. ° °ACTIVITY °Walk with your walker as instructed. °Use walker as long as suggested by your caregivers. °Avoid periods of inactivity such as sitting longer than an hour when not asleep. This helps prevent blood clots.  °You may resume a sexual relationship in one month or when given the OK by your doctor.  °You may return to work once you are cleared by your doctor.  °Do not drive a car for 6 weeks or until released by you surgeon.  °Do not drive while taking narcotics. ° °WEIGHT BEARING °Weight bearing as tolerated with assist device (walker, cane, etc) as directed, use it as long as suggested by your surgeon or therapist, typically at least 4-6 weeks. ° °POSTOPERATIVE CONSTIPATION PROTOCOL °Constipation - defined medically as fewer than three stools per week and severe constipation as less than one stool per week. ° °One of the most common issues patients have following surgery is constipation.  Even if you have a regular bowel pattern at home, your normal regimen is likely to be disrupted due to multiple reasons following surgery.  Combination of anesthesia, postoperative narcotics, change in appetite and fluid intake all can affect your bowels.  In order to avoid complications following surgery, here are some recommendations in order to help you during your recovery period. ° °Colace (docusate) - Pick up an over-the-counter   form of Colace or another stool softener and take twice a day as long as you are requiring postoperative pain medications.  Take with a full glass of water daily.  If you experience loose stools or diarrhea, hold the colace until you stool forms back up.  If your symptoms do not get better within 1 week or if they get worse, check with your doctor. ° °Dulcolax (bisacodyl) - Pick up over-the-counter and take as directed  by the product packaging as needed to assist with the movement of your bowels.  Take with a full glass of water.  Use this product as needed if not relieved by Colace only.  ° °MiraLax (polyethylene glycol) - Pick up over-the-counter to have on hand.  MiraLax is a solution that will increase the amount of water in your bowels to assist with bowel movements.  Take as directed and can mix with a glass of water, juice, soda, coffee, or tea.  Take if you go more than two days without a movement. °Do not use MiraLax more than once per day. Call your doctor if you are still constipated or irregular after using this medication for 7 days in a row. ° °If you continue to have problems with postoperative constipation, please contact the office for further assistance and recommendations.  If you experience "the worst abdominal pain ever" or develop nausea or vomiting, please contact the office immediatly for further recommendations for treatment. ° °ITCHING ° If you experience itching with your medications, try taking only a single pain pill, or even half a pain pill at a time.  You can also use Benadryl over the counter for itching or also to help with sleep.  ° °TED HOSE STOCKINGS °Wear the elastic stockings on both legs for three weeks following surgery during the day but you may remove then at night for sleeping. ° °MEDICATIONS °See your medication summary on the “After Visit Summary” that the nursing staff will review with you prior to discharge.  You may have some home medications which will be placed on hold until you complete the course of blood thinner medication.  It is important for you to complete the blood thinner medication as prescribed by your surgeon.  Continue your approved medications as instructed at time of discharge. ° °PRECAUTIONS °If you experience chest pain or shortness of breath - call 911 immediately for transfer to the hospital emergency department.  °If you develop a fever greater that 101 F,  purulent drainage from wound, increased redness or drainage from wound, foul odor from the wound/dressing, or calf pain - CONTACT YOUR SURGEON.   °                                                °FOLLOW-UP APPOINTMENTS °Make sure you keep all of your appointments after your operation with your surgeon and caregivers. You should call the office at the above phone number and make an appointment for approximately two weeks after the date of your surgery or on the date instructed by your surgeon outlined in the "After Visit Summary". ° °RANGE OF MOTION AND STRENGTHENING EXERCISES  °These exercises are designed to help you keep full movement of your hip joint. Follow your caregiver's or physical therapist's instructions. Perform all exercises about fifteen times, three times per day or as directed. Exercise both hips, even if you   have had only one joint replacement. These exercises can be done on a training (exercise) mat, on the floor, on a table or on a bed. Use whatever works the best and is most comfortable for you. Use music or television while you are exercising so that the exercises are a pleasant break in your day. This will make your life better with the exercises acting as a break in routine you can look forward to.  Lying on your back, slowly slide your foot toward your buttocks, raising your knee up off the floor. Then slowly slide your foot back down until your leg is straight again.  Lying on your back spread your legs as far apart as you can without causing discomfort.  Lying on your side, raise your upper leg and foot straight up from the floor as far as is comfortable. Slowly lower the leg and repeat.  Lying on your back, tighten up the muscle in the front of your thigh (quadriceps muscles). You can do this by keeping your leg straight and trying to raise your heel off the floor. This helps strengthen the largest muscle supporting your knee.  Lying on your back, tighten up the muscles of your  buttocks both with the legs straight and with the knee bent at a comfortable angle while keeping your heel on the floor.   IF YOU ARE TRANSFERRED TO A SKILLED REHAB FACILITY If the patient is transferred to a skilled rehab facility following release from the hospital, a list of the current medications will be sent to the facility for the patient to continue.  When discharged from the skilled rehab facility, please have the facility set up the patient's Home Health Physical Therapy prior to being released. Also, the skilled facility will be responsible for providing the patient with their medications at time of release from the facility to include their pain medication, the muscle relaxants, and their blood thinner medication. If the patient is still at the rehab facility at time of the two week follow up appointment, the skilled rehab facility will also need to assist the patient in arranging follow up appointment in our office and any transportation needs.  MAKE SURE YOU:  Understand these instructions.  Get help right away if you are not doing well or get worse.    Pick up stool softner and laxative for home use following surgery while on pain medications. Do not submerge incision under water. Please use good hand washing techniques while changing dressing each day. May shower starting three days after surgery. Please use a clean towel to pat the incision dry following showers. Continue to use ice for pain and swelling after surgery. Do not use any lotions or creams on the incision until instructed by your surgeon.  Take Xarelto for two and a half more weeks, then discontinue Xarelto. Once the patient has completed the blood thinner regimen, then take a Baby 81 mg Aspirin daily for three more weeks.   Information on my medicine - XARELTO (Rivaroxaban)  This medication education was reviewed with me or my healthcare representative as part of my discharge preparation.  The pharmacist that  spoke with me during my hospital stay was:  Claris Che Hitchins, Student-PharmD  Why was Xarelto prescribed for you? Xarelto was prescribed for you to reduce the risk of blood clots forming after orthopedic surgery. The medical term for these abnormal blood clots is venous thromboembolism (VTE).  What do you need to know about xarelto ? Take your Xarelto ONCE  DAILY at the same time every day. You may take it either with or without food.  If you have difficulty swallowing the tablet whole, you may crush it and mix in applesauce just prior to taking your dose.  Take Xarelto exactly as prescribed by your doctor and DO NOT stop taking Xarelto without talking to the doctor who prescribed the medication.  Stopping without other VTE prevention medication to take the place of Xarelto may increase your risk of developing a clot.  After discharge, you should have regular check-up appointments with your healthcare provider that is prescribing your Xarelto.    What do you do if you miss a dose? If you miss a dose, take it as soon as you remember on the same day then continue your regularly scheduled once daily regimen the next day. Do not take two doses of Xarelto on the same day.   Important Safety Information A possible side effect of Xarelto is bleeding. You should call your healthcare provider right away if you experience any of the following: ? Bleeding from an injury or your nose that does not stop. ? Unusual colored urine (red or dark brown) or unusual colored stools (red or black). ? Unusual bruising for unknown reasons. ? A serious fall or if you hit your head (even if there is no bleeding).  Some medicines may interact with Xarelto and might increase your risk of bleeding while on Xarelto. To help avoid this, consult your healthcare provider or pharmacist prior to using any new prescription or non-prescription medications, including herbals, vitamins, non-steroidal anti-inflammatory  drugs (NSAIDs) and supplements.  This website has more information on Xarelto: [email or web address]

## 2016-02-26 LAB — BASIC METABOLIC PANEL
Anion gap: 6 (ref 5–15)
BUN: 22 mg/dL — ABNORMAL HIGH (ref 6–20)
CALCIUM: 8.1 mg/dL — AB (ref 8.9–10.3)
CHLORIDE: 105 mmol/L (ref 101–111)
CO2: 27 mmol/L (ref 22–32)
CREATININE: 1.01 mg/dL — AB (ref 0.44–1.00)
GFR calc Af Amer: 58 mL/min — ABNORMAL LOW (ref >60)
GFR calc non Af Amer: 50 mL/min — ABNORMAL LOW (ref >60)
GLUCOSE: 163 mg/dL — AB (ref 65–99)
Potassium: 4.4 mmol/L (ref 3.5–5.1)
Sodium: 138 mmol/L (ref 135–145)

## 2016-02-26 LAB — CBC
HEMATOCRIT: 25.1 % — AB (ref 36.0–46.0)
HEMOGLOBIN: 8 g/dL — AB (ref 12.0–15.0)
MCH: 29.2 pg (ref 26.0–34.0)
MCHC: 31.9 g/dL (ref 30.0–36.0)
MCV: 91.6 fL (ref 78.0–100.0)
Platelets: 222 10*3/uL (ref 150–400)
RBC: 2.74 MIL/uL — ABNORMAL LOW (ref 3.87–5.11)
RDW: 14.2 % (ref 11.5–15.5)
WBC: 11.4 10*3/uL — ABNORMAL HIGH (ref 4.0–10.5)

## 2016-02-26 MED ORDER — HYDROCODONE-ACETAMINOPHEN 5-325 MG PO TABS: 1.0000 | ORAL_TABLET | ORAL | 0 refills | Status: DC | PRN

## 2016-02-26 MED ORDER — HYDROCODONE-ACETAMINOPHEN 5-325 MG PO TABS
1.0000 | ORAL_TABLET | ORAL | Status: DC | PRN
  Administered 2016-02-26 (×2): 1 via ORAL
  Filled 2016-02-26 (×2): qty 1

## 2016-02-26 MED ORDER — POLYSACCHARIDE IRON COMPLEX 150 MG PO CAPS: 150.0000 mg | ORAL_CAPSULE | Freq: Two times a day (BID) | ORAL | 0 refills | Status: DC

## 2016-02-26 MED ORDER — POLYSACCHARIDE IRON COMPLEX 150 MG PO CAPS
150.0000 mg | ORAL_CAPSULE | Freq: Two times a day (BID) | ORAL | Status: DC
  Administered 2016-02-26: 150 mg via ORAL
  Filled 2016-02-26: qty 1

## 2016-02-26 MED ORDER — RIVAROXABAN 10 MG PO TABS: 10.0000 mg | ORAL_TABLET | Freq: Every day | ORAL | 0 refills | Status: DC

## 2016-02-26 MED ORDER — METHOCARBAMOL 500 MG PO TABS: 500.0000 mg | ORAL_TABLET | Freq: Four times a day (QID) | ORAL | 0 refills | Status: DC | PRN

## 2016-02-26 NOTE — Progress Notes (Signed)
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  Also assisted to bathroom.  Follow Up Recommendations  Home health PT     Equipment Recommendations  Rolling walker with 5" wheels    Recommendations for Other Services       Precautions / Restrictions Precautions Precautions: Fall Restrictions Weight Bearing Restrictions: No Other Position/Activity Restrictions: WBAT    Mobility  Bed Mobility               General bed mobility comments: Pt OOB in recliner  Transfers Overall transfer level: Needs assistance Equipment used: Rolling walker (2 wheeled) Transfers: Sit to/from Stand Sit to Stand: Min assist         General transfer comment: 25% VC's on proper hand placement and increased time  Ambulation/Gait Ambulation/Gait assistance: Min assist;Min guard Ambulation Distance (Feet): 20 Feet Assistive device: Rolling walker (2 wheeled) Gait Pattern/deviations: Step-to pattern;Decreased stance time - left Gait velocity: decreased   General Gait Details: cues for sequence, posture and position from RW.  Instructed spouse "Hands On" safe handling   Stairs Stairs: Yes Stairs assistance: Min assist Stair Management: One rail Right;Step to pattern;Forwards Number of Stairs: 2 General stair comments: with daughter and 50% VC's on proper tech and safety  Wheelchair Mobility    Modified Rankin (Stroke Patients Only)       Balance                                    Cognition Arousal/Alertness: Awake/alert Behavior During Therapy: WFL for tasks assessed/performed Overall Cognitive Status: Within Functional Limits for tasks assessed                      Exercises      General Comments        Pertinent Vitals/Pain Pain Assessment: 0-10 Pain Score: 4  Pain Location: L hip Pain Descriptors / Indicators: Aching;Discomfort;Grimacing Pain Intervention(s): Monitored during session;Repositioned;Ice applied    Home Living                      Prior Function            PT Goals (current goals can now be found in the care plan section) Progress towards PT goals: Progressing toward goals    Frequency  7X/week    PT Plan Current plan remains appropriate    Co-evaluation             End of Session Equipment Utilized During Treatment: Gait belt Activity Tolerance: Patient tolerated treatment well Patient left: in chair;with call bell/phone within reach;with family/visitor present     Time: 1335-1400 PT Time Calculation (min) (ACUTE ONLY): 25 min  Charges:  $Gait Training: 8-22 mins $Therapeutic Activity: 8-22 mins                    G Codes:      Felecia Shelling  PTA [facility]  Acute  Rehab Pager      [phone]

## 2016-02-26 NOTE — Discharge Summary (Signed)
Physician Discharge Summary   Patient ID: Tina [name] MRN: [id number] DOB/AGE: 08-09-1930 82 y.o.  Admit date: 02/24/2016 Discharge date: 02/26/2016  Primary Diagnosis:  Osteoarthritis of the Left hip.   Admission Diagnoses:  Past Medical History:  Diagnosis Date  . Canker sore   . Cystocele   . Depression   . Diverticulosis   . Esophageal dysmotility   . Esophageal stricture   . GERD (gastroesophageal reflux disease)   . Heartburn   . Hypertension   . TIA (transient ischemic attack)    Discharge Diagnoses:   Principal Problem:   OA (osteoarthritis) of hip  Estimated body mass index is 28.17 kg/m as calculated from the following:   Height as of this encounter: '5\' 2"'  (1.575 m).   Weight as of this encounter: 69.9 kg (154 lb).  Procedure(s) (LRB): LEFT TOTAL HIP ARTHROPLASTY ANTERIOR APPROACH (Left)   Consults: None  HPI: Tina [name] is a 82 y.o. female who has advanced end-  stage arthritis of their Left  hip with progressively worsening pain and  dysfunction.The patient has failed nonoperative management and presents for  total hip arthroplasty.   Laboratory Data: Admission on 02/24/2016  Component Date Value Ref Range Status  . WBC 02/25/2016 11.2* 4.0 - 10.5 K/uL Final  . RBC 02/25/2016 2.69* 3.87 - 5.11 MIL/uL Final  . Hemoglobin 02/25/2016 7.8* 12.0 - 15.0 g/dL Final  . HCT 02/25/2016 24.4* 36.0 - 46.0 % Final  . MCV 02/25/2016 90.7  78.0 - 100.0 fL Final  . MCH 02/25/2016 29.0  26.0 - 34.0 pg Final  . MCHC 02/25/2016 32.0  30.0 - 36.0 g/dL Final  . RDW 02/25/2016 13.5  11.5 - 15.5 % Final  . Platelets 02/25/2016 211  150 - 400 K/uL Final  . Sodium 02/25/2016 139  135 - 145 mmol/L Final  . Potassium 02/25/2016 4.5  3.5 - 5.1 mmol/L Final  . Chloride 02/25/2016 108  101 - 111 mmol/L Final  . CO2 02/25/2016 26  22 - 32 mmol/L Final  . Glucose, Bld 02/25/2016 145* 65 - 99 mg/dL Final  . BUN 02/25/2016 17  6 - 20 mg/dL Final  . Creatinine, Ser  02/25/2016 0.82  0.44 - 1.00 mg/dL Final  . Calcium 02/25/2016 7.8* 8.9 - 10.3 mg/dL Final  . GFR calc non Af Amer 02/25/2016 >60  >60 mL/min Final  . GFR calc Af Amer 02/25/2016 >60  >60 mL/min Final   Comment: (NOTE) The eGFR has been calculated using the CKD EPI equation. This calculation has not been validated in all clinical situations. eGFR's persistently <60 mL/min signify possible Chronic Kidney Disease.   . Anion gap 02/25/2016 5  5 - 15 Final  . WBC 02/26/2016 11.4* 4.0 - 10.5 K/uL Final  . RBC 02/26/2016 2.74* 3.87 - 5.11 MIL/uL Final  . Hemoglobin 02/26/2016 8.0* 12.0 - 15.0 g/dL Final  . HCT 02/26/2016 25.1* 36.0 - 46.0 % Final  . MCV 02/26/2016 91.6  78.0 - 100.0 fL Final  . MCH 02/26/2016 29.2  26.0 - 34.0 pg Final  . MCHC 02/26/2016 31.9  30.0 - 36.0 g/dL Final  . RDW 02/26/2016 14.2  11.5 - 15.5 % Final  . Platelets 02/26/2016 222  150 - 400 K/uL Final  . Sodium 02/26/2016 138  135 - 145 mmol/L Final  . Potassium 02/26/2016 4.4  3.5 - 5.1 mmol/L Final  . Chloride 02/26/2016 105  101 - 111 mmol/L Final  . CO2 02/26/2016 27  22 -  32 mmol/L Final  . Glucose, Bld 02/26/2016 163* 65 - 99 mg/dL Final  . BUN 02/26/2016 22* 6 - 20 mg/dL Final  . Creatinine, Ser 02/26/2016 1.01* 0.44 - 1.00 mg/dL Final  . Calcium 02/26/2016 8.1* 8.9 - 10.3 mg/dL Final  . GFR calc non Af Amer 02/26/2016 50* >60 mL/min Final  . GFR calc Af Amer 02/26/2016 58* >60 mL/min Final   Comment: (NOTE) The eGFR has been calculated using the CKD EPI equation. This calculation has not been validated in all clinical situations. eGFR's persistently <60 mL/min signify possible Chronic Kidney Disease.   Georgiann Hahn gap 02/26/2016 6  5 - 15 Final  Hospital Outpatient Visit on 02/17/2016  Component Date Value Ref Range Status  . MRSA, PCR 02/17/2016 NEGATIVE  NEGATIVE Final  . Staphylococcus aureus 02/17/2016 NEGATIVE  NEGATIVE Final   Comment:        The Xpert SA Assay (FDA approved for NASAL  specimens in patients over 59 years of age), is one component of a comprehensive surveillance program.  Test performance has been validated by [facility] for patients greater than or equal to 3 year old. It is not intended to diagnose infection nor to guide or monitor treatment.   . ABO/RH(D) 02/24/2016 A POS   Final  . Antibody Screen 02/24/2016 NEG   Final  . Sample Expiration 02/24/2016 02/27/2016   Final  . Extend sample reason 02/24/2016 NO TRANSFUSIONS OR PREGNANCY IN THE PAST 3 MONTHS   Final  . Sodium 02/17/2016 142  135 - 145 mmol/L Final  . Potassium 02/17/2016 5.6* 3.5 - 5.1 mmol/L Final  . Chloride 02/17/2016 106  101 - 111 mmol/L Final  . CO2 02/17/2016 30  22 - 32 mmol/L Final  . Glucose, Bld 02/17/2016 91  65 - 99 mg/dL Final  . BUN 02/17/2016 21* 6 - 20 mg/dL Final  . Creatinine, Ser 02/17/2016 0.78  0.44 - 1.00 mg/dL Final  . Calcium 02/17/2016 9.1  8.9 - 10.3 mg/dL Final  . GFR calc non Af Amer 02/17/2016 >60  >60 mL/min Final  . GFR calc Af Amer 02/17/2016 >60  >60 mL/min Final   Comment: (NOTE) The eGFR has been calculated using the CKD EPI equation. This calculation has not been validated in all clinical situations. eGFR's persistently <60 mL/min signify possible Chronic Kidney Disease.   . Anion gap 02/17/2016 6  5 - 15 Final  . WBC 02/17/2016 7.8  4.0 - 10.5 K/uL Final  . RBC 02/17/2016 3.66* 3.87 - 5.11 MIL/uL Final  . Hemoglobin 02/17/2016 10.5* 12.0 - 15.0 g/dL Final  . HCT 02/17/2016 34.1* 36.0 - 46.0 % Final  . MCV 02/17/2016 93.2  78.0 - 100.0 fL Final  . MCH 02/17/2016 28.7  26.0 - 34.0 pg Final  . MCHC 02/17/2016 30.8  30.0 - 36.0 g/dL Final  . RDW 02/17/2016 13.2  11.5 - 15.5 % Final  . Platelets 02/17/2016 271  150 - 400 K/uL Final  . ABO/RH(D) 02/17/2016 A POS   Final     X-Rays:Dg Pelvis Portable  Result Date: 02/24/2016 CLINICAL DATA:  Postop left hip arthroplasty. EXAM: PORTABLE PELVIS 1-2 VIEWS COMPARISON:  None. FINDINGS: New left  hip total arthroplasty appears well aligned. The femoral and acetabular components appear well seated. There is no acute fracture or evidence of an operative complication. Right hip total arthroplasties also well-seated and aligned. IMPRESSION: Well-positioned left hip total arthroplasty. Electronically Signed   By: Dedra Skeens.D.  On: 02/24/2016 12:56  Dg C-arm 1-60 Min-no Report  Result Date: 02/24/2016 CLINICAL DATA: surgery C-ARM 1-60 MINUTES Fluoroscopy was utilized by the requesting physician.  No radiographic interpretation.    EKG:No orders found for this or any previous visit.   Hospital Course: Patient was admitted to [facility] and taken to the OR and underwent the above state procedure without complications.  Patient tolerated the procedure well and was later transferred to the recovery room and then to the orthopaedic floor for postoperative care.  They were given PO and IV analgesics for pain control following their surgery.  They were given 24 hours of postoperative antibiotics of  Anti-infectives    Start     Dose/Rate Route Frequency Ordered Stop   02/24/16 1700  ceFAZolin (ANCEF) IVPB 2g/100 mL premix     2 g 200 mL/hr over 30 Minutes Intravenous Every 6 hours 02/24/16 1441 02/24/16 2207   02/24/16 0656  ceFAZolin (ANCEF) IVPB 2g/100 mL premix     2 g 200 mL/hr over 30 Minutes Intravenous On call to O.R. 02/24/16 7494 02/24/16 1057     and started on DVT prophylaxis in the form of Xarelto.   PT and OT were ordered for total hip protocol.  The patient was allowed to be WBAT with therapy. Discharge planning was consulted to help with postop disposition and equipment needs.  Patient had a decent night on the evening of surgery.  They started to get up OOB with therapy on day one. HGB was down to 7.8 on day one. She had started low at 10.5. Hemovac drain was pulled without difficulty.  Continued to work with therapy into day two.  Dressing was changed on day two and  the incision was healing well.  HGB on day two was stable at 8.0 and without symptoms. Patient was seen in rounds by Dr. Wynelle Link and was setup to go home following therapy goals.  Added iron supplement.  Diet - Cardiac diet Follow up - in 2 weeks Activity - WBAT Disposition - Home Condition Upon Discharge - improving D/C Meds - See DC Summary DVT Prophylaxis - Xarelto   Discharge Instructions    Call MD / Call 911    Complete by:  As directed   If you experience chest pain or shortness of breath, CALL 911 and be transported to the hospital emergency room.  If you develope a fever above 101 F, pus (white drainage) or increased drainage or redness at the wound, or calf pain, call your surgeon's office.   Change dressing    Complete by:  As directed   You may change your dressing dressing daily with sterile 4 x 4 inch gauze dressing and paper tape.  Do not submerge the incision under water.   Constipation Prevention    Complete by:  As directed   Drink plenty of fluids.  Prune juice may be helpful.  You may use a stool softener, such as Colace (over the counter) 100 mg twice a day.  Use MiraLax (over the counter) for constipation as needed.   Diet - low sodium heart healthy    Complete by:  As directed   Diet Carb Modified    Complete by:  As directed   Discharge instructions    Complete by:  As directed   Pick up stool softner and laxative for home use following surgery while on pain medications. Do not submerge incision under water. Please use good hand washing techniques while changing dressing  each day. May shower starting three days after surgery. Please use a clean towel to pat the incision dry following showers. Continue to use ice for pain and swelling after surgery. Do not use any lotions or creams on the incision until instructed by your surgeon.   Postoperative Constipation Protocol  Constipation - defined medically as fewer than three stools per week and severe constipation  as less than one stool per week.  One of the most common issues patients have following surgery is constipation.  Even if you have a regular bowel pattern at home, your normal regimen is likely to be disrupted due to multiple reasons following surgery.  Combination of anesthesia, postoperative narcotics, change in appetite and fluid intake all can affect your bowels.  In order to avoid complications following surgery, here are some recommendations in order to help you during your recovery period.  Colace (docusate) - Pick up an over-the-counter form of Colace or another stool softener and take twice a day as long as you are requiring postoperative pain medications.  Take with a full glass of water daily.  If you experience loose stools or diarrhea, hold the colace until you stool forms back up.  If your symptoms do not get better within 1 week or if they get worse, check with your doctor.  Dulcolax (bisacodyl) - Pick up over-the-counter and take as directed by the product packaging as needed to assist with the movement of your bowels.  Take with a full glass of water.  Use this product as needed if not relieved by Colace only.   MiraLax (polyethylene glycol) - Pick up over-the-counter to have on hand.  MiraLax is a solution that will increase the amount of water in your bowels to assist with bowel movements.  Take as directed and can mix with a glass of water, juice, soda, coffee, or tea.  Take if you go more than two days without a movement. Do not use MiraLax more than once per day. Call your doctor if you are still constipated or irregular after using this medication for 7 days in a row.  If you continue to have problems with postoperative constipation, please contact the office for further assistance and recommendations.  If you experience "the worst abdominal pain ever" or develop nausea or vomiting, please contact the office immediatly for further recommendations for treatment.   Take Xarelto for  two and a half more weeks, then discontinue Xarelto. Once the patient has completed the blood thinner regimen, then take a Baby 81 mg Aspirin daily for three more weeks.    Do not sit on low chairs, stoools or toilet seats, as it may be difficult to get up from low surfaces    Complete by:  As directed   Driving restrictions    Complete by:  As directed   No driving until released by the physician.   Increase activity slowly as tolerated    Complete by:  As directed   Lifting restrictions    Complete by:  As directed   No lifting until released by the physician.   Patient may shower    Complete by:  As directed   You may shower without a dressing once there is no drainage.  Do not wash over the wound.  If drainage remains, do not shower until drainage stops.   TED hose    Complete by:  As directed   Use stockings (TED hose) for 3 weeks on both leg(s).  You may remove  them at night for sleeping.   Weight bearing as tolerated    Complete by:  As directed   Laterality:  left   Extremity:  Lower       Medication List    STOP taking these medications   alendronate 70 MG tablet Commonly known as:  FOSAMAX   BIOTIN PO   HAIR/SKIN/NAILS Tabs   HYDROcodone-homatropine 5-1.5 MG/5ML syrup Commonly known as:  HYCODAN   multivitamin with minerals Tabs tablet   naproxen sodium 220 MG tablet Commonly known as:  ANAPROX   VITAMIN A PO   VITAMIN D-3 PO     TAKE these medications   atorvastatin 10 MG tablet Commonly known as:  LIPITOR Take 10 mg by mouth daily.   citalopram 20 MG tablet Commonly known as:  CELEXA Take 20 mg by mouth daily.   HYDROcodone-acetaminophen 5-325 MG tablet Commonly known as:  NORCO/VICODIN Take 1-2 tablets by mouth every 4 (four) hours as needed for moderate pain.   iron polysaccharides 150 MG capsule Commonly known as:  NIFEREX Take 1 capsule (150 mg total) by mouth 2 (two) times daily.   magnesium hydroxide 311 MG Chew chewable tablet Commonly  known as:  PHILLIPS CHEWS Chew 311 mg by mouth daily as needed. For constipation   methocarbamol 500 MG tablet Commonly known as:  ROBAXIN Take 1 tablet (500 mg total) by mouth every 6 (six) hours as needed for muscle spasms.   omeprazole 20 MG capsule Commonly known as:  PRILOSEC Take 20 mg by mouth daily as needed (heartburn).   polyethylene glycol powder powder Commonly known as:  GLYCOLAX/MIRALAX Take 17 g by mouth daily as needed. For constipation   rivaroxaban 10 MG Tabs tablet Commonly known as:  XARELTO Take 1 tablet (10 mg total) by mouth daily with breakfast. Take Xarelto for two and a half more weeks, then discontinue Xarelto. Once the patient has completed the blood thinner regimen, then take a Baby 81 mg Aspirin daily for three more weeks.   triamcinolone 0.1 % paste Commonly known as:  KENALOG Place 1 application onto teeth 2 (two) times daily as needed. For sores   zolpidem 5 MG tablet Commonly known as:  AMBIEN Take 2.5 mg by mouth at bedtime as needed. For sleep      Follow-up Information    [facility] .   Why:  home health physical therapy Contact information: [address] [place] [address] [phone]        Inc[facility] - [facility] .   Why:  rolling walker Contact information: [place] [address] [phone]        Gearlean Alf, MD. Schedule an appointment as soon as possible for a visit on 03/09/2016.   Specialty:  Orthopedic Surgery Why:  Call office at [phone] to setup appointment with Dr. Anne Fu staff. Contact information: [address] [place] [address] [phone]           Signed: Arlee Muslim, PA-C Orthopaedic Surgery 02/26/2016, 8:13 AM

## 2016-02-26 NOTE — Progress Notes (Signed)
Physical Therapy Treatment Patient Details Name: Tina [name] MRN: [id number] DOB: 1931/04/12 Today's Date: 02/26/2016    History of Present Illness Pt s/p L THR with hx of R THR (posterior) and TIA    PT Comments    POD # 2 am session Assisted out of recliner to amb with spouse under direction of therapist re safe handling and proper use AD.  Pt required increased time and family had multiple questions.  Lunch tray arrive, so will need to return to address stair training.  Follow Up Recommendations  Home health PT     Equipment Recommendations  Rolling walker with 5" wheels    Recommendations for Other Services       Precautions / Restrictions Precautions Precautions: Fall Restrictions Weight Bearing Restrictions: No Other Position/Activity Restrictions: WBAT    Mobility  Bed Mobility               General bed mobility comments: Pt OOB in recliner  Transfers Overall transfer level: Needs assistance Equipment used: Rolling walker (2 wheeled) Transfers: Sit to/from Stand Sit to Stand: Min assist         General transfer comment: 25% VC's on proper hand placement and increased time  Ambulation/Gait Ambulation/Gait assistance: Min assist;Min guard Ambulation Distance (Feet): 64 Feet Assistive device: Rolling walker (2 wheeled) Gait Pattern/deviations: Step-to pattern;Decreased stance time - left Gait velocity: decreased   General Gait Details: cues for sequence, posture and position from RW.  Instructed spouse "Hands On" safe handling   Stairs            Wheelchair Mobility    Modified Rankin (Stroke Patients Only)       Balance                                    Cognition Arousal/Alertness: Awake/alert Behavior During Therapy: WFL for tasks assessed/performed Overall Cognitive Status: Within Functional Limits for tasks assessed                      Exercises      General Comments        Pertinent  Vitals/Pain Pain Assessment: 0-10 Pain Score: 4  Pain Location: L hip Pain Descriptors / Indicators: Aching;Discomfort;Grimacing Pain Intervention(s): Monitored during session;Repositioned;Ice applied    Home Living                      Prior Function            PT Goals (current goals can now be found in the care plan section) Progress towards PT goals: Progressing toward goals    Frequency  7X/week    PT Plan Current plan remains appropriate    Co-evaluation             End of Session Equipment Utilized During Treatment: Gait belt Activity Tolerance: Patient tolerated treatment well Patient left: in chair;with call bell/phone within reach;with family/visitor present     Time: 3810-1751 PT Time Calculation (min) (ACUTE ONLY): 39 min  Charges:  $Gait Training: 23-37 mins $Therapeutic Activity: 8-22 mins                    G Codes:      Felecia Shelling  PTA [facility]  Acute  Rehab Pager      [phone]

## 2016-02-26 NOTE — Progress Notes (Addendum)
   Subjective: 2 Days Post-Op Procedure(s) (LRB): LEFT TOTAL HIP ARTHROPLASTY ANTERIOR APPROACH (Left) Patient reports pain as mild and moderate last night after walking a fair amount yesterday. Pain is better this morning. Patient seen in rounds with Dr. Lequita Halt. Patient is well, but has had some minor complaints of pain in the hip and thigh, requiring pain medications Patient is ready to go home later this morning.  Objective: Vital signs in last 24 hours: Temp:  [98.3 F (36.8 C)-100 F (37.8 C)] 98.7 F (37.1 C) (07/28 0518) Pulse Rate:  [72-109] 109 (07/28 0518) Resp:  [12-14] 14 (07/28 0518) BP: (107-128)/(43-63) 107/63 (07/28 0518) SpO2:  [82 %-96 %] 95 % (07/28 0519)  Intake/Output from previous day:  Intake/Output Summary (Last 24 hours) at 02/26/16 0805 Last data filed at 02/26/16 0600  Gross per 24 hour  Intake          1239.17 ml  Output              426 ml  Net           813.17 ml    Intake/Output this shift: No intake/output data recorded.  Labs:  Recent Labs  02/25/16 0426 02/26/16 0448  HGB 7.8* 8.0*    Recent Labs  02/25/16 0426 02/26/16 0448  WBC 11.2* 11.4*  RBC 2.69* 2.74*  HCT 24.4* 25.1*  PLT 211 222    Recent Labs  02/25/16 0426 02/26/16 0448  NA 139 138  K 4.5 4.4  CL 108 105  CO2 26 27  BUN 17 22*  CREATININE 0.82 1.01*  GLUCOSE 145* 163*  CALCIUM 7.8* 8.1*   No results for input(s): LABPT, INR in the last 72 hours.  EXAM: General - Patient is Alert, Appropriate and Oriented Extremity - Neurovascular intact Sensation intact distally Dorsiflexion/Plantar flexion intact Incision - clean, dry, no drainage Motor Function - intact, moving foot and toes well on exam.   Assessment/Plan: 2 Days Post-Op Procedure(s) (LRB): LEFT TOTAL HIP ARTHROPLASTY ANTERIOR APPROACH (Left) Procedure(s) (LRB): LEFT TOTAL HIP ARTHROPLASTY ANTERIOR APPROACH (Left) Past Medical History:  Diagnosis Date  . Canker sore   . Cystocele   .  Depression   . Diverticulosis   . Esophageal dysmotility   . Esophageal stricture   . GERD (gastroesophageal reflux disease)   . Heartburn   . Hypertension   . TIA (transient ischemic attack)    Principal Problem:   OA (osteoarthritis) of hip  Estimated body mass index is 28.17 kg/m as calculated from the following:   Height as of this encounter: 5\' 2"  (1.575 m).   Weight as of this encounter: 69.9 kg (154 lb). Up with therapy Discharge home with home health  HGB stable and up a little to 8.0. Added iron supplement. Diet - Cardiac diet Follow up - in 2 weeks Activity - WBAT Disposition - Home Condition Upon Discharge - improving D/C Meds - See DC Summary DVT Prophylaxis - Xarelto  Avel Peace, PA-C Orthopaedic Surgery 02/26/2016, 8:05 AM

## 2016-03-04 ENCOUNTER — Ambulatory Visit: Payer: Self-pay | Admitting: Orthopedic Surgery

## 2016-03-15 ENCOUNTER — Ambulatory Visit: Payer: Medicare Other | Attending: Orthopedic Surgery | Admitting: Physical Therapy

## 2016-03-15 ENCOUNTER — Encounter: Payer: Self-pay | Admitting: Physical Therapy

## 2016-03-15 DIAGNOSIS — M25552 Pain in left hip: Secondary | ICD-10-CM | POA: Diagnosis not present

## 2016-03-15 DIAGNOSIS — R262 Difficulty in walking, not elsewhere classified: Secondary | ICD-10-CM

## 2016-03-15 DIAGNOSIS — M6281 Muscle weakness (generalized): Secondary | ICD-10-CM | POA: Diagnosis present

## 2016-03-15 NOTE — Therapy (Signed)
[facility] [address] [place], [place], [address] Phone: [phone]   Fax:  [phone]  Physical Therapy Evaluation  Patient Details  Name: Tina [name] MRN: [id number] Date of Birth: April 19, 1931 Referring Provider: Lequita Halt  Encounter Date: 03/15/2016      PT End of Session - 03/15/16 1549    Visit Number 1   Date for PT Re-Evaluation 05/15/16   PT Start Time 1530   PT Stop Time 1615   PT Time Calculation (min) 45 min   Activity Tolerance Patient tolerated treatment well   Behavior During Therapy [facility] for tasks assessed/performed      Past Medical History:  Diagnosis Date  . Canker sore   . Cystocele   . Depression   . Diverticulosis   . Esophageal dysmotility   . Esophageal stricture   . GERD (gastroesophageal reflux disease)   . Heartburn   . Hypertension   . TIA (transient ischemic attack)     Past Surgical History:  Procedure Laterality Date  . APPENDECTOMY    . CATARACT EXTRACTION    . COLON SURGERY    . JOINT REPLACEMENT    . PARTIAL COLECTOMY  09/1991   due to diverticulitis  . SHOULDER SURGERY     right shoulder- removal calcium deposit  . TOTAL HIP ARTHROPLASTY  6/03  . TOTAL HIP ARTHROPLASTY Left 02/24/2016   Procedure: LEFT TOTAL HIP ARTHROPLASTY ANTERIOR APPROACH;  Surgeon: [name], MD;  Location: [facility];  Service: Orthopedics;  Laterality: Left;    There were no vitals filed for this visit.       Subjective Assessment - 03/15/16 1530    Subjective Patient underwent a left anterior THR February 24, 2016  She had a 3 day hospital stay then home health PT for a few visits.  She had the right hip replaced about 2003   Limitations Lifting;Standing;Walking;House hold activities   Patient Stated Goals have less pain, be more stable walking, reports that she is unable to put shoes and socks on   Currently in Pain? Yes   Pain Score 1    Pain Location Hip   Pain Orientation  Left;Anterior   Pain Descriptors / Indicators Aching;Tightness   Pain Onset 1 to 4 weeks ago   Pain Frequency Intermittent   Aggravating Factors  standing and walking, sitting long periods   Pain Relieving Factors rest, some pain meds   Effect of Pain on Daily Activities difficulty walking and with ADL's            [facility] PT Assessment - 03/15/16 0001      Assessment   Medical Diagnosis s/p left THR anterior approach   Referring Provider Aluisio   Onset Date/Surgical Date 02/24/16   Prior Therapy about 2 months ago to get ready for surgery     Balance Screen   Has the patient fallen in the past 6 months No   Has the patient had a decrease in activity level because of a fear of falling?  No   Is the patient reluctant to leave their home because of a fear of falling?  No     Home Environment   Additional Comments a few steps in the home, does some housework and yardwork     Prior Function   Level of Independence Independent   Vocation Retired   Leisure no exercise     AROM   Left Hip Flexion 60   Left Hip  External Rotation  0   Left Hip Internal Rotation  0   Left Hip ABduction 12     Strength   Overall Strength Comments 3+/5 for the left hip with mild pain     Palpation   Palpation comment has a knot at the proximal and distal area of the scar, there is some puckering of the proximal scar, mild tenderness     Ambulation/Gait   Gait Comments uses a SPC, slow, antalgic on the left     Timed Up and Go Test   Normal TUG (seconds) 30                   [facility] Adult PT Treatment/Exercise - 03/15/16 0001      Knee/Hip Exercises: Aerobic   Nustep Level 4 x 5 minutes     Knee/Hip Exercises: Machines for Strengthening   Cybex Knee Extension 5# 2x10   Total Gym Leg Press 20# x10, no weight 2x10                  PT Short Term Goals - 03/15/16 1552      PT SHORT TERM GOAL #1   Title independent with HEP   Time 1   Period Weeks   Status New            PT Long Term Goals - 03/15/16 1552      PT LONG TERM GOAL #1   Title report 50% easier to get in and out of car, at eval she was using her arms to lift legs into the car   Time 8   Period Weeks   Status New     PT LONG TERM GOAL #2   Title decrease TUG time to 20 seconds   Time 8   Period Weeks   Status New     PT LONG TERM GOAL #3   Title reports pain decreased 25%   Time 8   Period Weeks   Status New     PT LONG TERM GOAL #4   Title increase hip strength to 4-/5   Time 8   Period Weeks   Status New     PT LONG TERM GOAL #5   Title able to do stairs step over step   Time 8   Period Weeks   Status New               Plan - 03/15/16 1550    Clinical Impression Statement Patient with left anterior hip replacement 02/24/16.  She reports that she feels pretty good.  Her Tug time was 30 seconds.  She has decreased ROM and strength of the hips.   Rehab Potential Good   PT Frequency 2x / week   PT Duration 4 weeks   PT Treatment/Interventions ADLs/Self Care Home Management;Electrical Stimulation;Moist Heat;Therapeutic exercise;Therapeutic activities;Functional mobility training;Gait training;Ultrasound;Balance training;Neuromuscular re-education;Patient/family education;Manual techniques;Passive range of motion   PT Next Visit Plan Add exercises and gait to increase function   Consulted and Agree with Plan of Care Patient      Patient will benefit from skilled therapeutic intervention in order to improve the following deficits and impairments:  Abnormal gait, Decreased activity tolerance, Decreased balance, Decreased mobility, Decreased range of motion, Difficulty walking, Decreased strength, Impaired flexibility, Pain  Visit Diagnosis: Pain in left hip - Plan: PT plan of care cert/re-cert  Difficulty in walking, not elsewhere classified - Plan: PT plan of care cert/re-cert  Muscle weakness (generalized) - Plan: PT plan  of care cert/re-cert       G-Codes - 03/15/16 1558    Functional Assessment Tool Used foto 70% limitation   Functional Limitation Mobility: Walking and moving around   Mobility: Walking and Moving Around Current Status [phone]([id number]) At least 60 percent but less than 80 percent impaired, limited or restricted   Mobility: Walking and Moving Around Goal Status [phone]([id number]) At least 40 percent but less than 60 percent impaired, limited or restricted       Problem List Patient Active Problem List   Diagnosis Date Noted  . OA (osteoarthritis) of hip 02/24/2016    Jearld LeschALBRIGHT,Kristine Chahal W., PT 03/15/2016, 4:07 PM  [facility] [address] [place], [place], [address] Phone: [phone]   Fax:  [phone]  Name: Tina [name] MRN: [id number] Date of Birth: 07/03/1931

## 2016-03-17 ENCOUNTER — Ambulatory Visit: Payer: Medicare Other | Admitting: Physical Therapy

## 2016-03-17 DIAGNOSIS — R262 Difficulty in walking, not elsewhere classified: Secondary | ICD-10-CM

## 2016-03-17 DIAGNOSIS — M6281 Muscle weakness (generalized): Secondary | ICD-10-CM

## 2016-03-17 DIAGNOSIS — M25552 Pain in left hip: Secondary | ICD-10-CM | POA: Diagnosis not present

## 2016-03-17 NOTE — Therapy (Signed)
[facility] [address] [place], [place], [address] Phone: [phone]   Fax:  [phone]  Physical Therapy Treatment  Patient Details  Name: Tina ConferLouise T [name] MRN: [id number] Date of Birth: 01/31/1931 Referring Provider: Lequita HaltAluisio  Encounter Date: 03/17/2016      PT End of Session - 03/17/16 1529    Visit Number 2   Date for PT Re-Evaluation 05/15/16   PT Start Time 1445   PT Stop Time 1540   PT Time Calculation (min) 55 min      Past Medical History:  Diagnosis Date  . Canker sore   . Cystocele   . Depression   . Diverticulosis   . Esophageal dysmotility   . Esophageal stricture   . GERD (gastroesophageal reflux disease)   . Heartburn   . Hypertension   . TIA (transient ischemic attack)     Past Surgical History:  Procedure Laterality Date  . APPENDECTOMY    . CATARACT EXTRACTION    . COLON SURGERY    . JOINT REPLACEMENT    . PARTIAL COLECTOMY  09/1991   due to diverticulitis  . SHOULDER SURGERY     right shoulder- removal calcium deposit  . TOTAL HIP ARTHROPLASTY  6/03  . TOTAL HIP ARTHROPLASTY Left 02/24/2016   Procedure: LEFT TOTAL HIP ARTHROPLASTY ANTERIOR APPROACH;  Surgeon: Ollen GrossFrank Aluisio, MD;  [facility];  Service: Orthopedics;  Laterality: Left;    There were no vitals filed for this visit.      Subjective Assessment - 03/17/16 1445    Subjective "Not really any pain today, just some stiffness in the hip."   Limitations Lifting;Standing;Walking;House hold activities   Patient Stated Goals have less pain, be more stable walking, reports that she is unable to put shoes and socks on   Currently in Pain? No/denies                         [facility] Adult PT Treatment/Exercise - 03/17/16 0001      Knee/Hip Exercises: Aerobic   Nustep level 4 x 6 minutes     Knee/Hip Exercises: Machines for Strengthening   Cybex Knee Extension 5# 10 reps   Total Gym Leg Press 20# x10,  no weight x10     Knee/Hip Exercises: Standing   Hip Flexion Both;2 sets;10 reps   Hip Abduction Both;10 reps   Hip Extension Both;10 reps     Knee/Hip Exercises: Seated   Ball Squeeze 20 reps   Clamshell with TheraBand --   Other Seated Knee/Hip Exercises IR of hips with 2lb weight x10 ea    Marching Limitations 2x20   Marching Weights 2 lbs.   Hamstring Curl 20 reps;Both;Strengthening   Hamstring Limitations Green tband   Abduction/Adduction  2 sets;10 reps   Abd/Adduction Limitations Green tband   Sit to [employer] without UE support     Moist Heat Therapy   Number Minutes Moist Heat 15 Minutes   Moist Heat Location Hip                  PT Short Term Goals - 03/15/16 1552      PT SHORT TERM GOAL #1   Title independent with HEP   Time 1   Period Weeks   Status New           PT Long Term Goals - 03/15/16 1552      PT LONG TERM GOAL #1  Title report 50% easier to get in and out of car, at eval she was using her arms to lift legs into the car   Time 8   Period Weeks   Status New     PT LONG TERM GOAL #2   Title decrease TUG time to 20 seconds   Time 8   Period Weeks   Status New     PT LONG TERM GOAL #3   Title reports pain decreased 25%   Time 8   Period Weeks   Status New     PT LONG TERM GOAL #4   Title increase hip strength to 4-/5   Time 8   Period Weeks   Status New     PT LONG TERM GOAL #5   Title able to do stairs step over step   Time 8   Period Weeks   Status New               Plan - 03/17/16 1530    Clinical Impression Statement Pt tolerated added exercises but got very fatigued during standing hip exercise, adjusted reps to not overexacerbate. Pt had decreased ROM in L hip throughout ther ex. Pt required verbal cues and SPTA assist to avoid posterior trunk lean during seated marches.    Rehab Potential Good   PT Frequency 2x / week   PT Duration 4 weeks   PT Treatment/Interventions ADLs/Self Care Home  Management;Electrical Stimulation;Moist Heat;Therapeutic exercise;Therapeutic activities;Functional mobility training;Gait training;Ultrasound;Balance training;Neuromuscular re-education;Patient/family education;Manual techniques;Passive range of motion   PT Next Visit Plan Continue with strengthening exercises and implement gait training to foster improved ROM and function.       Patient will benefit from skilled therapeutic intervention in order to improve the following deficits and impairments:  Abnormal gait, Decreased activity tolerance, Decreased balance, Decreased mobility, Decreased range of motion, Difficulty walking, Decreased strength, Impaired flexibility, Pain  Visit Diagnosis: Pain in left hip  Difficulty in walking, not elsewhere classified  Muscle weakness (generalized)     Problem List Patient Active Problem List   Diagnosis Date Noted  . OA (osteoarthritis) of hip 02/24/2016    Justus MemoryBrianna Dalena Plantz, SPTA 03/17/2016, 3:35 PM  [facility] [address] [place], [place], [address] Phone: [phone]   Fax:  [phone]  Name: Tina ConferLouise T [name] MRN: [id number] Date of Birth: 10/31/1930

## 2016-03-21 ENCOUNTER — Encounter: Payer: Self-pay | Admitting: Physical Therapy

## 2016-03-21 ENCOUNTER — Ambulatory Visit: Payer: Medicare Other | Admitting: Physical Therapy

## 2016-03-21 DIAGNOSIS — R262 Difficulty in walking, not elsewhere classified: Secondary | ICD-10-CM

## 2016-03-21 DIAGNOSIS — M25552 Pain in left hip: Secondary | ICD-10-CM | POA: Diagnosis not present

## 2016-03-21 NOTE — Therapy (Signed)
[facility] [address] [place], [place], [address] Phone: [phone]   Fax:  [phone]  Physical Therapy Treatment  Patient Details  Name: Tina [name] MRN: [id number] Date of Birth: 05/28/1931 Referring Provider: Lequita HaltAluisio  Encounter Date: 03/21/2016      PT End of Session - 03/21/16 1429    Visit Number 3   Date for PT Re-Evaluation 05/15/16   PT Start Time 1353   PT Stop Time 1441   PT Time Calculation (min) 48 min   Activity Tolerance Patient tolerated treatment well   Behavior During Therapy [facility] for tasks assessed/performed      Past Medical History:  Diagnosis Date  . Canker sore   . Cystocele   . Depression   . Diverticulosis   . Esophageal dysmotility   . Esophageal stricture   . GERD (gastroesophageal reflux disease)   . Heartburn   . Hypertension   . TIA (transient ischemic attack)     Past Surgical History:  Procedure Laterality Date  . APPENDECTOMY    . CATARACT EXTRACTION    . COLON SURGERY    . JOINT REPLACEMENT    . PARTIAL COLECTOMY  09/1991   due to diverticulitis  . SHOULDER SURGERY     right shoulder- removal calcium deposit  . TOTAL HIP ARTHROPLASTY  6/03  . TOTAL HIP ARTHROPLASTY Left 02/24/2016   Procedure: LEFT TOTAL HIP ARTHROPLASTY ANTERIOR APPROACH;  Surgeon: Ollen GrossFrank Aluisio, MD;  [facility];  Service: Orthopedics;  Laterality: Left;    There were no vitals filed for this visit.      Subjective Assessment - 03/21/16 1403    Subjective Just stiff, want to walk without the cane   Currently in Pain? No/denies                         [facility] Treatment/Exercise - 03/21/16 0001      Ambulation/Gait   Gait Comments no cane 150 feet x 2     Knee/Hip Exercises: Aerobic   Nustep level 4 x 6 minutes     Knee/Hip Exercises: Machines for Strengthening   Cybex Knee Extension 5# 10 reps   Cybex Knee Flexion 20# 2x10   Total Gym Leg Press  20# x10, no weight x10     Knee/Hip Exercises: Standing   Hip Flexion Both;2 sets;10 reps   Hip Flexion Limitations 2#   Hip Abduction Both;10 reps   Abduction Limitations 2#   Hip Extension Both;10 reps   Extension Limitations 2#                  PT Short Term Goals - 03/15/16 1552      PT SHORT TERM GOAL #1   Title independent with HEP   Time 1   Period Weeks   Status New           PT Long Term Goals - 03/15/16 1552      PT LONG TERM GOAL #1   Title report 50% easier to get in and out of car, at eval she was using her arms to lift legs into the car   Time 8   Period Weeks   Status New     PT LONG TERM GOAL #2   Title decrease TUG time to 20 seconds   Time 8   Period Weeks   Status New     PT LONG TERM GOAL #  3   Title reports pain decreased 25%   Time 8   Period Weeks   Status New     PT LONG TERM GOAL #4   Title increase hip strength to 4-/5   Time 8   Period Weeks   Status New     PT LONG TERM GOAL #5   Title able to do stairs step over step   Time 8   Period Weeks   Status New               Plan - 03/21/16 1430    Clinical Impression Statement Doing better, able to walk with less trunk lean without SPC, her first steps without cane are with increase of trunk lean and reports not feeling safe.   PT Next Visit Plan Continue with strengthening exercises and implement gait training to foster improved ROM and function.    Consulted and Agree with Plan of Care Patient      Patient will benefit from skilled therapeutic intervention in order to improve the following deficits and impairments:  Abnormal gait, Decreased activity tolerance, Decreased balance, Decreased mobility, Decreased range of motion, Difficulty walking, Decreased strength, Impaired flexibility, Pain  Visit Diagnosis: Pain in left hip  Difficulty in walking, not elsewhere classified     Problem List Patient Active Problem List   Diagnosis Date Noted  . OA  (osteoarthritis) of hip 02/24/2016    Jearld LeschALBRIGHT,Ayleah Hofmeister W., PT 03/21/2016, 2:37 PM  [facility] [address] [place], [place], [address] Phone: [phone]   Fax:  [phone]  Name: Tina [name] MRN: [id number] Date of Birth: 09/12/1930

## 2016-03-23 ENCOUNTER — Encounter: Payer: Self-pay | Admitting: Physical Therapy

## 2016-03-23 ENCOUNTER — Ambulatory Visit: Payer: Medicare Other | Admitting: Physical Therapy

## 2016-03-23 DIAGNOSIS — M6281 Muscle weakness (generalized): Secondary | ICD-10-CM

## 2016-03-23 DIAGNOSIS — M25552 Pain in left hip: Secondary | ICD-10-CM

## 2016-03-23 DIAGNOSIS — R262 Difficulty in walking, not elsewhere classified: Secondary | ICD-10-CM

## 2016-03-23 NOTE — Therapy (Addendum)
[place], [place], [address] Phone: [phone]   Fax:  [phone]  Physical Therapy Treatment  Patient Details  Name: Tina [name] MRN: [id number] Date of Birth: 1930/10/11 Referring Provider: Wynelle Link  Encounter Date: 03/23/2016      PT End of Session - 03/23/16 1639    Visit Number 4   Date for PT Re-Evaluation 05/15/16   PT Start Time 1608   PT Stop Time 1657   PT Time Calculation (min) 49 min   Activity Tolerance Patient tolerated treatment well   Behavior During Therapy [facility] for tasks assessed/performed      Past Medical History:  Diagnosis Date  . Canker sore   . Cystocele   . Depression   . Diverticulosis   . Esophageal dysmotility   . Esophageal stricture   . GERD (gastroesophageal reflux disease)   . Heartburn   . Hypertension   . TIA (transient ischemic attack)     Past Surgical History:  Procedure Laterality Date  . APPENDECTOMY    . CATARACT EXTRACTION    . COLON SURGERY    . JOINT REPLACEMENT    . PARTIAL COLECTOMY  09/1991   due to diverticulitis  . SHOULDER SURGERY     right shoulder- removal calcium deposit  . TOTAL HIP ARTHROPLASTY  6/03  . TOTAL HIP ARTHROPLASTY Left 02/24/2016   Procedure: LEFT TOTAL HIP ARTHROPLASTY ANTERIOR APPROACH;  Surgeon: Gaynelle Arabian, MD;  [facility];  Service: Orthopedics;  Laterality: Left;    There were no vitals filed for this visit.      Subjective Assessment - 03/23/16 1617    Subjective I was really sore after that last treatment   Currently in Pain? Yes   Pain Score 5    Pain Location Hip   Pain Orientation Left;Anterior;Posterior   Pain Descriptors / Indicators Aching;Sore   Aggravating Factors  the last treatment really made me sore                         [facility] Adult PT Treatment/Exercise - 03/23/16 0001      Knee/Hip Exercises: Aerobic   Nustep level 3 x 6 minutes     Knee/Hip  Exercises: Seated   Long Arc Quad 20 reps   Long Arc Quad Weight 2 lbs.   Ball Squeeze 20 reps   Marching Limitations 2x20   Marching Weights 2 lbs.     Knee/Hip Exercises: Supine   Other Supine Knee/Hip Exercises feet on ball K2C and bridges     Moist Heat Therapy   Number Minutes Moist Heat 15 Minutes   Moist Heat Location Hip     Electrical Stimulation   Electrical Stimulation Location left hip   Electrical Stimulation Action IFC   Electrical Stimulation Parameters supine   Electrical Stimulation Goals Pain                  PT Short Term Goals - 03/23/16 1640      PT SHORT TERM GOAL #1   Title independent with HEP   Status Achieved           PT Long Term Goals - 03/15/16 1552      PT LONG TERM GOAL #1   Title report 50% easier to get in and out of car, at eval she was using her arms to lift legs into the car   Time 8  Period Weeks   Status New     PT LONG TERM GOAL #2   Title decrease TUG time to 20 seconds   Time 8   Period Weeks   Status New     PT LONG TERM GOAL #3   Title reports pain decreased 25%   Time 8   Period Weeks   Status New     PT LONG TERM GOAL #4   Title increase hip strength to 4-/5   Time 8   Period Weeks   Status New     PT LONG TERM GOAL #5   Title able to do stairs step over step   Time 8   Period Weeks   Status New               Plan - 03/23/16 1639    Clinical Impression Statement Very sore after last treatment, backed down from some of the exercises today.   PT Next Visit Plan monitor the soreness and adjust exercises as tolerated   Consulted and Agree with Plan of Care Patient      Patient will benefit from skilled therapeutic intervention in order to improve the following deficits and impairments:  Abnormal gait, Decreased activity tolerance, Decreased balance, Decreased mobility, Decreased range of motion, Difficulty walking, Decreased strength, Impaired flexibility, Pain  Visit  Diagnosis: Pain in left hip  Difficulty in walking, not elsewhere classified  Muscle weakness (generalized)     Problem List Patient Active Problem List   Diagnosis Date Noted  . OA (osteoarthritis) of hip 02/24/2016  PHYSICAL THERAPY DISCHARGE SUMMARY   Plan: Patient agrees to discharge.  Patient goals were met. Patient is being discharged due to meeting the stated rehab goals.  ?????       Sumner Boast., PT 03/23/2016, 4:41 PM  [address] [place], [place], [address] Phone: [phone]   Fax:  [phone]  Name: Tina [name] MRN: [id number] Date of Birth: [birth year]

## 2016-03-29 ENCOUNTER — Ambulatory Visit: Payer: Medicare Other | Admitting: Physical Therapy

## 2016-11-03 ENCOUNTER — Encounter (HOSPITAL_COMMUNITY): Payer: Self-pay | Admitting: Emergency Medicine

## 2016-11-03 ENCOUNTER — Emergency Department (HOSPITAL_COMMUNITY)
Admission: EM | Admit: 2016-11-03 | Discharge: 2016-11-03 | Disposition: A | Discharge status: Home / Self Care | Payer: Medicare Other | Attending: Emergency Medicine | Admitting: Emergency Medicine

## 2016-11-03 ENCOUNTER — Emergency Department (HOSPITAL_COMMUNITY): Payer: Medicare Other

## 2016-11-03 DIAGNOSIS — Z8673 Personal history of transient ischemic attack (TIA), and cerebral infarction without residual deficits: Secondary | ICD-10-CM | POA: Diagnosis not present

## 2016-11-03 DIAGNOSIS — M25561 Pain in right knee: Secondary | ICD-10-CM

## 2016-11-03 DIAGNOSIS — M25551 Pain in right hip: Secondary | ICD-10-CM

## 2016-11-03 DIAGNOSIS — Y939 Activity, unspecified: Secondary | ICD-10-CM | POA: Diagnosis not present

## 2016-11-03 DIAGNOSIS — I10 Essential (primary) hypertension: Secondary | ICD-10-CM | POA: Insufficient documentation

## 2016-11-03 DIAGNOSIS — W1830XA Fall on same level, unspecified, initial encounter: Secondary | ICD-10-CM | POA: Insufficient documentation

## 2016-11-03 DIAGNOSIS — Z87891 Personal history of nicotine dependence: Secondary | ICD-10-CM | POA: Insufficient documentation

## 2016-11-03 DIAGNOSIS — Y999 Unspecified external cause status: Secondary | ICD-10-CM | POA: Diagnosis not present

## 2016-11-03 DIAGNOSIS — Y92009 Unspecified place in unspecified non-institutional (private) residence as the place of occurrence of the external cause: Secondary | ICD-10-CM | POA: Diagnosis not present

## 2016-11-03 DIAGNOSIS — Z96642 Presence of left artificial hip joint: Secondary | ICD-10-CM | POA: Diagnosis not present

## 2016-11-03 DIAGNOSIS — W19XXXA Unspecified fall, initial encounter: Secondary | ICD-10-CM

## 2016-11-03 LAB — COMPREHENSIVE METABOLIC PANEL
ALT: 11 U/L — AB (ref 14–54)
AST: 16 U/L (ref 15–41)
Albumin: 4.1 g/dL (ref 3.5–5.0)
Alkaline Phosphatase: 70 U/L (ref 38–126)
Anion gap: 6 (ref 5–15)
BUN: 21 mg/dL — ABNORMAL HIGH (ref 6–20)
CHLORIDE: 106 mmol/L (ref 101–111)
CO2: 28 mmol/L (ref 22–32)
CREATININE: 0.81 mg/dL (ref 0.44–1.00)
Calcium: 9.1 mg/dL (ref 8.9–10.3)
Glucose, Bld: 91 mg/dL (ref 65–99)
Potassium: 4.7 mmol/L (ref 3.5–5.1)
Sodium: 140 mmol/L (ref 135–145)
Total Bilirubin: 0.4 mg/dL (ref 0.3–1.2)
Total Protein: 6.7 g/dL (ref 6.5–8.1)

## 2016-11-03 LAB — I-STAT TROPONIN, ED: Troponin i, poc: 0 ng/mL (ref 0.00–0.08)

## 2016-11-03 LAB — CBC WITH DIFFERENTIAL/PLATELET
BASOS PCT: 0 %
Basophils Absolute: 0 10*3/uL (ref 0.0–0.1)
EOS ABS: 0.1 10*3/uL (ref 0.0–0.7)
EOS PCT: 1 %
HEMATOCRIT: 33.7 % — AB (ref 36.0–46.0)
Hemoglobin: 11.2 g/dL — ABNORMAL LOW (ref 12.0–15.0)
Lymphocytes Relative: 32 %
Lymphs Abs: 2.9 10*3/uL (ref 0.7–4.0)
MCH: 29.1 pg (ref 26.0–34.0)
MCHC: 33.2 g/dL (ref 30.0–36.0)
MCV: 87.5 fL (ref 78.0–100.0)
MONO ABS: 0.8 10*3/uL (ref 0.1–1.0)
MONOS PCT: 9 %
NEUTROS ABS: 5.1 10*3/uL (ref 1.7–7.7)
Neutrophils Relative %: 58 %
Platelets: 230 10*3/uL (ref 150–400)
RBC: 3.85 MIL/uL — ABNORMAL LOW (ref 3.87–5.11)
RDW: 13.8 % (ref 11.5–15.5)
WBC: 8.9 10*3/uL (ref 4.0–10.5)

## 2016-11-03 MED ORDER — DOCUSATE SODIUM 100 MG PO CAPS
100.0000 mg | ORAL_CAPSULE | Freq: Two times a day (BID) | ORAL | 0 refills | Status: DC
Start: 2016-11-03 — End: 2019-11-25

## 2016-11-03 MED ORDER — HYDROCODONE-ACETAMINOPHEN 5-325 MG PO TABS
1.0000 | ORAL_TABLET | Freq: Once | ORAL | Status: AC
  Administered 2016-11-03: 1 via ORAL
  Filled 2016-11-03: qty 1

## 2016-11-03 MED ORDER — HYDROCODONE-ACETAMINOPHEN 5-325 MG PO TABS: 1.0000 | ORAL_TABLET | ORAL | 0 refills | Status: DC | PRN

## 2016-11-03 NOTE — ED Notes (Signed)
Patient is A & O x4.  She understood AVS instructions.  No questions or concerns.  

## 2016-11-03 NOTE — ED Triage Notes (Signed)
Pt reports fall yesterday, landing on right side. C/o right hip pain down to the knee. Pt reports using walker at home since fall. No obvious deformity. Minor swelling noted to right knee.

## 2016-11-03 NOTE — Discharge Instructions (Signed)
We believe that your symptoms are caused by musculoskeletal strain.  Please read through the included information about additional care such as heating pads, over-the-counter pain medicine.  If you were provided a prescription please use it only as needed and as instructed.  Remember that early mobility and using the affected part of your body is actually better than keeping it immobile.  I did prescribe a pain medication at discharge. Do not take this with your Hydrocodone cough medication because these are the same and can cause an unintentional overdose.   Follow-up with the doctor listed as recommended or return to the emergency department with new or worsening symptoms that concern you.

## 2016-11-03 NOTE — ED Provider Notes (Signed)
Emergency Department Provider Note   I have reviewed the triage vital signs and the nursing notes.   HISTORY  Chief Complaint Fall   HPI Tina [name] is a 83 y.o. female with PMH of GERD, HTN, TIA, s/p bilateral hip replacement and depression presents to the emergency department for evaluation after a fall yesterday. Patient states she was walking in her living room when she suddenly lost her balance and began to fall. She reports a twisting motion to the right where she caught herself on a chair. Denies any head trauma or loss of consciousness. She is able to get back on her feet but throughout the evening and night time she had worsening pain in the right leg.    Past Medical History:  Diagnosis Date  . Canker sore   . Cystocele   . Depression   . Diverticulosis   . Esophageal dysmotility   . Esophageal stricture   . GERD (gastroesophageal reflux disease)   . Heartburn   . Hypertension   . TIA (transient ischemic attack)     Patient Active Problem List   Diagnosis Date Noted  . OA (osteoarthritis) of hip 02/24/2016    Past Surgical History:  Procedure Laterality Date  . APPENDECTOMY    . CATARACT EXTRACTION    . COLON SURGERY    . JOINT REPLACEMENT    . PARTIAL COLECTOMY  09/1991   due to diverticulitis  . SHOULDER SURGERY     right shoulder- removal calcium deposit  . TOTAL HIP ARTHROPLASTY  6/03  . TOTAL HIP ARTHROPLASTY Left 02/24/2016   Procedure: LEFT TOTAL HIP ARTHROPLASTY ANTERIOR APPROACH;  Surgeon: [name], MD;  Location: [facility];  Service: Orthopedics;  Laterality: Left;    Current Outpatient Rx  . Order #: [id number] Class: Historical Med  . Order #: [id number] Class: Historical Med  . Order #: [id number] Class: Historical Med  . Order #: [id number] Class: Historical Med  . Order #: [id number] Class: Historical Med  . Order #: [id number] Class: Historical Med  . Order #: [id number] Class: Historical Med  . Order #: [id number] Class: Historical Med  .  Order #: [id number] Class: Historical Med  . Order #: [id number] Class: Historical Med  . Order #: [id number] Class: Historical Med  . Order #: [id number] Class: Historical Med  . Order #: [id number] Class: Print  . Order #: [id number] Class: Print    Allergies Valdecoxib and Prednisone  Family History  Problem Relation Age of Onset  . Cancer Paternal Aunt   . Cancer Paternal Uncle   . Diabetes Father   . Heart disease Father   . Cancer Father   . Diabetes Sister   . Heart disease Sister   . Leukemia Sister   . Heart disease Brother     Social History Social History  Substance Use Topics  . Smoking status: Former Smoker    Quit date: 08/02/1963  . Smokeless tobacco: Never Used  . Alcohol use No    Review of Systems  Constitutional: No fever/chills Eyes: No visual changes. ENT: No sore throat. Cardiovascular: Denies chest pain. Respiratory: Denies shortness of breath. Gastrointestinal: No abdominal pain.  No nausea, no vomiting.  No diarrhea.  No constipation. Genitourinary: Negative for dysuria. Musculoskeletal: Negative for back pain. Positive right leg pain.  Skin: Negative for rash. Neurological: Negative for headaches, focal weakness or numbness.  10-point ROS otherwise negative.  ____________________________________________   PHYSICAL EXAM:  VITAL SIGNS: ED Triage Vitals  Enc Vitals Group     BP 11/03/16 1623 (!) 165/62  Pulse Rate 11/03/16 1623 71     Resp 11/03/16 1623 14     Temp 11/03/16 1623 98 F (36.7 C)     Temp Source 11/03/16 1623 Oral     SpO2 11/03/16 1623 100 %     Weight 11/03/16 1625 154 lb (69.9 kg)     Height 11/03/16 1625  (1.626 m)     Pain Score 11/03/16 1626 8   Constitutional: Alert and oriented. Well appearing and in no acute distress. Eyes: Conjunctivae are normal. Head: Atraumatic. Nose: No congestion/rhinnorhea. Mouth/Throat: Mucous membranes are moist.  Neck: No stridor. No cervical spine tenderness.  Cardiovascular:  Normal rate, regular rhythm. Good peripheral circulation. Grossly normal heart sounds.   Respiratory: Normal respiratory effort.  No retractions. Lungs CTAB. Gastrointestinal: Soft and nontender. No distention.  Musculoskeletal: No lower extremity edema. Mild left knee pain to palpation. Normal ROM of the right hip. No gross deformities of extremities.  Neurologic:  Normal speech and language. No gross focal neurologic deficits are appreciated.  Skin:  Skin is warm, dry and intact. No rash noted.  ____________________________________________   LABS (all labs ordered are listed, but only abnormal results are displayed)  Labs Reviewed  CBC WITH DIFFERENTIAL/PLATELET - Abnormal; Notable for the following:       Result Value   RBC 3.85 (*)    Hemoglobin 11.2 (*)    HCT 33.7 (*)    All other components within normal limits  COMPREHENSIVE METABOLIC PANEL - Abnormal; Notable for the following:    BUN 21 (*)    ALT 11 (*)    All other components within normal limits  I-STAT TROPOININ, ED   ____________________________________________  EKG   EKG Interpretation  Date/Time:  Thursday November 03 2016 17:51:24 EDT Ventricular Rate:  67 PR Interval:    QRS Duration: 98 QT Interval:  434 QTC Calculation: 459 R Axis:   64 Text Interpretation:  Sinus rhythm Confirmed by Donnald Garre, MD, Lebron Conners [phone]) on 11/03/2016 5:58:44 PM Also confirmed by Donnald Garre, MD, Lebron Conners [phone]), editor [place]  CCT, [place] ([id number])  on 11/04/2016 7:09:06 AM       ____________________________________________  RADIOLOGY  Dg Chest 2 View  Result Date: 11/03/2016 CLINICAL DATA:  Fall with right hip pain, history of hypertension EXAM: CHEST  2 VIEW COMPARISON:  12/19/2012 FINDINGS: The heart size and mediastinal contours are within normal limits. Aortic atherosclerosis. Both lungs are clear. The visualized skeletal structures are unremarkable. IMPRESSION: No active cardiopulmonary disease. Electronically Signed   By: Jasmine Pang M.D.   On: 11/03/2016 17:47   Dg Knee 2 Views Right  Result Date: 11/03/2016 CLINICAL DATA:  Fall with pain in the knee EXAM: RIGHT KNEE - 1-2 VIEW COMPARISON:  None. FINDINGS: Vascular calcifications in the soft tissues. No acute displaced fracture or malalignment. Small suprapatellar joint effusion. IMPRESSION: 1. No definite acute osseous abnormality 2. Small joint effusion Electronically Signed   By: Jasmine Pang M.D.   On: 11/03/2016 17:46   Dg Hip Unilat W Or Wo Pelvis 2-3 Views Right  Result Date: 11/03/2016 CLINICAL DATA:  Fall yesterday with pain in the right hip EXAM: DG HIP (WITH OR WITHOUT PELVIS) 2-3V RIGHT COMPARISON:  02/24/2016 FINDINGS: Status post left hip replacement with normal alignment. SI joint disease without asymmetric widening. Calcified pelvic phleboliths. Pubic symphysis appears intact. Status post right hip replacement with grossly intact hardware and no dislocation. No acute displaced fracture is visualized. Vascular calcifications IMPRESSION: Status post bilateral hip replacements without  acute dislocation or definitive fracture. Electronically Signed   By: Jasmine Pang M.D.   On: 11/03/2016 17:44    ____________________________________________   PROCEDURES  Procedure(s) performed:   Procedures  None ____________________________________________   INITIAL IMPRESSION / ASSESSMENT AND PLAN / ED COURSE  Pertinent labs & imaging results that were available during my care of the patient were reviewed by me and considered in my medical decision making (see chart for details).  Patient presents to the ED with apparent mechanical fall at home yesterday with continued right knee/hip pain. Also with some discomfort in the shoulders. Full ROM of the shoulders. Plan for labs along with plain films of the chest, right knee, and right hip.   Plain film negative. Will discharge home with Vicodin for severe pain only. Patient is taking a cough medication with  hydrocodone. Advised that she not take these both so patient will take one or the other for pain. Discussed PCP follow up.   At this time, I do not feel there is any life-threatening condition present. I have reviewed and discussed all results (EKG, imaging, lab, urine as appropriate), exam findings with patient. I have reviewed nursing notes and appropriate previous records.  I feel the patient is safe to be discharged home without further emergent workup. Discussed usual and customary return precautions. Patient and family (if present) verbalize understanding and are comfortable with this plan.  Patient will follow-up with their primary care provider. If they do not have a primary care provider, information for follow-up has been provided to them. All questions have been answered.  ____________________________________________  FINAL CLINICAL IMPRESSION(S) / ED DIAGNOSES  Final diagnoses:  Fall, initial encounter  Acute pain of right knee  Right hip pain     MEDICATIONS GIVEN DURING THIS VISIT:  Medications  HYDROcodone-acetaminophen (NORCO/VICODIN) 5-325 MG per tablet 1 tablet (1 tablet Oral Given 11/03/16 1734)     NEW OUTPATIENT MEDICATIONS STARTED DURING THIS VISIT:  Discharge Medication List as of 11/03/2016  6:39 PM    START taking these medications   Details  docusate sodium (COLACE) 100 MG capsule Take 1 capsule (100 mg total) by mouth every 12 (twelve) hours., Starting Thu 11/03/2016, Print    HYDROcodone-acetaminophen (NORCO/VICODIN) 5-325 MG tablet Take 1 tablet by mouth every 4 (four) hours as needed., Starting Thu 11/03/2016, Print          Note:  This document was prepared using Dragon voice recognition software and may include unintentional dictation errors.  Alona Bene, MD Emergency Medicine   Maia Plan, MD 11/04/16 [phone]

## 2017-01-17 ENCOUNTER — Ambulatory Visit: Payer: Medicare Other | Admitting: Physical Therapy

## 2017-01-24 ENCOUNTER — Ambulatory Visit: Payer: Medicare Other | Attending: Orthopedic Surgery | Admitting: Physical Therapy

## 2017-01-24 ENCOUNTER — Encounter: Payer: Self-pay | Admitting: Physical Therapy

## 2017-01-24 DIAGNOSIS — R262 Difficulty in walking, not elsewhere classified: Secondary | ICD-10-CM | POA: Diagnosis present

## 2017-01-24 DIAGNOSIS — M25561 Pain in right knee: Secondary | ICD-10-CM | POA: Insufficient documentation

## 2017-01-24 DIAGNOSIS — R2241 Localized swelling, mass and lump, right lower limb: Secondary | ICD-10-CM | POA: Diagnosis present

## 2017-01-24 DIAGNOSIS — M25661 Stiffness of right knee, not elsewhere classified: Secondary | ICD-10-CM

## 2017-01-24 NOTE — Therapy (Signed)
[facility] [place] [address] [place], [place], [address] Phone: [phone]   Fax:  [phone]  Physical Therapy Evaluation  Patient Details  Name: Tina [name] MRN: [id number] Date of Birth: 12-Jul-1931 Referring Provider: Lequita Halt  Encounter Date: 01/24/2017      PT End of Session - 01/24/17 1437    Visit Number 1   Date for PT Re-Evaluation 03/26/17   PT Start Time 1404   PT Stop Time 1450   PT Time Calculation (min) 46 min   Activity Tolerance Patient tolerated treatment well   Behavior During Therapy [facility] for tasks assessed/performed      Past Medical History:  Diagnosis Date  . Canker sore   . Cystocele   . Depression   . Diverticulosis   . Esophageal dysmotility   . Esophageal stricture   . GERD (gastroesophageal reflux disease)   . Heartburn   . Hypertension   . TIA (transient ischemic attack)     Past Surgical History:  Procedure Laterality Date  . APPENDECTOMY    . CATARACT EXTRACTION    . COLON SURGERY    . JOINT REPLACEMENT    . PARTIAL COLECTOMY  09/1991   due to diverticulitis  . SHOULDER SURGERY     right shoulder- removal calcium deposit  . TOTAL HIP ARTHROPLASTY  6/03  . TOTAL HIP ARTHROPLASTY Left 02/24/2016   Procedure: LEFT TOTAL HIP ARTHROPLASTY ANTERIOR APPROACH;  Surgeon: [name], MD;  Location: [facility];  Service: Orthopedics;  Laterality: Left;    There were no vitals filed for this visit.       Subjective Assessment - 01/24/17 1408    Subjective Patient reports that she fell about 3 months ago.  She reports that on 01/10/17, she underwent a right knee scope with debridement.  Patient reports that she has been cold and not feeling well in general since the surgery.  Reports multiple falls over the past 6 months and describes a vertigo type symptom   Limitations Standing;Walking;House hold activities   Patient Stated Goals have less pain, walk better   Currently in Pain?  Yes   Pain Score 2    Pain Location Knee   Pain Orientation Right   Pain Descriptors / Indicators Aching;Sore   Pain Type Surgical pain   Pain Onset 1 to 4 weeks ago   Pain Frequency Constant   Aggravating Factors  walking, bending the pain is up to 8/10   Pain Relieving Factors rest, staying off of the knee  pain at best a 2/10            [facility] PT Assessment - 01/24/17 0001      Assessment   Medical Diagnosis s/p right knee scope   Referring Provider Aluisio   Onset Date/Surgical Date 01/10/17   Prior Therapy yes about a year ago     Precautions   Precautions Fall     Balance Screen   Has the patient fallen in the past 6 months Yes   How many times? 3   Has the patient had a decrease in activity level because of a fear of falling?  Yes   Is the patient reluctant to leave their home because of a fear of falling?  No     Home Environment   Additional Comments a few steps in the home, does some housework and yardwork, lives with husband     Prior Function   Level of Independence Independent  with community mobility with device   Vocation Retired   Leisure reports over the past year has really decreased her activtiy     Posture/Postural Control   Posture Comments fwd head, rounded shoulders     ROM / Strength   AROM / PROM / Strength AROM;PROM;Strength     AROM   Overall AROM Comments at edge of bed   AROM Assessment Site Knee   Right/Left Knee Right   Right Knee Extension 6   Right Knee Flexion 100     PROM   PROM Assessment Site Knee   Right/Left Knee Right   Right Knee Extension 2  very painful into extension   Right Knee Flexion 105     Strength   Overall Strength Comments 4/5 extension, 3+/5 for flexion     Palpation   Palpation comment mild warmth and swelling around the knee, she is tender medially and laterally to the knee     Ambulation/Gait   Gait Comments very slow to get up and with turns as she fears LOB, she has a difficulkt first step,  very slow, antalgic gait on the right, uses the cane in the right hand which is her surgical side.     Standardized Balance Assessment   Standardized Balance Assessment Timed Up and Go Test     Timed Up and Go Test   Normal TUG (seconds) 40            Objective measurements completed on examination: See above findings.          [facility] Treatment/Exercise - 01/24/17 0001      Exercises   Exercises Knee/Hip     Knee/Hip Exercises: Aerobic   Nustep Level 2 x 5 minutes                PT Education - 01/24/17 1437    Education provided Yes   Education Details low load long duration flexion and extension stretches   Person(s) Educated Patient   Methods Explanation;Demonstration;Verbal cues;Handout   Comprehension Verbalized understanding          PT Short Term Goals - 01/24/17 1441      PT SHORT TERM GOAL #1   Title independent with HEP   Time 1   Period Weeks   Status New           PT Long Term Goals - 01/24/17 1441      PT LONG TERM GOAL #1   Title report 50% easier to get in and out of car, at eval she was using her arms to lift legs into the car   Time 8   Period Weeks   Status New     PT LONG TERM GOAL #2   Title decrease TUG time to 20 seconds   Time 8   Period Weeks   Status New     PT LONG TERM GOAL #3   Title reports pain decreased 25%   Time 8   Period Weeks   Status New     PT LONG TERM GOAL #4   Title increase AROM of the right knee to 0-110 degrees flexion   Time 8   Period Weeks   Status New     PT LONG TERM GOAL #5   Title able to do stairs step over step   Time 8   Period Weeks   Status New  Plan - 02/12/17 1438    Clinical Impression Statement Patient has had at least 3 falls over the past 6 months a fall about 3 months ago caused right knee pain, she underwent a right knee scope on 01/10/17 "to clean up the inside of the knee".  She reports that she has been very lethargic and run  down since the surgery, she reports feeling cold since the surgery.  She reports that the falls are from "vertigo"  We may need to test balance and vertigo in the future.   History and Personal Factors relevant to plan of care: past THR's   Clinical Presentation Evolving   Clinical Decision Making Low   Rehab Potential Good   PT Frequency 2x / week   PT Duration 8 weeks   PT Treatment/Interventions ADLs/Self Care Home Management;Cryotherapy;Electrical Stimulation;Functional mobility training;Gait training;Stair training;Therapeutic activities;Therapeutic exercise;Balance training;Neuromuscular re-education;Manual techniques;Patient/family education   PT Next Visit Plan could do a BERG, could try Dix-Halpike, progress knee ROM and strength as well as gait   Consulted and Agree with Plan of Care Patient      Patient will benefit from skilled therapeutic intervention in order to improve the following deficits and impairments:  Abnormal gait, Decreased activity tolerance, Decreased balance, Decreased mobility, Decreased strength, Increased edema, Pain, Decreased range of motion, Difficulty walking  Visit Diagnosis: Acute pain of right knee - Plan: PT plan of care cert/re-cert  Stiffness of right knee, not elsewhere classified - Plan: PT plan of care cert/re-cert  Difficulty in walking, not elsewhere classified - Plan: PT plan of care cert/re-cert  Localized swelling, mass and lump, right lower limb - Plan: PT plan of care cert/re-cert      G-Codes - 02-12-2017 1446    Functional Assessment Tool Used (Outpatient Only) foto 74% limitation   Functional Limitation Mobility: Walking and moving around   Mobility: Walking and Moving Around Current Status [phone]) At least 60 percent but less than 80 percent impaired, limited or restricted   Mobility: Walking and Moving Around Goal Status [phone]) At least 40 percent but less than 60 percent impaired, limited or restricted       Problem  List Patient Active Problem List   Diagnosis Date Noted  . OA (osteoarthritis) of hip 02/24/2016    Jearld Lesch., PT 02/12/17, 2:48 PM  [facility] [place] [address] [place], [place], [address] Phone: [phone]   Fax:  [phone]  Name: JAME SEELIG MRN: [id number] Date of Birth: 06-Mar-1931

## 2017-01-26 ENCOUNTER — Ambulatory Visit: Payer: Medicare Other | Admitting: Physical Therapy

## 2017-01-26 ENCOUNTER — Encounter: Payer: Self-pay | Admitting: Physical Therapy

## 2017-01-26 DIAGNOSIS — M25661 Stiffness of right knee, not elsewhere classified: Secondary | ICD-10-CM

## 2017-01-26 DIAGNOSIS — M25561 Pain in right knee: Secondary | ICD-10-CM

## 2017-01-26 DIAGNOSIS — R262 Difficulty in walking, not elsewhere classified: Secondary | ICD-10-CM

## 2017-01-26 NOTE — Therapy (Signed)
[facility] [place] [address] [place], [place], [address] Phone: [phone]   Fax:  [phone]  Physical Therapy Treatment  Patient Details  Name: Tina [name] MRN: [id number] Date of Birth: 10/17/1930 Referring Provider: Lequita HaltAluisio  Encounter Date: 01/26/2017      PT End of Session - 01/26/17 1754    Visit Number 2   Date for PT Re-Evaluation 03/26/17   PT Start Time 1659   PT Stop Time 1750   PT Time Calculation (min) 51 min   Activity Tolerance Patient tolerated treatment well   Behavior [facility] Therapy [facility] for tasks assessed/performed      Past Medical History:  Diagnosis Date  . Canker sore   . Cystocele   . Depression   . Diverticulosis   . Esophageal dysmotility   . Esophageal stricture   . GERD (gastroesophageal reflux disease)   . Heartburn   . Hypertension   . TIA (transient ischemic attack)     Past Surgical History:  Procedure Laterality Date  . APPENDECTOMY    . CATARACT EXTRACTION    . COLON SURGERY    . JOINT REPLACEMENT    . PARTIAL COLECTOMY  09/1991   due to diverticulitis  . SHOULDER SURGERY     right shoulder- removal calcium deposit  . TOTAL HIP ARTHROPLASTY  6/03  . TOTAL HIP ARTHROPLASTY Left 02/24/2016   Procedure: LEFT TOTAL HIP ARTHROPLASTY ANTERIOR APPROACH;  Surgeon: Ollen GrossFrank Aluisio, MD;  Location: [facility];  Service: Orthopedics;  Laterality: Left;    There were no vitals filed for this visit.      Subjective Assessment - 01/26/17 1704    Subjective Pt reports that she has been able to do her HEP 3-4 times since last appointment but not as much as directed because she has been busy. Reviewed HEP. Reported flex stretch is helpful but she had back pain with ext. when she leaned forward. Reports that she has had a little pain in her knee over the past few days but is trying not to take pain meds. Reports that she isn't feeling as cold but still doesn't feel like she has her energy  back. States that she has not had any falls since last seen.  Still has some swelling and warmth in the knee.    Currently in Pain? No/denies   Pain Score 0-No pain                         [facility] Adult PT Treatment/Exercise - 01/26/17 1715      Ambulation/Gait   Ambulation/Gait Yes   Ambulation/Gait Assistance 6: Modified independent (Device/Increase time)   Ambulation Distance (Feet) 200 Feet   Assistive device Straight cane   Ambulation Surface Indoor   Gait Comments Slow but steady gait, demonstrated proper use of cane.      High Level Balance   High Level Balance Activities Side stepping;Backward walking     Knee/Hip Exercises: Aerobic   Nustep Level 1 x 2.5 min and level 2 x 2.5 min     Knee/Hip Exercises: Seated   Sit to Sand 2 sets;10 reps  with Airex under her for elevation                PT Education - 01/26/17 1752    Education provided Yes   Education Details Reviewed low load long duration flexion and extension stretches and how to modify extension stretch to reduce  pain. Added strengthening exercises. Heel slides, quad sets with towel under knee, terminal knee ext. with towel under knee, and standing knee flexion.    Person(s) Educated Patient   Methods Explanation;Handout;Demonstration   Comprehension Verbalized understanding          PT Short Term Goals - 01/24/17 1441      PT SHORT TERM GOAL #1   Title independent with HEP   Time 1   Period Weeks   Status New           PT Long Term Goals - 01/24/17 1441      PT LONG TERM GOAL #1   Title report 50% easier to get in and out of car, at eval she was using her arms to lift legs into the car   Time 8   Period Weeks   Status New     PT LONG TERM GOAL #2   Title decrease TUG time to 20 seconds   Time 8   Period Weeks   Status New     PT LONG TERM GOAL #3   Title reports pain decreased 25%   Time 8   Period Weeks   Status New     PT LONG TERM GOAL #4   Title  increase AROM of the right knee to 0-110 degrees flexion   Time 8   Period Weeks   Status New     PT LONG TERM GOAL #5   Title able to do stairs step over step   Time 8   Period Weeks   Status New               Plan - 01/26/17 1755    Clinical Impression Statement Pt reported no pain today and did well with the exercises. She still reports having dizzy spells when leaning over to pick up something. Still has minimal swelling and warmth in the right knee.    PT Next Visit Plan Continue with strengthening and ROM exercises for knee. Would like to have her try the stairs. Continue with high level balance exercises. BERG still may be helpful.    Consulted and Agree with Plan of Care Patient      Patient will benefit from skilled therapeutic intervention in order to improve the following deficits and impairments:  Abnormal gait, Decreased activity tolerance, Decreased balance, Decreased mobility, Decreased strength, Increased edema, Pain, Decreased range of motion, Difficulty walking  Visit Diagnosis: Acute pain of right knee  Stiffness of right knee, not elsewhere classified  Difficulty in walking, not elsewhere classified     Problem List Patient Active Problem List   Diagnosis Date Noted  . OA (osteoarthritis) of hip 02/24/2016    Carin Primrose, SPT 01/26/2017, 6:08 PM  [facility] [place] [address] [place], [place], [address] Phone: [phone]   Fax:  [phone]  Name: Tina [name] MRN: [id number] Date of Birth: 24-Mar-1931

## 2017-01-30 ENCOUNTER — Encounter: Payer: Self-pay | Admitting: Physical Therapy

## 2017-01-30 ENCOUNTER — Ambulatory Visit: Payer: Medicare Other | Attending: Orthopedic Surgery | Admitting: Physical Therapy

## 2017-01-30 DIAGNOSIS — R262 Difficulty in walking, not elsewhere classified: Secondary | ICD-10-CM | POA: Diagnosis present

## 2017-01-30 DIAGNOSIS — R2241 Localized swelling, mass and lump, right lower limb: Secondary | ICD-10-CM | POA: Insufficient documentation

## 2017-01-30 DIAGNOSIS — M25561 Pain in right knee: Secondary | ICD-10-CM | POA: Diagnosis present

## 2017-01-30 DIAGNOSIS — M25661 Stiffness of right knee, not elsewhere classified: Secondary | ICD-10-CM | POA: Diagnosis present

## 2017-01-30 NOTE — Therapy (Signed)
[facility] [address] [place], [place], [address] Phone: [phone]   Fax:  [phone]  Physical Therapy Treatment  Patient Details  Name: Tina ConferLouise T [name] MRN: [id number] Date of Birth: 04/20/1931 Referring Provider: Lequita HaltAluisio  Encounter Date: 01/30/2017      PT End of Session - 01/30/17 1651    Visit Number 3   Date for PT Re-Evaluation 03/26/17   PT Start Time 1614   PT Stop Time 1700   PT Time Calculation (min) 46 min   Activity Tolerance Patient tolerated treatment well   Behavior During Therapy [facility] for tasks assessed/performed      Past Medical History:  Diagnosis Date  . Canker sore   . Cystocele   . Depression   . Diverticulosis   . Esophageal dysmotility   . Esophageal stricture   . GERD (gastroesophageal reflux disease)   . Heartburn   . Hypertension   . TIA (transient ischemic attack)     Past Surgical History:  Procedure Laterality Date  . APPENDECTOMY    . CATARACT EXTRACTION    . COLON SURGERY    . JOINT REPLACEMENT    . PARTIAL COLECTOMY  09/1991   due to diverticulitis  . SHOULDER SURGERY     right shoulder- removal calcium deposit  . TOTAL HIP ARTHROPLASTY  6/03  . TOTAL HIP ARTHROPLASTY Left 02/24/2016   Procedure: LEFT TOTAL HIP ARTHROPLASTY ANTERIOR APPROACH;  Surgeon: Ollen GrossFrank Aluisio, MD;  Location: [facility];  Service: Orthopedics;  Laterality: Left;    There were no vitals filed for this visit.      Subjective Assessment - 01/30/17 1617    Subjective Pt reports that she has been feeling pretty good since her last appointment. Reports that she has been about 60% compliant with HEP but has been very busy.    Currently in Pain? Yes   Pain Score 4    Pain Location Knee   Pain Orientation Right   Pain Radiating Towards Pt reports today that she is having some pain up into her quad and hip.                          [facility] Adult PT Treatment/Exercise - 01/30/17  0001      Ambulation/Gait   Gait Comments Stairs: down used R leg first, coming up was able to do step over step with some circumduction of R foot     High Level Balance   High Level Balance Activities Side stepping;Backward walking  Side stepping with yellow T-band around ankles     Knee/Hip Exercises: Aerobic   Nustep Level 2 for 6 min. seat position 8.      Knee/Hip Exercises: Machines for Strengthening   Cybex Knee Extension #5 2x15   Cybex Knee Flexion #20 3x10     Knee/Hip Exercises: Seated   Sit to Sand 2 sets  5 reps first set, 10 for 2nd,without the Airex                  PT Short Term Goals - 01/24/17 1441      PT SHORT TERM GOAL #1   Title independent with HEP   Time 1   Period Weeks   Status New           PT Long Term Goals - 01/24/17 1441      PT LONG TERM GOAL #1   Title report 50%  easier to get in and out of car, at eval she was using her arms to lift legs into the car   Time 8   Period Weeks   Status New     PT LONG TERM GOAL #2   Title decrease TUG time to 20 seconds   Time 8   Period Weeks   Status New     PT LONG TERM GOAL #3   Title reports pain decreased 25%   Time 8   Period Weeks   Status New     PT LONG TERM GOAL #4   Title increase AROM of the right knee to 0-110 degrees flexion   Time 8   Period Weeks   Status New     PT LONG TERM GOAL #5   Title able to do stairs step over step   Time 8   Period Weeks   Status New               Plan - 01/30/17 1652    Clinical Impression Statement Pt did well with the exercises today. Had some stiffness and discomfort in L knee when she tried to go down the stairs with the R leg first.    PT Treatment/Interventions ADLs/Self Care Home Management;Cryotherapy;Electrical Stimulation;Functional mobility training;Gait training;Stair training;Therapeutic activities;Therapeutic exercise;Balance training;Neuromuscular re-education;Manual techniques;Patient/family education    PT Next Visit Plan Continue with strengthening and ROM exercises for knee. Would like continue with the stairs. Continue with high level balance exercises.    Consulted and Agree with Plan of Care Patient      Patient will benefit from skilled therapeutic intervention in order to improve the following deficits and impairments:  Abnormal gait, Decreased activity tolerance, Decreased balance, Decreased mobility, Decreased strength, Increased edema, Pain, Decreased range of motion, Difficulty walking  Visit Diagnosis: Acute pain of right knee  Stiffness of right knee, not elsewhere classified  Difficulty in walking, not elsewhere classified  Localized swelling, mass and lump, right lower limb     Problem List Patient Active Problem List   Diagnosis Date Noted  . OA (osteoarthritis) of hip 02/24/2016    Tina [name], SPT 01/30/2017, 5:10 PM  [facility] [address] [place], [place], [address] Phone: [phone]   Fax:  [phone]  Name: Tina [name] MRN: [id number] Date of Birth: 01/07/1931

## 2017-02-02 ENCOUNTER — Ambulatory Visit: Payer: Medicare Other | Admitting: Physical Therapy

## 2017-02-02 ENCOUNTER — Encounter: Payer: Self-pay | Admitting: Physical Therapy

## 2017-02-02 DIAGNOSIS — R262 Difficulty in walking, not elsewhere classified: Secondary | ICD-10-CM

## 2017-02-02 DIAGNOSIS — M25661 Stiffness of right knee, not elsewhere classified: Secondary | ICD-10-CM

## 2017-02-02 DIAGNOSIS — M25561 Pain in right knee: Secondary | ICD-10-CM | POA: Diagnosis not present

## 2017-02-02 DIAGNOSIS — R2241 Localized swelling, mass and lump, right lower limb: Secondary | ICD-10-CM

## 2017-02-02 NOTE — Therapy (Signed)
[place], [place], [address] Phone: [phone]   Fax:  [phone]  Physical Therapy Treatment  Patient Details  Name: Tina [name] MRN: [id number] Date of Birth: 1931/06/09 Referring Provider: Wynelle Link  Encounter Date: 02/02/2017      PT End of Session - 02/02/17 1701    Visit Number 4   Date for PT Re-Evaluation 03/26/17   PT Start Time 0539   PT Stop Time 1655   PT Time Calculation (min) 48 min   Activity Tolerance Patient tolerated treatment well;Patient limited by pain   Behavior During Therapy [facility] for tasks assessed/performed      Past Medical History:  Diagnosis Date  . Canker sore   . Cystocele   . Depression   . Diverticulosis   . Esophageal dysmotility   . Esophageal stricture   . GERD (gastroesophageal reflux disease)   . Heartburn   . Hypertension   . TIA (transient ischemic attack)     Past Surgical History:  Procedure Laterality Date  . APPENDECTOMY    . CATARACT EXTRACTION    . COLON SURGERY    . JOINT REPLACEMENT    . PARTIAL COLECTOMY  09/1991   due to diverticulitis  . SHOULDER SURGERY     right shoulder- removal calcium deposit  . TOTAL HIP ARTHROPLASTY  6/03  . TOTAL HIP ARTHROPLASTY Left 02/24/2016   Procedure: LEFT TOTAL HIP ARTHROPLASTY ANTERIOR APPROACH;  Surgeon: Gaynelle Arabian, MD;  Location: [facility];  Service: Orthopedics;  Laterality: Left;    There were no vitals filed for this visit.      Subjective Assessment - 02/02/17 1611    Subjective Pt reports that she is having a slow day.  Still has a lot of soreness in hip and knee. Said that her knee feels tired in the morning when she first starts moving she is not having increased pain after PT.   Ice helps with the soreness.    Currently in Pain? Yes   Pain Score 2             [facility] PT Assessment - 02/02/17 0001      AROM   AROM Assessment Site Knee   Right/Left Knee Right   Right Knee  Extension 11   Right Knee Flexion 104     Timed Up and Go Test   TUG Normal TUG   Normal TUG (seconds) 17                     [facility] Adult PT Treatment/Exercise - 02/02/17 0001      High Level Balance   High Level Balance Activities Side stepping;Backward walking     Knee/Hip Exercises: Aerobic   Nustep Level 3 for 6 min. seat position 8.      Knee/Hip Exercises: Machines for Strengthening   Cybex Knee Extension #5 2x15   Cybex Knee Flexion #20 2x15     Knee/Hip Exercises: Standing   Knee Flexion 1 set   Knee Flexion Limitations Started with back against the wall and 2# ankle weights, by 5 rep Pt had pain in R knee. Switched to 1#, but then her R knee buckled in stance phase so we terminated the exercise.      Knee/Hip Exercises: Seated   Sit to Sand 2 sets;10 reps                  PT Short Term Goals -  02/02/17 1710      PT SHORT TERM GOAL #1   Title independent with HEP   Time 1   Period Weeks   Status Partially Met           PT Long Term Goals - 02/02/17 1615      PT LONG TERM GOAL #1   Title report 50% easier to get in and out of car, at eval she was using her arms to lift legs into the car   Time 8   Period Weeks   Status On-going     PT LONG TERM GOAL #2   Title decrease TUG time to 20 seconds   Time 8   Period Weeks   Status Achieved     PT LONG TERM GOAL #3   Title reports pain decreased 25%   Time 8   Period Weeks   Status On-going     PT LONG TERM GOAL #4   Title increase AROM of the right knee to 0-110 degrees flexion   Time 8   Period Weeks   Status On-going     PT LONG TERM GOAL #5   Title able to do stairs step over step   Time 8   Period Weeks   Status On-going               Plan - 02/02/17 1703    Clinical Impression Statement Pt did well with the exercises today. Said that she was feeling slow and not great when she came in. She has had improvements with her TUG and AROM but is still weak and  has instability in her R knee. R knee buckled under her during single leg stance part of hip flexion exercises against the wall.    PT Treatment/Interventions ADLs/Self Care Home Management;Cryotherapy;Electrical Stimulation;Functional mobility training;Gait training;Stair training;Therapeutic activities;Therapeutic exercise;Balance training;Neuromuscular re-education;Manual techniques;Patient/family education   PT Next Visit Plan Continue with strengthening and balance exercises. Continue to work on stairs and try hip flexion extercises against the wall again, without weight. Might be better to do this earlier in the session to prevent fatigue.    PT Home Exercise Plan Pt is doing knee flexion and extension stretches, and standing knee extercises.    Consulted and Agree with Plan of Care Patient      Patient will benefit from skilled therapeutic intervention in order to improve the following deficits and impairments:  Abnormal gait, Decreased activity tolerance, Decreased balance, Decreased mobility, Decreased strength, Increased edema, Pain, Decreased range of motion, Difficulty walking  Visit Diagnosis: Acute pain of right knee  Stiffness of right knee, not elsewhere classified  Difficulty in walking, not elsewhere classified  Localized swelling, mass and lump, right lower limb     Problem List Patient Active Problem List   Diagnosis Date Noted  . OA (osteoarthritis) of hip 02/24/2016    Lennart Pall, SPT 02/02/2017, 5:19 PM  [place], [place], [address] Phone: [phone]   Fax:  [phone]  Name: Tina [name] MRN: [id number] Date of Birth: [birth year]

## 2017-02-06 ENCOUNTER — Encounter: Payer: Self-pay | Admitting: Physical Therapy

## 2017-02-06 ENCOUNTER — Ambulatory Visit: Payer: Medicare Other | Admitting: Physical Therapy

## 2017-02-06 DIAGNOSIS — R262 Difficulty in walking, not elsewhere classified: Secondary | ICD-10-CM

## 2017-02-06 DIAGNOSIS — M25661 Stiffness of right knee, not elsewhere classified: Secondary | ICD-10-CM

## 2017-02-06 DIAGNOSIS — M25561 Pain in right knee: Secondary | ICD-10-CM | POA: Diagnosis not present

## 2017-02-06 DIAGNOSIS — R2241 Localized swelling, mass and lump, right lower limb: Secondary | ICD-10-CM

## 2017-02-06 NOTE — Therapy (Signed)
[place], [place], [address] Phone: [phone]   Fax:  [phone]  Physical Therapy Treatment  Patient Details  Name: KALKIDAN CAUDELL MRN: [id number] Date of Birth: 1930/11/20 Referring Provider: Wynelle Link  Encounter Date: 02/06/2017      PT End of Session - 02/06/17 1711    Visit Number 5   Date for PT Re-Evaluation 03/26/17   PT Start Time 1654   PT Stop Time 1745   PT Time Calculation (min) 51 min   Activity Tolerance Patient tolerated treatment well   Behavior During Therapy [facility] for tasks assessed/performed      Past Medical History:  Diagnosis Date  . Canker sore   . Cystocele   . Depression   . Diverticulosis   . Esophageal dysmotility   . Esophageal stricture   . GERD (gastroesophageal reflux disease)   . Heartburn   . Hypertension   . TIA (transient ischemic attack)     Past Surgical History:  Procedure Laterality Date  . APPENDECTOMY    . CATARACT EXTRACTION    . COLON SURGERY    . JOINT REPLACEMENT    . PARTIAL COLECTOMY  09/1991   due to diverticulitis  . SHOULDER SURGERY     right shoulder- removal calcium deposit  . TOTAL HIP ARTHROPLASTY  6/03  . TOTAL HIP ARTHROPLASTY Left 02/24/2016   Procedure: LEFT TOTAL HIP ARTHROPLASTY ANTERIOR APPROACH;  Surgeon: Gaynelle Arabian, MD;  Location: [facility];  Service: Orthopedics;  Laterality: Left;    There were no vitals filed for this visit.      Subjective Assessment - 02/06/17 1658    Subjective Pt reports that she has been okay. Says that she has worn herself out today with a lot of cooking. Says that she is still having some swelling and ice helps, but less pain overall. Reports one instance of feeling like her knee was going to give out underneath her but it didn't.    Patient Stated Goals have less pain, walk better   Currently in Pain? No/denies   Pain Score 0-No pain                         [facility]  Adult PT Treatment/Exercise - 02/06/17 0001      High Level Balance   High Level Balance Activities Side stepping;Backward walking   High Level Balance Comments side stepping with Red Theraband.     Knee/Hip Exercises: Aerobic   Nustep Level 3 for 6 min. seat position 8.      Knee/Hip Exercises: Machines for Strengthening   Cybex Knee Extension #5 2x15   Cybex Knee Flexion #20 2x15     Knee/Hip Exercises: Standing   Heel Raises Both;2 sets;15 reps  holding onto sink   Knee Flexion 2 sets;Both;15 reps  holding onto sink   Hip Flexion Both;2 sets;15 reps  holding onto sink   Hip Extension Both;2 sets;15 reps  holding onto sink     Knee/Hip Exercises: Seated   Sit to Sand 2 sets;10 reps                  PT Short Term Goals - 02/02/17 1710      PT SHORT TERM GOAL #1   Title independent with HEP   Time 1   Period Weeks   Status Partially Met           PT Long  Term Goals - 02/02/17 1615      PT LONG TERM GOAL #1   Title report 50% easier to get in and out of car, at eval she was using her arms to lift legs into the car   Time 8   Period Weeks   Status On-going     PT LONG TERM GOAL #2   Title decrease TUG time to 20 seconds   Time 8   Period Weeks   Status Achieved     PT LONG TERM GOAL #3   Title reports pain decreased 25%   Time 8   Period Weeks   Status On-going     PT LONG TERM GOAL #4   Title increase AROM of the right knee to 0-110 degrees flexion   Time 8   Period Weeks   Status On-going     PT LONG TERM GOAL #5   Title able to do stairs step over step   Time 8   Period Weeks   Status On-going               Plan - 02/06/17 1713    Clinical Impression Statement Pt did well with exercises today. She is tired from cooking this morning but progressing well. She has reduced pain overall.    PT Treatment/Interventions ADLs/Self Care Home Management;Cryotherapy;Electrical Stimulation;Functional mobility training;Gait  training;Stair training;Therapeutic activities;Therapeutic exercise;Balance training;Neuromuscular re-education;Manual techniques;Patient/family education   PT Next Visit Plan Continue with strengthening and balance exercises. Continue to work on stairs and try hip flexion extercises against the wall or at sink, without weight.    PT Home Exercise Plan Pt is doing knee flexion and extension stretches, and standing knee extercises.    Consulted and Agree with Plan of Care Patient      Patient will benefit from skilled therapeutic intervention in order to improve the following deficits and impairments:  Abnormal gait, Decreased activity tolerance, Decreased balance, Decreased mobility, Decreased strength, Increased edema, Pain, Decreased range of motion, Difficulty walking  Visit Diagnosis: Acute pain of right knee  Stiffness of right knee, not elsewhere classified  Difficulty in walking, not elsewhere classified  Localized swelling, mass and lump, right lower limb     Problem List Patient Active Problem List   Diagnosis Date Noted  . OA (osteoarthritis) of hip 02/24/2016    Lennart Pall, SPT 02/06/2017, 5:49 PM  [place], [place], [address] Phone: [phone]   Fax:  [phone]  Name: Tina [name] MRN: [id number] Date of Birth: [birth year]

## 2017-02-09 ENCOUNTER — Ambulatory Visit: Payer: Medicare Other | Admitting: Physical Therapy

## 2017-02-09 ENCOUNTER — Encounter: Payer: Self-pay | Admitting: Physical Therapy

## 2017-02-09 DIAGNOSIS — M25561 Pain in right knee: Secondary | ICD-10-CM | POA: Diagnosis not present

## 2017-02-09 DIAGNOSIS — R262 Difficulty in walking, not elsewhere classified: Secondary | ICD-10-CM

## 2017-02-09 DIAGNOSIS — M25661 Stiffness of right knee, not elsewhere classified: Secondary | ICD-10-CM

## 2017-02-09 NOTE — Therapy (Signed)
[place], [place], [address] Phone: [phone]   Fax:  [phone]  Physical Therapy Treatment  Patient Details  Name: Tina [name] MRN: [id number] Date of Birth: 08-04-1930 Referring Provider: Wynelle Link  Encounter Date: 02/09/2017      PT End of Session - 02/09/17 1738    Visit Number 6   Date for PT Re-Evaluation 03/26/17   PT Start Time 1655   PT Stop Time 1745   PT Time Calculation (min) 50 min   Activity Tolerance Patient tolerated treatment well   Behavior During Therapy [facility] for tasks assessed/performed      Past Medical History:  Diagnosis Date  . Canker sore   . Cystocele   . Depression   . Diverticulosis   . Esophageal dysmotility   . Esophageal stricture   . GERD (gastroesophageal reflux disease)   . Heartburn   . Hypertension   . TIA (transient ischemic attack)     Past Surgical History:  Procedure Laterality Date  . APPENDECTOMY    . CATARACT EXTRACTION    . COLON SURGERY    . JOINT REPLACEMENT    . PARTIAL COLECTOMY  09/1991   due to diverticulitis  . SHOULDER SURGERY     right shoulder- removal calcium deposit  . TOTAL HIP ARTHROPLASTY  6/03  . TOTAL HIP ARTHROPLASTY Left 02/24/2016   Procedure: LEFT TOTAL HIP ARTHROPLASTY ANTERIOR APPROACH;  Surgeon: Gaynelle Arabian, MD;  Location: [facility];  Service: Orthopedics;  Laterality: Left;    There were no vitals filed for this visit.      Subjective Assessment - 02/09/17 1704    Subjective Pt reports that she is tired today, she has been out doing errands, but she said she is moving slower today. She reports that she is still getting dizzy spells when she leans her head over. She reports the knee feels better over all.    Patient Stated Goals have less pain, walk better   Currently in Pain? No/denies   Pain Score 0-No pain                         [facility] Adult PT Treatment/Exercise - 02/09/17 0001       Knee/Hip Exercises: Aerobic   Elliptical Resistance 4 for 2 min then Pt reported she was too tired   Nustep Level 5 for 6 min. seat position 8.      Knee/Hip Exercises: Machines for Strengthening   Cybex Leg Press 20# 2x15     Knee/Hip Exercises: Standing   Other Standing Knee Exercises Single leg step up on Airex with opposite leg hip flexion. 2x10 both sides                PT Education - 02/09/17 1737    Education Details Posterior canal and vertigo symptoms.    Person(s) Educated Patient   Methods Explanation   Comprehension Verbalized understanding          PT Short Term Goals - 02/09/17 1741      PT SHORT TERM GOAL #1   Title independent with HEP   Time 1   Period Weeks   Status Achieved           PT Long Term Goals - 02/09/17 1742      PT LONG TERM GOAL #1   Title report 50% easier to get in and out of car, at eval she  was using her arms to lift legs into the car   Time 8   Period Weeks   Status Partially Met     PT LONG TERM GOAL #3   Title reports pain decreased 25%   Time 8   Period Weeks   Status Achieved     PT LONG TERM GOAL #5   Title able to do stairs step over step   Time 8   Period Weeks   Status On-going               Plan - 02/09/17 1738    Clinical Impression Statement Pt was a little tired today and needed more rest time. Her knee pain is improving and she seems to be gaining muscle endurance. Her balance also seems to be improving.   PT Treatment/Interventions ADLs/Self Care Home Management;Cryotherapy;Electrical Stimulation;Functional mobility training;Gait training;Stair training;Therapeutic activities;Therapeutic exercise;Balance training;Neuromuscular re-education;Manual techniques;Patient/family education   PT Next Visit Plan Continue with strengthening and balance exercises. Continue to work on stairs and try hip flexion extercises against the wall or at sink, without weight.    PT Home Exercise Plan Pt is  doing knee flexion and extension stretches, and standing knee extercises.    Consulted and Agree with Plan of Care Patient      Patient will benefit from skilled therapeutic intervention in order to improve the following deficits and impairments:  Abnormal gait, Decreased activity tolerance, Decreased balance, Decreased mobility, Decreased strength, Increased edema, Pain, Decreased range of motion, Difficulty walking  Visit Diagnosis: Acute pain of right knee  Stiffness of right knee, not elsewhere classified  Difficulty in walking, not elsewhere classified     Problem List Patient Active Problem List   Diagnosis Date Noted  . OA (osteoarthritis) of hip 02/24/2016    Lennart Pall, SPT 02/09/2017, 5:55 PM  [place], [place], [address] Phone: [phone]   Fax:  [phone]  Name: SHELLEY COCKE MRN: [id number] Date of Birth: 18-Apr-1931

## 2017-02-14 ENCOUNTER — Ambulatory Visit: Payer: Medicare Other | Admitting: Physical Therapy

## 2017-02-14 ENCOUNTER — Encounter: Payer: Self-pay | Admitting: Physical Therapy

## 2017-02-14 DIAGNOSIS — M25561 Pain in right knee: Secondary | ICD-10-CM | POA: Diagnosis not present

## 2017-02-14 DIAGNOSIS — R262 Difficulty in walking, not elsewhere classified: Secondary | ICD-10-CM

## 2017-02-14 DIAGNOSIS — M25661 Stiffness of right knee, not elsewhere classified: Secondary | ICD-10-CM

## 2017-02-14 NOTE — Therapy (Signed)
[place], [place], [address] Phone: [phone]   Fax:  [phone]  Physical Therapy Treatment  Patient Details  Name: Tina [name] MRN: [id number] Date of Birth: 1930/10/18 Referring Provider: Wynelle Link  Encounter Date: 02/14/2017      PT End of Session - 02/14/17 1716    Visit Number 7   Date for PT Re-Evaluation 03/26/17   PT Start Time 1694   PT Stop Time 1710   PT Time Calculation (min) 57 min   Activity Tolerance Patient tolerated treatment well   Behavior During Therapy [facility] for tasks assessed/performed      Past Medical History:  Diagnosis Date  . Canker sore   . Cystocele   . Depression   . Diverticulosis   . Esophageal dysmotility   . Esophageal stricture   . GERD (gastroesophageal reflux disease)   . Heartburn   . Hypertension   . TIA (transient ischemic attack)     Past Surgical History:  Procedure Laterality Date  . APPENDECTOMY    . CATARACT EXTRACTION    . COLON SURGERY    . JOINT REPLACEMENT    . PARTIAL COLECTOMY  09/1991   due to diverticulitis  . SHOULDER SURGERY     right shoulder- removal calcium deposit  . TOTAL HIP ARTHROPLASTY  6/03  . TOTAL HIP ARTHROPLASTY Left 02/24/2016   Procedure: LEFT TOTAL HIP ARTHROPLASTY ANTERIOR APPROACH;  Surgeon: Gaynelle Arabian, MD;  Location: [facility];  Service: Orthopedics;  Laterality: Left;    There were no vitals filed for this visit.      Subjective Assessment - 02/14/17 1619    Subjective Pt reports that she feels good today. Said her energy feels higher. Reports no dizzy spells since her last appointment. Pt reports that her knee is doing well. Still has stiffness and soreness when she starts moving in the morning but it gets better throughout the day.    Patient Stated Goals have less pain, walk better   Currently in Pain? No/denies   Pain Score 0-No pain            [facility] PT Assessment - 02/14/17 0001       AROM   AROM Assessment Site Knee   Right/Left Knee Right   Right Knee Extension 10   Right Knee Flexion 110                     [facility] Adult PT Treatment/Exercise - 02/14/17 0001      Ambulation/Gait   Stairs Yes   Stairs Assistance 6: Modified independent (Device/Increase time)   Stair Management Technique One rail Right;Step to pattern  hand hold on left, pt offloaded a lot of weight onto rail   Number of Stairs 24   Height of Stairs 6     High Level Balance   High Level Balance Comments Obstacle course with pillows, bosu, boxes, rolls, airex.      Knee/Hip Exercises: Aerobic   Nustep Level 5 for 6 min. seat position 8.      Knee/Hip Exercises: Machines for Strengthening   Cybex Knee Extension #5 2x15   Cybex Knee Flexion #20 2x15   Cybex Leg Press 30# 3x10                  PT Short Term Goals - 02/09/17 1741      PT SHORT TERM GOAL #1   Title independent with HEP  Time 1   Period Weeks   Status Achieved           PT Long Term Goals - 02/14/17 1651      PT LONG TERM GOAL #1   Title report 50% easier to get in and out of car, at eval she was using her arms to lift legs into the car   Time 8   Period Weeks   Status Achieved     PT LONG TERM GOAL #4   Title increase AROM of the right knee to 0-110 degrees flexion   Time 8   Period Weeks   Status Partially Met     PT LONG TERM GOAL #5   Title able to do stairs step over step   Time 8   Period Weeks   Status Partially Met               Plan - 02/14/17 1717    Clinical Impression Statement Pt did well today. She is improving with the stairs, strength exercises, and reports less pain with her knee. Said she is only having stiffness and soreness when she first starts moving. She still has lacks 10 degress of AROM with extension and needs a lot of support to complete stairs step over step.    PT Treatment/Interventions ADLs/Self Care Home Management;Cryotherapy;Electrical  Stimulation;Functional mobility training;Gait training;Stair training;Therapeutic activities;Therapeutic exercise;Balance training;Neuromuscular re-education;Manual techniques;Patient/family education   PT Next Visit Plan Continue with strengthening and balance exercises. Continue to work on stairs and try hip flexion extercises against the wall or at sink, without weight.    PT Home Exercise Plan Pt is doing knee flexion and extension stretches, and standing knee extercises.    Consulted and Agree with Plan of Care Patient      Patient will benefit from skilled therapeutic intervention in order to improve the following deficits and impairments:  Abnormal gait, Decreased activity tolerance, Decreased balance, Decreased mobility, Decreased strength, Increased edema, Pain, Decreased range of motion, Difficulty walking  Visit Diagnosis: Acute pain of right knee  Stiffness of right knee, not elsewhere classified  Difficulty in walking, not elsewhere classified     Problem List Patient Active Problem List   Diagnosis Date Noted  . OA (osteoarthritis) of hip 02/24/2016    Lennart Pall, SPT 02/14/2017, 5:25 PM  [place], [place], [address] Phone: [phone]   Fax:  [phone]  Name: Tina [name] MRN: [id number] Date of Birth: [birth year]

## 2017-02-21 ENCOUNTER — Ambulatory Visit: Payer: Medicare Other | Admitting: Physical Therapy

## 2017-02-21 ENCOUNTER — Encounter: Payer: Self-pay | Admitting: Physical Therapy

## 2017-02-21 DIAGNOSIS — M25561 Pain in right knee: Secondary | ICD-10-CM | POA: Diagnosis not present

## 2017-02-21 DIAGNOSIS — R262 Difficulty in walking, not elsewhere classified: Secondary | ICD-10-CM

## 2017-02-21 DIAGNOSIS — R2241 Localized swelling, mass and lump, right lower limb: Secondary | ICD-10-CM

## 2017-02-21 DIAGNOSIS — M25661 Stiffness of right knee, not elsewhere classified: Secondary | ICD-10-CM

## 2017-02-21 NOTE — Therapy (Signed)
[place], [place], [address] Phone: [phone]   Fax:  [phone]  Physical Therapy Treatment  Patient Details  Name: Tina [name] MRN: [id number] Date of Birth: 06/07/1931 Referring Provider: Wynelle Link  Encounter Date: 02/21/2017      PT End of Session - 02/21/17 1658    Visit Number 8   Date for PT Re-Evaluation 03/26/17   PT Start Time 1610   PT Stop Time 1655   PT Time Calculation (min) 45 min   Activity Tolerance Patient tolerated treatment well   Behavior During Therapy [facility] for tasks assessed/performed      Past Medical History:  Diagnosis Date  . Canker sore   . Cystocele   . Depression   . Diverticulosis   . Esophageal dysmotility   . Esophageal stricture   . GERD (gastroesophageal reflux disease)   . Heartburn   . Hypertension   . TIA (transient ischemic attack)     Past Surgical History:  Procedure Laterality Date  . APPENDECTOMY    . CATARACT EXTRACTION    . COLON SURGERY    . JOINT REPLACEMENT    . PARTIAL COLECTOMY  09/1991   due to diverticulitis  . SHOULDER SURGERY     right shoulder- removal calcium deposit  . TOTAL HIP ARTHROPLASTY  6/03  . TOTAL HIP ARTHROPLASTY Left 02/24/2016   Procedure: LEFT TOTAL HIP ARTHROPLASTY ANTERIOR APPROACH;  Surgeon: Gaynelle Arabian, MD;  Location: [facility];  Service: Orthopedics;  Laterality: Left;    There were no vitals filed for this visit.      Subjective Assessment - 02/21/17 1613    Subjective Reports that knee is feeling good. Michela Pitcher it is still swollen a little. Dr. Wynelle Link offered to give her a shot to help the swelling but Pt asked to wait and see if it would get better on it's own with PT. She has a return appt in 5 weeks.    Patient Stated Goals have less pain, walk better   Currently in Pain? No/denies   Pain Score 0-No pain            [facility] PT Assessment - 02/21/17 0001      Timed Up and Go Test   TUG  Normal TUG   Normal TUG (seconds) 13                     [facility] Adult PT Treatment/Exercise - 02/21/17 0001      High Level Balance   High Level Balance Activities Braiding;Direction changes  1 HHA with braiding     Knee/Hip Exercises: Aerobic   Nustep Level 5 for 6 min. seat position 7.      Knee/Hip Exercises: Machines for Strengthening   Cybex Knee Extension #10 2x15   Cybex Knee Flexion #25 3x10   Cybex Leg Press 30# 2x15     Knee/Hip Exercises: Standing   Other Standing Knee Exercises Monster walks with Red Tband - 60 feet                  PT Short Term Goals - 02/09/17 1741      PT SHORT TERM GOAL #1   Title independent with HEP   Time 1   Period Weeks   Status Achieved           PT Long Term Goals - 02/21/17 1701      PT LONG TERM GOAL #1  Title report 50% easier to get in and out of car, at eval she was using her arms to lift legs into the car   Time 8   Period Weeks   Status Achieved     PT LONG TERM GOAL #2   Title decrease TUG time to 20 seconds   Time 8   Period Weeks   Status Achieved     PT LONG TERM GOAL #3   Title reports pain decreased 25%   Time 8   Period Weeks   Status Achieved     PT LONG TERM GOAL #4   Title increase AROM of the right knee to 0-110 degrees flexion   Time 8   Period Weeks   Status Partially Met     PT LONG TERM GOAL #5   Title able to do stairs step over step   Time 8   Period Weeks   Status Partially Met               Plan - 02/21/17 1700    Clinical Impression Statement Pt did very well with her exercises. We increased the weight on most of her exercises, and she did them very slowly but she is diligent about doing them correctly. Pt did not report any pain today and she is having better ROM. Her TUG improved from 17 to 13 sec.    PT Treatment/Interventions ADLs/Self Care Home Management;Cryotherapy;Electrical Stimulation;Functional mobility training;Gait training;Stair  training;Therapeutic activities;Therapeutic exercise;Balance training;Neuromuscular re-education;Manual techniques;Patient/family education   PT Next Visit Plan Continue with strengthening and balance exercises. Continue to work on stairs and try hip flexion extercises against the wall or at sink, without weight.    PT Home Exercise Plan Pt is doing knee flexion and extension stretches, and standing knee extercises.    Consulted and Agree with Plan of Care Patient      Patient will benefit from skilled therapeutic intervention in order to improve the following deficits and impairments:  Abnormal gait, Decreased activity tolerance, Decreased balance, Decreased mobility, Decreased strength, Increased edema, Pain, Decreased range of motion, Difficulty walking  Visit Diagnosis: Acute pain of right knee  Stiffness of right knee, not elsewhere classified  Difficulty in walking, not elsewhere classified  Localized swelling, mass and lump, right lower limb     Problem List Patient Active Problem List   Diagnosis Date Noted  . OA (osteoarthritis) of hip 02/24/2016    Lennart Pall, SPT 02/21/2017, 5:07 PM  [place], [place], [address] Phone: [phone]   Fax:  [phone]  Name: Tina [name] MRN: [id number] Date of Birth: Sep 22, 1930

## 2017-02-23 ENCOUNTER — Ambulatory Visit: Payer: Medicare Other | Admitting: Physical Therapy

## 2017-02-23 ENCOUNTER — Encounter: Payer: Self-pay | Admitting: Physical Therapy

## 2017-02-23 DIAGNOSIS — M25561 Pain in right knee: Secondary | ICD-10-CM

## 2017-02-23 DIAGNOSIS — M25661 Stiffness of right knee, not elsewhere classified: Secondary | ICD-10-CM

## 2017-02-23 DIAGNOSIS — R262 Difficulty in walking, not elsewhere classified: Secondary | ICD-10-CM

## 2017-02-23 NOTE — Therapy (Signed)
[place], [place], [address] Phone: [phone]   Fax:  [phone]  Physical Therapy Treatment  Patient Details  Name: Tina [name] MRN: [id number] Date of Birth: [birth year] Referring Provider: Wynelle Link  Encounter Date: 02/23/2017      PT End of Session - 02/23/17 1750    Visit Number 9   Date for PT Re-Evaluation 03/26/17   PT Start Time 1659   PT Stop Time 1747   PT Time Calculation (min) 48 min   Activity Tolerance Patient tolerated treatment well   Behavior During Therapy [facility] for tasks assessed/performed      Past Medical History:  Diagnosis Date  . Canker sore   . Cystocele   . Depression   . Diverticulosis   . Esophageal dysmotility   . Esophageal stricture   . GERD (gastroesophageal reflux disease)   . Heartburn   . Hypertension   . TIA (transient ischemic attack)     Past Surgical History:  Procedure Laterality Date  . APPENDECTOMY    . CATARACT EXTRACTION    . COLON SURGERY    . JOINT REPLACEMENT    . PARTIAL COLECTOMY  09/1991   due to diverticulitis  . SHOULDER SURGERY     right shoulder- removal calcium deposit  . TOTAL HIP ARTHROPLASTY  6/03  . TOTAL HIP ARTHROPLASTY Left 02/24/2016   Procedure: LEFT TOTAL HIP ARTHROPLASTY ANTERIOR APPROACH;  Surgeon: Gaynelle Arabian, MD;  Location: [facility];  Service: Orthopedics;  Laterality: Left;    There were no vitals filed for this visit.      Subjective Assessment - 02/23/17 1703    Subjective Pt reports that she was sore after we last saw her, but she is feeling good today.  Does report a little painful spot along the ITB insertion of her R knee.    Patient Stated Goals have less pain, walk better   Currently in Pain? No/denies   Pain Score 0-No pain                         [facility] Adult PT Treatment/Exercise - 02/23/17 0001      Ambulation/Gait   Stairs Yes   Stairs Assistance 6: Modified  independent (Device/Increase time)   Stair Management Technique One rail Right  Pt pulled through hand on the hand rail.   Number of Stairs 24   Height of Stairs 6     High Level Balance   High Level Balance Activities Marching forwards;Marching backwards;Side stepping   High Level Balance Comments Side-steps with red theraband and mini squat for first set, 2nd set w/out the theraband     Knee/Hip Exercises: Aerobic   Nustep Level 5 for 6 min. seat position 7.      Knee/Hip Exercises: Machines for Strengthening   Cybex Knee Extension #10 3x10   Cybex Knee Flexion #25 3x10   Cybex Leg Press 20# 3x10  position 4 -pt requested reduced weight due to knee soreness                PT Education - 02/23/17 1750    Education provided Yes   Education Details Delayed onset muscle soreness.    Person(s) Educated Patient   Methods Explanation   Comprehension Verbalized understanding          PT Short Term Goals - 02/09/17 1741      PT SHORT TERM GOAL #1  Title independent with HEP   Time 1   Period Weeks   Status Achieved           PT Long Term Goals - 02/21/17 1701      PT LONG TERM GOAL #1   Title report 50% easier to get in and out of car, at eval she was using her arms to lift legs into the car   Time 8   Period Weeks   Status Achieved     PT LONG TERM GOAL #2   Title decrease TUG time to 20 seconds   Time 8   Period Weeks   Status Achieved     PT LONG TERM GOAL #3   Title reports pain decreased 25%   Time 8   Period Weeks   Status Achieved     PT LONG TERM GOAL #4   Title increase AROM of the right knee to 0-110 degrees flexion   Time 8   Period Weeks   Status Partially Met     PT LONG TERM GOAL #5   Title able to do stairs step over step   Time 8   Period Weeks   Status Partially Met               Plan - 02/23/17 1751    Clinical Impression Statement Pt did well again today. She still has some stiffness doing the stairs, but her  strength and quality of movement are improving. She didn't come in using the cane today.    PT Treatment/Interventions ADLs/Self Care Home Management;Cryotherapy;Electrical Stimulation;Functional mobility training;Gait training;Stair training;Therapeutic activities;Therapeutic exercise;Balance training;Neuromuscular re-education;Manual techniques;Patient/family education   PT Next Visit Plan Continue with strengthening and balance exercises. Continue to work on stairs and try hip flexion extercises against the wall or at sink, without weight.    PT Home Exercise Plan Pt is doing knee flexion and extension stretches, and standing knee extercises.       Patient will benefit from skilled therapeutic intervention in order to improve the following deficits and impairments:  Abnormal gait, Decreased activity tolerance, Decreased balance, Decreased mobility, Decreased strength, Increased edema, Pain, Decreased range of motion, Difficulty walking  Visit Diagnosis: Acute pain of right knee  Stiffness of right knee, not elsewhere classified  Difficulty in walking, not elsewhere classified     Problem List Patient Active Problem List   Diagnosis Date Noted  . OA (osteoarthritis) of hip 02/24/2016    Lennart Pall, SPT 02/23/2017, 5:54 PM  [place], [place], [address] Phone: [phone]   Fax:  [phone]  Name: Tina [name] MRN: [id number] Date of Birth: 09-10-1930

## 2017-02-28 ENCOUNTER — Ambulatory Visit: Payer: Medicare Other | Admitting: Physical Therapy

## 2017-02-28 ENCOUNTER — Encounter: Payer: Self-pay | Admitting: Physical Therapy

## 2017-02-28 DIAGNOSIS — R262 Difficulty in walking, not elsewhere classified: Secondary | ICD-10-CM

## 2017-02-28 DIAGNOSIS — M25661 Stiffness of right knee, not elsewhere classified: Secondary | ICD-10-CM

## 2017-02-28 DIAGNOSIS — M25561 Pain in right knee: Secondary | ICD-10-CM

## 2017-02-28 NOTE — Therapy (Signed)
[place], [place], [address] Phone: [phone]   Fax:  [phone]  Physical Therapy Treatment  Patient Details  Name: Tina [name] MRN: [id number] Date of Birth: 1930-08-30 Referring Provider: Wynelle Link  Encounter Date: 02/28/2017      PT End of Session - 02/28/17 1546    Visit Number 10   PT Start Time 8341   PT Stop Time 9622   PT Time Calculation (min) 48 min   Activity Tolerance Patient tolerated treatment well   Behavior During Therapy [facility] for tasks assessed/performed      Past Medical History:  Diagnosis Date  . Canker sore   . Cystocele   . Depression   . Diverticulosis   . Esophageal dysmotility   . Esophageal stricture   . GERD (gastroesophageal reflux disease)   . Heartburn   . Hypertension   . TIA (transient ischemic attack)     Past Surgical History:  Procedure Laterality Date  . APPENDECTOMY    . CATARACT EXTRACTION    . COLON SURGERY    . JOINT REPLACEMENT    . PARTIAL COLECTOMY  09/1991   due to diverticulitis  . SHOULDER SURGERY     right shoulder- removal calcium deposit  . TOTAL HIP ARTHROPLASTY  6/03  . TOTAL HIP ARTHROPLASTY Left 02/24/2016   Procedure: LEFT TOTAL HIP ARTHROPLASTY ANTERIOR APPROACH;  Surgeon: Gaynelle Arabian, MD;  Location: [facility];  Service: Orthopedics;  Laterality: Left;    There were no vitals filed for this visit.      Subjective Assessment - 02/28/17 1530    Subjective Patient reports that she is feeling really pretty good, she reports that her husband is going to have some health issues, she reports that she has been having some increased pain in the knee at time, reports that she feels like she has done too much at times in PT and at home.   Currently in Pain? Yes   Pain Score 3    Pain Location Knee   Pain Orientation Right                         [facility] Adult PT Treatment/Exercise - 02/28/17 0001      Ambulation/Gait   Gait Comments education on walking on uneven surfaces, safety and yard work, went over [employer] for some yardwork     Knee/Hip Exercises: Aerobic   Nustep Level 5 for 7 min. seat position 7.      Knee/Hip Exercises: Machines for Strengthening   Cybex Knee Extension #10 3x10   Cybex Knee Flexion #25 3x10   Cybex Leg Press 20# 3x10                  PT Short Term Goals - 02/09/17 1741      PT SHORT TERM GOAL #1   Title independent with HEP   Time 1   Period Weeks   Status Achieved           PT Long Term Goals - 02/28/17 1546      PT LONG TERM GOAL #1   Title report 50% easier to get in and out of car, at eval she was using her arms to lift legs into the car   Status Achieved     PT LONG TERM GOAL #2   Title decrease TUG time to 20 seconds   Status Achieved  PT LONG TERM GOAL #3   Title reports pain decreased 25%   Status Achieved     PT LONG TERM GOAL #4   Title increase AROM of the right knee to 0-110 degrees flexion   Status Achieved     PT LONG TERM GOAL #5   Title able to do stairs step over step   Status Achieved               Plan - 03/25/2017 1547    Clinical Impression Statement Patient overall is doing very well, she has met the goals that we set for her.  She has some pain with "over doing it".  She has stopped using the cane for gait and is driving again   PT Next Visit Plan D/C with all goals met   Consulted and Agree with Plan of Care Patient      Patient will benefit from skilled therapeutic intervention in order to improve the following deficits and impairments:  Abnormal gait, Decreased activity tolerance, Decreased balance, Decreased mobility, Decreased strength, Increased edema, Pain, Decreased range of motion, Difficulty walking  Visit Diagnosis: Acute pain of right knee  Stiffness of right knee, not elsewhere classified  Difficulty in walking, not elsewhere classified       G-Codes -  2017/03/25 1539    Functional Assessment Tool Used (Outpatient Only) foto 50% limitation   Functional Limitation Mobility: Walking and moving around   Mobility: Walking and Moving Around Current Status [phone]) At least 40 percent but less than 60 percent impaired, limited or restricted   Mobility: Walking and Moving Around Goal Status [phone]) At least 40 percent but less than 60 percent impaired, limited or restricted   Mobility: Walking and Moving Around Discharge Status [phone]) At least 40 percent but less than 60 percent impaired, limited or restricted      Problem List Patient Active Problem List   Diagnosis Date Noted  . OA (osteoarthritis) of hip 02/24/2016    Sumner Boast., PT 03/25/2017, 3:48 PM  [place], [place], [address] Phone: [phone]   Fax:  [phone]  Name: Tina [name] MRN: [id number] Date of Birth: July 31, 1931

## 2017-03-02 ENCOUNTER — Ambulatory Visit: Payer: Medicare Other | Admitting: Physical Therapy

## 2018-04-26 ENCOUNTER — Ambulatory Visit: Payer: Medicare Other | Attending: Pediatrics | Admitting: Physical Therapy

## 2018-04-26 ENCOUNTER — Encounter: Payer: Self-pay | Admitting: Physical Therapy

## 2018-04-26 DIAGNOSIS — R42 Dizziness and giddiness: Secondary | ICD-10-CM | POA: Insufficient documentation

## 2018-04-26 NOTE — Therapy (Signed)
[place] [place] [address] [place], [place], [address] Phone: [phone]   Fax:  [phone]  Physical Therapy Evaluation  Patient Details  Name: Tina [name] MRN: [id number] Date of Birth: 1930/08/27 Referring Provider (PT): Leinbach   Encounter Date: 04/26/2018  PT End of Session - 04/26/18 1801    Visit Number  1    Date for PT Re-Evaluation  06/26/18    PT Start Time  1645    PT Stop Time  1733    PT Time Calculation (min)  48 min    Activity Tolerance  Patient tolerated treatment well    Behavior During Therapy  [facility] for tasks assessed/performed       Past Medical History:  Diagnosis Date  . Canker sore   . Cystocele   . Depression   . Diverticulosis   . Esophageal dysmotility   . Esophageal stricture   . GERD (gastroesophageal reflux disease)   . Heartburn   . Hypertension   . TIA (transient ischemic attack)     Past Surgical History:  Procedure Laterality Date  . APPENDECTOMY    . CATARACT EXTRACTION    . COLON SURGERY    . JOINT REPLACEMENT    . PARTIAL COLECTOMY  09/1991   due to diverticulitis  . SHOULDER SURGERY     right shoulder- removal calcium deposit  . TOTAL HIP ARTHROPLASTY  6/03  . TOTAL HIP ARTHROPLASTY Left 02/24/2016   Procedure: LEFT TOTAL HIP ARTHROPLASTY ANTERIOR APPROACH;  Surgeon: [name], MD;  Location: [facility];  Service: Orthopedics;  Laterality: Left;    There were no vitals filed for this visit.   Subjective Assessment - 04/26/18 1700    Subjective  Patient reports that she has had dizziness in the past, she reports that she had some UTI and stress issues lately.  She reports that about a month ago she became unsteady with walking.  She reports tthat her "eyes felt jumpy", reports at times the room would spin.  Reports that in the past the dizziness would go away in a day or two.  This one has persisted even though she reports that it is a little better.    Limitations  Walking;House hold activities    Patient Stated Goals  stop the dizziness and feel safer with walking    Currently in Pain?  No/denies         [facility] PT Assessment - 04/26/18 0001      Assessment   Medical Diagnosis  dizziness, BPPV    Referring Provider (PT)  Leinbach    Onset Date/Surgical Date  04/05/18    Prior Therapy  no      Precautions   Precautions  None      Balance Screen   Has the patient fallen in the past 6 months  No    Has the patient had a decrease in activity level because of a fear of falling?   Yes    Is the patient reluctant to leave their home because of a fear of falling?   No      Home Environment   Additional Comments  few steps into home, does housework, yardwork      Prior Function   Level of Independence  Independent    Vocation  Retired    Leisure  no exercise      Posture/Postural Control   Posture Comments  fwd head, rounded shoulders  Vestibular Assessment - 04/26/18 0001      Symptom Behavior   Type of Dizziness  "World moves"    Frequency of Dizziness  daily over the past month    Duration of Dizziness  has lasted for "weeks"      Aggravating Factors  Looking up to the ceiling;Moving eyes;Turning body quickly    Relieving Factors  Dark room;Closing eyes      Occulomotor Exam   Occulomotor Alignment  Normal    Smooth Pursuits  Intact    Saccades  Intact          Objective measurements completed on examination: See above findings.       Vestibular Treatment/Exercise - 04/26/18 0001      Vestibular Treatment/Exercise   Gaze Exercises  X1 Viewing Horizontal;X1 Viewing Vertical;X2 Viewing Horizontal;X2 Viewing Vertical            PT Education - 04/26/18 1801    Education Details  gave VOR gaze stabilization exercises for habituation    Person(s) Educated  Patient    Methods  Explanation;Demonstration;Handout    Comprehension  Verbalized understanding       PT Short Term Goals -  04/26/18 1805      PT SHORT TERM GOAL #1   Title  independent with HEP    Time  2    Period  Weeks    Status  New        PT Long Term Goals - 04/26/18 1805      PT LONG TERM GOAL #1   Title  report 50% less dizziness    Time  8    Period  Weeks    Status  New      PT LONG TERM GOAL #2   Title  indepdnent with advanced Vertigo exercises that include Austin Miles             Plan - 04/26/18 1802    Clinical Impression Statement  Patient reports a hx of dizziness, reports that 3 weeks ago she had an onset that included spinnning room and light headedness and eyes "jumping".  She reports that she has had it coming and going.  She reports that the last few days have been better.  She reports that at the MD office the left side was the problematic side.  I explained the Epley maneuver to her and how I would perform it. She decided that since she was feeling better she did not want to try today.  I gave her VOR stabilization exercises.    Clinical Presentation  Evolving    Clinical Decision Making  Low    Rehab Potential  Good    PT Frequency  2x / week    PT Duration  8 weeks    PT Treatment/Interventions  ADLs/Self Care Home Management;Canalith Repostioning;Therapeutic exercise;Balance training;Neuromuscular re-education;Patient/family education    PT Next Visit Plan  hold treatment, she is to do HEP and call if her dizziness returns    Consulted and Agree with Plan of Care  Patient       Patient will benefit from skilled therapeutic intervention in order to improve the following deficits and impairments:  Difficulty walking, Decreased coordination, Decreased activity tolerance, Decreased balance, Dizziness  Visit Diagnosis: Dizziness and giddiness - Plan: PT plan of care cert/re-cert     Problem List Patient Active Problem List   Diagnosis Date Noted  . OA (osteoarthritis) of hip 02/24/2016    Jearld Lesch., PT 04/26/2018, 6:07 PM  [facility] [place] [facility] [address] [place], [place], [address] Phone: [phone]   Fax:  [phone]  Name: Tina [name] MRN: [id number] Date of Birth: 01-09-1931

## 2018-04-27 ENCOUNTER — Ambulatory Visit: Payer: Medicare Other | Admitting: Physical Therapy

## 2019-01-29 ENCOUNTER — Emergency Department (HOSPITAL_COMMUNITY): Payer: Medicare Other

## 2019-01-29 ENCOUNTER — Encounter (HOSPITAL_COMMUNITY): Payer: Self-pay | Admitting: Emergency Medicine

## 2019-01-29 ENCOUNTER — Emergency Department (HOSPITAL_COMMUNITY)
Admission: EM | Admit: 2019-01-29 | Discharge: 2019-01-29 | Disposition: A | Discharge status: Home / Self Care | Payer: Medicare Other | Attending: Emergency Medicine | Admitting: Emergency Medicine

## 2019-01-29 DIAGNOSIS — Z79899 Other long term (current) drug therapy: Secondary | ICD-10-CM | POA: Diagnosis not present

## 2019-01-29 DIAGNOSIS — S0990XA Unspecified injury of head, initial encounter: Secondary | ICD-10-CM | POA: Diagnosis not present

## 2019-01-29 DIAGNOSIS — S52612A Displaced fracture of left ulna styloid process, initial encounter for closed fracture: Secondary | ICD-10-CM | POA: Insufficient documentation

## 2019-01-29 DIAGNOSIS — Y999 Unspecified external cause status: Secondary | ICD-10-CM | POA: Diagnosis not present

## 2019-01-29 DIAGNOSIS — I10 Essential (primary) hypertension: Secondary | ICD-10-CM | POA: Diagnosis not present

## 2019-01-29 DIAGNOSIS — Y93H2 Activity, gardening and landscaping: Secondary | ICD-10-CM | POA: Insufficient documentation

## 2019-01-29 DIAGNOSIS — Z8673 Personal history of transient ischemic attack (TIA), and cerebral infarction without residual deficits: Secondary | ICD-10-CM | POA: Insufficient documentation

## 2019-01-29 DIAGNOSIS — S52502A Unspecified fracture of the lower end of left radius, initial encounter for closed fracture: Secondary | ICD-10-CM | POA: Diagnosis not present

## 2019-01-29 DIAGNOSIS — T07XXXA Unspecified multiple injuries, initial encounter: Secondary | ICD-10-CM | POA: Diagnosis present

## 2019-01-29 DIAGNOSIS — Y92017 Garden or yard in single-family (private) house as the place of occurrence of the external cause: Secondary | ICD-10-CM | POA: Insufficient documentation

## 2019-01-29 DIAGNOSIS — W1830XA Fall on same level, unspecified, initial encounter: Secondary | ICD-10-CM | POA: Diagnosis not present

## 2019-01-29 MED ORDER — HYDROCODONE-ACETAMINOPHEN 5-325 MG PO TABS: 1.0000 | ORAL_TABLET | ORAL | 0 refills | Status: DC | PRN

## 2019-01-29 MED ORDER — HYDROCODONE-ACETAMINOPHEN 5-325 MG PO TABS
2.0000 | ORAL_TABLET | Freq: Once | ORAL | Status: AC
  Administered 2019-01-29: 21:00:00 2 via ORAL
  Filled 2019-01-29: qty 2

## 2019-01-29 NOTE — ED Notes (Addendum)
Pt verbalized discharge instructions and follow up care. Ambulatory and alert. No IV. Family picking pt up

## 2019-01-29 NOTE — ED Notes (Signed)
Attempted to call family x3 without an answer.

## 2019-01-29 NOTE — ED Notes (Signed)
Spoke with Butch Penny (daughter) to let family know when they arrive to pick up pt

## 2019-01-29 NOTE — Discharge Instructions (Signed)
Follow-up with the orthopedics clinic later this week for your wrist fracture. Talk to your primary care provider for follow-up in the clinic to have an outpatient chest CT to evaluate your left lung. Keep arm elevated and take pain medicine, 1 or 2 tablets every 4-6 hours as needed for severe pain. Do not take Tylenol with the pain medication.  Take MiraLAX if needed for constipation. Turn to ER if you have any confusion, balance problems, vomiting, lethargy, or problems with numbness or coldness of your left hand.

## 2019-01-29 NOTE — ED Notes (Signed)
Provider at bedside speaking with pt.

## 2019-01-29 NOTE — ED Notes (Signed)
Patient transported to CT 

## 2019-01-29 NOTE — ED Provider Notes (Signed)
[place] [facility]   CSN: [id number] Arrival date & time: 01/29/19  1626    History   Chief Complaint Chief Complaint  Patient presents with   Fall    HPI Tina [name] is a 85 y.o. female.     85 year old female with past medical history below who presents with fall.  Just prior to arrival, the patient was working in her yard when she stepped backwards and tripped over a pipe, causing her to fall back and hit the back of her head.  Her left wrist got caught up underneath her and she has had left wrist pain ever since then.  She denies any neck, chest, abdominal, hip, or other extremity pain.  No anticoagulant use.  She states she is up-to-date on vaccinations.  Her wrist pain is moderate to severe and constant.  Normal sensation in fingers.  The history is provided by the patient.  Fall    Past Medical History:  Diagnosis Date   Canker sore    Cystocele    Depression    Diverticulosis    Esophageal dysmotility    Esophageal stricture    GERD (gastroesophageal reflux disease)    Heartburn    Hypertension    TIA (transient ischemic attack)     Patient Active Problem List   Diagnosis Date Noted   OA (osteoarthritis) of hip 02/24/2016    Past Surgical History:  Procedure Laterality Date   APPENDECTOMY     CATARACT EXTRACTION     COLON SURGERY     JOINT REPLACEMENT     PARTIAL COLECTOMY  09/1991   due to diverticulitis   SHOULDER SURGERY     right shoulder- removal calcium deposit   TOTAL HIP ARTHROPLASTY  6/03   TOTAL HIP ARTHROPLASTY Left 02/24/2016   Procedure: LEFT TOTAL HIP ARTHROPLASTY ANTERIOR APPROACH;  Surgeon: [name], MD;  Location: [facility];  Service: Orthopedics;  Laterality: Left;     OB History   No obstetric history on file.      Home Medications    Prior to Admission medications   Medication Sig Start Date End Date Taking? Authorizing Provider  acetaminophen (TYLENOL)  500 MG tablet Take 500 mg by mouth every 6 (six) hours as needed for mild pain, moderate pain, fever or headache.    [provider]  alendronate (FOSAMAX) 70 MG tablet Take 70 mg by mouth every Sunday. Take with a full glass of water on an empty stomach.    [provider]  Biotin 1 MG CAPS Take 1 mg by mouth daily.    [provider]  Calcium Carbonate-Vitamin D (CALCIUM 600+D) 600-400 MG-UNIT tablet Take 1 tablet by mouth daily.    [provider]  cholecalciferol (VITAMIN D) 1000 units tablet Take 1,000 Units by mouth every other day.    [provider]  citalopram (CELEXA) 20 MG tablet Take 20 mg by mouth daily.      [provider]  docusate sodium (COLACE) 100 MG capsule Take 1 capsule (100 mg total) by mouth every 12 (twelve) hours. 11/03/16   Long, Arlyss Repress, MD  guaiFENesin-dextromethorphan (ROBITUSSIN DM) 100-10 MG/5ML syrup Take 5 mLs by mouth every 4 (four) hours as needed for cough.    [provider]  HYDROcodone-acetaminophen (NORCO/VICODIN) 5-325 MG tablet Take 1 tablet by mouth every 4 (four) hours as needed for moderate pain or severe pain. 01/29/19   Anderia Lorenzo, Ambrose Finland, MD  HYDROcodone-homatropine [facility]) 5-1.5  MG/5ML syrup Take 5 mLs by mouth every 6 (six) hours as needed for cough.    [provider]  polyethylene glycol (MIRALAX / GLYCOLAX) packet Take 17 g by mouth daily as needed for mild constipation.    [provider]  ranitidine (ZANTAC) 150 MG tablet Take 150 mg by mouth 2 (two) times daily.    [provider]  triamcinolone (KENALOG) 0.1 % paste Use as directed 1 application in the mouth or throat 2 (two) times daily as needed (for oral lesions).    [provider]  zolpidem (AMBIEN) 5 MG tablet Take 2.5 mg by mouth at bedtime as needed for sleep.     [provider]    Family History Family History  Problem Relation Age of Onset   Cancer Paternal Aunt     Cancer Paternal Uncle    Diabetes Father    Heart disease Father    Cancer Father    Diabetes Sister    Heart disease Sister    Leukemia Sister    Heart disease Brother     Social History Social History   Tobacco Use   Smoking status: Former Smoker    Quit date: 08/02/1963    Years since quitting: 55.5   Smokeless tobacco: Never Used  Substance Use Topics   Alcohol use: No   Drug use: No     Allergies   Valdecoxib and Prednisone   Review of Systems Review of Systems All other systems reviewed and are negative except that which was mentioned in HPI   Physical Exam Updated Vital Signs BP (!) 147/79 (BP Location: Right Arm)    Pulse 75    Temp 98.3 F (36.8 C) (Oral)    Resp 15    SpO2 96%   Physical Exam Vitals signs and nursing note reviewed.  Constitutional:      General: She is not in acute distress.    Appearance: She is well-developed.     Comments: Awake, alert  HENT:     Head: Normocephalic.     Comments: Tender on occipital scalp with no abrasion or large hematoma    Nose: Nose normal.     Mouth/Throat:     Mouth: Mucous membranes are moist.     Pharynx: Oropharynx is clear.  Eyes:     Extraocular Movements: Extraocular movements intact.     Conjunctiva/sclera: Conjunctivae normal.     Pupils: Pupils are equal, round, and reactive to light.  Neck:     Comments: In c-collar Cardiovascular:     Rate and Rhythm: Normal rate and regular rhythm.     Heart sounds: Normal heart sounds. No murmur.  Pulmonary:     Effort: Pulmonary effort is normal. No respiratory distress.     Breath sounds: Normal breath sounds.  Chest:     Chest wall: No tenderness.  Abdominal:     General: Bowel sounds are normal. There is no distension.     Palpations: Abdomen is soft.     Tenderness: There is no abdominal tenderness.  Musculoskeletal:        General: Swelling and tenderness present.     Comments: Edema and tenderness L wrist, 2+ radial pulse, normal  sensation fingers, no tenderness at L elbow or shoulder Normal ROM RUE, BLE  Skin:    General: Skin is warm and dry.  Neurological:     Mental Status: She is alert and oriented to person, place, and time.     Cranial  Nerves: No cranial nerve deficit.     Sensory: No sensory deficit.     Motor: No abnormal muscle tone.     Deep Tendon Reflexes: Reflexes are normal and symmetric.     Comments: Fluent speech, normal finger-to-nose testing, negative pronator drift, no clonus 5/5 strength and normal sensation x all 4 extremities  Psychiatric:        Thought Content: Thought content normal.        Judgment: Judgment normal.      ED Treatments / Results  Labs (all labs ordered are listed, but only abnormal results are displayed) Labs Reviewed - No data to display  EKG None  Radiology Dg Forearm Left  Result Date: 01/29/2019 CLINICAL DATA:  Arm pain EXAM: LEFT FOREARM - 2 VIEW COMPARISON:  None. FINDINGS: Proximal radius and ulna appear intact. Acute mildly comminuted ulnar styloid process fracture. Acute comminuted slightly impacted intra-articular distal radius fracture with mild dorsal displacement and angulation. IMPRESSION: 1. Acute mildly comminuted ulnar styloid process fracture 2. Acute comminuted intra-articular distal radius fracture Electronically Signed   By: Donavan Foil M.D.   On: 01/29/2019 19:06   Dg Wrist Complete Left  Result Date: 01/29/2019 CLINICAL DATA:  Wrist injury EXAM: LEFT WRIST - COMPLETE 3+ VIEW COMPARISON:  None. FINDINGS: Acute mildly comminuted ulnar styloid process fracture. Acute comminuted, impacted intra-articular distal radius fracture with mild dorsal angulation of distal fracture fragment. Soft tissue swelling is present. Arthritis at the first [facility] joint. IMPRESSION: 1. Acute comminuted, impacted and mildly angulated intra-articular distal radius fracture 2. Acute mildly comminuted ulnar styloid process fracture Electronically Signed   By: Donavan Foil M.D.   On: 01/29/2019 19:05   Ct Head Wo Contrast  Result Date: 01/29/2019 CLINICAL DATA:  Tripped and fell backwards striking the head. EXAM: CT HEAD WITHOUT CONTRAST CT CERVICAL SPINE WITHOUT CONTRAST TECHNIQUE: Multidetector CT imaging of the head and cervical spine was performed following the standard protocol without intravenous contrast. Multiplanar CT image reconstructions of the cervical spine were also generated. COMPARISON:  02/14/2017. Report from head CT dated 07/03/2014. Actual images not present. FINDINGS: CT HEAD FINDINGS Brain: Age related volume loss. Minimal small vessel change of the white matter. No sign of acute infarction, mass lesion, hemorrhage, hydrocephalus or extra-axial collection. Vascular: There is atherosclerotic calcification of the major vessels at the base of the brain. Skull: Negative Sinuses/Orbits: Clear/normal Other: None CT CERVICAL SPINE FINDINGS Alignment: No traumatic malalignment. Skull base and vertebrae: No fracture. Ankylosis the skull base and C1. In persons of this age. 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 Soft tissues and spinal canal: Negative Disc levels: Mild spondylosis from C3-4 through C6-7 with disc space narrowing and small osteophytes. Mild facet osteoarthritis on the left at C2-3. Upper chest: 2 cm region of hazy opacity in the left upper lobe. I would suggest a complete chest CT to evaluate this further as its was not described on the previous study. The differential diagnosis would include sub solid nodule versus inflammatory change. This can be done electively. Other: None IMPRESSION: Head CT: No acute or traumatic finding. Age related atrophy and mild chronic small-vessel change. Cervical spine CT: No acute or traumatic finding. 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 2 cm region of hazy opacity in the left upper lobe. This was not described on a previous chest CT. Differential diagnosis is scarring, inflammatory change and sub solid  nodule. I would suggest an elective chest CT be performed to fully evaluate the  lungs. Electronically Signed   By: Paulina FusiMark  Shogry M.D.   On: 01/29/2019 19:29   Ct Cervical Spine Wo Contrast  Result Date: 01/29/2019 CLINICAL DATA:  Tripped and fell backwards striking the head. EXAM: CT HEAD WITHOUT CONTRAST CT CERVICAL SPINE WITHOUT CONTRAST TECHNIQUE: Multidetector CT imaging of the head and cervical spine was performed following the standard protocol without intravenous contrast. Multiplanar CT image reconstructions of the cervical spine were also generated. COMPARISON:  02/14/2017. Report from head CT dated 07/03/2014. Actual images not present. FINDINGS: CT HEAD FINDINGS Brain: Age related volume loss. Minimal small vessel change of the white matter. No sign of acute infarction, mass lesion, hemorrhage, hydrocephalus or extra-axial collection. Vascular: There is atherosclerotic calcification of the major vessels at the base of the brain. Skull: Negative Sinuses/Orbits: Clear/normal Other: None CT CERVICAL SPINE FINDINGS Alignment: No traumatic malalignment. Skull base and vertebrae: No fracture. Ankylosis the skull base and C1. In persons of this age. 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 Soft tissues and spinal canal: Negative Disc levels: Mild spondylosis from C3-4 through C6-7 with disc space narrowing and small osteophytes. Mild facet osteoarthritis on the left at C2-3. Upper chest: 2 cm region of hazy opacity in the left upper lobe. I would suggest a complete chest CT to evaluate this further as its was not described on the previous study. The differential diagnosis would include sub solid nodule versus inflammatory change. This can be done electively. Other: None IMPRESSION: Head CT: No acute or traumatic finding. Age related atrophy and mild chronic small-vessel change. Cervical spine CT: No acute or traumatic finding. 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 2 cm region of hazy opacity in the left upper  lobe. This was not described on a previous chest CT. Differential diagnosis is scarring, inflammatory change and sub solid nodule. I would suggest an elective chest CT be performed to fully evaluate the lungs. Electronically Signed   By: Paulina FusiMark  Shogry M.D.   On: 01/29/2019 19:29    Procedures Procedures (including critical care time)  Medications Ordered in ED Medications  HYDROcodone-acetaminophen (NORCO/VICODIN) 5-325 MG per tablet 2 tablet (2 tablets Oral Given 01/29/19 2058)     Initial Impression / Assessment and Plan / ED Course  I have reviewed the triage vital signs and the nursing notes.  Pertinent labs & imaging results that were available during my care of the patient were reviewed by me and considered in my medical decision making (see chart for details).        Neurologically intact on exam, GCS 15.  CT of head and C-spine negative for acute injury however she does have incidental finding of haziness in left upper lobe with recommendation of outpatient chest CT.  I discussed this finding with the patient and need for PCP follow-up.  Plain films show impacted, angulated left distal radius fracture and ulnar styloid fracture.  Discussed with hand surgeon on-call, Dr. Roney Mansreighton, recommended sugar tong splint and follow-up in clinic.  I have discussed this plan with the patient.  Provided with pain medication to use as needed.  I discussed her treatment plan, follow-up, and return precautions with both the patient and her daughter-in-law over the phone.  They voiced understanding.  Final Clinical Impressions(s) / ED Diagnoses   Final diagnoses:  Closed fracture of distal end of left radius, unspecified fracture morphology, initial encounter  Traumatic closed fracture of ulnar styloid with minimal displacement, left, initial encounter  Injury of head, initial encounter  ED Discharge Orders         Ordered    HYDROcodone-acetaminophen (NORCO/VICODIN) 5-325 MG tablet  Every 4  hours PRN     01/29/19 2154           Elodia Haviland, Ambrose Finlandachel Morgan, MD 01/29/19 2311

## 2019-01-29 NOTE — ED Notes (Signed)
Ortho tech at bedside and pt given crackers with medication

## 2019-01-29 NOTE — ED Notes (Signed)
C-collar removed with verbal from [place], MD

## 2019-01-29 NOTE — ED Triage Notes (Signed)
Per EMS-states she was doing yard work when she stepped backwards and tripped over some pipes-fell injuring left wrist, hitting head-no LOC, no blood thinners, no dizziness

## 2019-01-29 NOTE — ED Notes (Signed)
Ortho tech at bedside 

## 2019-01-29 NOTE — ED Notes (Signed)
Daughter Leanne Lovely [phone] call for updates-husband is [facility]

## 2019-01-29 NOTE — ED Notes (Signed)
Ortho called for pt for splint.

## 2019-01-29 NOTE — ED Notes (Signed)
Spoke to patients husband and updated on plan of care.   Patient gave permission to give information to husband and "Peggy".

## 2019-08-15 ENCOUNTER — Telehealth: Payer: Self-pay

## 2019-08-15 DIAGNOSIS — R0602 Shortness of breath: Secondary | ICD-10-CM

## 2019-08-15 NOTE — Telephone Encounter (Signed)
Hello can you confirm if you did indeed need a CT scan for her. It was my understanding that she wanted you to review a scan that she had recently and to get established with you. Please advise if you would like for [place] to place a CT order for her as well.

## 2019-08-16 NOTE — Telephone Encounter (Signed)
Tina [name],  Yes please place a order for a non-contrasted ct chest prior to be seeing her in the office. I will review with her in the office that day.   Thanks  BLI

## 2019-08-16 NOTE — Telephone Encounter (Signed)
Per Dr. Tonia Brooms a CT of the chest without contrast has been ordered for patient's consult in the office in Feb.

## 2019-08-20 ENCOUNTER — Ambulatory Visit (INDEPENDENT_AMBULATORY_CARE_PROVIDER_SITE_OTHER)
Admission: RE | Admit: 2019-08-20 | Discharge: 2019-08-20 | Disposition: A | Discharge status: Home / Self Care | Payer: Medicare Other | Source: Ambulatory Visit | Attending: Pulmonary Disease | Admitting: Pulmonary Disease

## 2019-08-20 ENCOUNTER — Other Ambulatory Visit: Payer: Self-pay

## 2019-08-20 DIAGNOSIS — R0602 Shortness of breath: Secondary | ICD-10-CM | POA: Diagnosis not present

## 2019-08-27 ENCOUNTER — Ambulatory Visit: Payer: Medicare Other

## 2019-09-05 ENCOUNTER — Encounter: Payer: Self-pay | Admitting: Pulmonary Disease

## 2019-09-05 ENCOUNTER — Ambulatory Visit: Payer: Medicare Other | Attending: Internal Medicine

## 2019-09-05 ENCOUNTER — Other Ambulatory Visit: Payer: Self-pay

## 2019-09-05 ENCOUNTER — Ambulatory Visit: Payer: Medicare Other | Admitting: Pulmonary Disease

## 2019-09-05 VITALS — BP 122/78 | HR 62 | Temp 97.1°F | Ht 64.0 in | Wt 153.0 lb

## 2019-09-05 DIAGNOSIS — Z23 Encounter for immunization: Secondary | ICD-10-CM

## 2019-09-05 DIAGNOSIS — R918 Other nonspecific abnormal finding of lung field: Secondary | ICD-10-CM | POA: Diagnosis not present

## 2019-09-05 DIAGNOSIS — R05 Cough: Secondary | ICD-10-CM | POA: Diagnosis not present

## 2019-09-05 DIAGNOSIS — J849 Interstitial pulmonary disease, unspecified: Secondary | ICD-10-CM | POA: Diagnosis not present

## 2019-09-05 DIAGNOSIS — R059 Cough, unspecified: Secondary | ICD-10-CM

## 2019-09-05 MED ORDER — HYDROCODONE-HOMATROPINE 5-1.5 MG/5ML PO SYRP: 5.0000 mL | ORAL_SOLUTION | Freq: Four times a day (QID) | ORAL | 0 refills | Status: DC | PRN

## 2019-09-05 NOTE — Progress Notes (Signed)
   Covid-19 Vaccination Clinic  Name:  Tina [name]    MRN: [id number] DOB: January 09, 1931  09/05/2019  Ms. Tina [name] was observed post Covid-19 immunization for 15 minutes without incidence. She was provided with Vaccine Information Sheet and instruction to access the V-Safe system.   Ms. Tina [name] was instructed to call 911 with any severe reactions post vaccine: Marland Kitchen Difficulty breathing  . Swelling of your face and throat  . A fast heartbeat  . A bad rash all over your body  . Dizziness and weakness    Immunizations Administered    Name Date Dose VIS Date Route   Pfizer COVID-19 Vaccine 09/05/2019  1:41 PM 0.3 mL 07/12/2019 Intramuscular   Manufacturer: [employer], [employer]   Lot: [id number]   NDC: [address]-5

## 2019-09-05 NOTE — Progress Notes (Signed)
Synopsis: Referred in Feb 2021 for abnormal CT by Tracey Harries, MD  Subjective:   PATIENT ID: Tina [name] DOB: [birth year], MRN: [id number]  Chief Complaint  Patient presents with  . Pulmonary Consult    Self referral for abnormal scan when seen in ER. Patient has repeatr CT scan on 08/20/19.     This is an 86 year old [name] with a past medical history of esophageal dysmotility, have esophageal stricture, gastroesophageal reflux disease, hypertension history of TIA.  Patient was seen back in June had a ER visit with a cervical spine series that showed a upper lobe infiltrate.  She was seen by myself in the office in conjunction with her husband today.  She had follow-up imaging which showed resolution of this upper lobe lesion however there was some abnormalities within the bases consistent with possible start of ILD.  She does have a history of chronic cough is been going on for several years she has seen ENT in the past as well as primary care no one could ever give her an answer as to why she has chronic cough.  Nonproductive and dry.  No sputum production denies hemoptysis.  No significant exposures.  He has not had prior PFTs.     Past Medical History:  Diagnosis Date  . Canker sore   . Cystocele   . Depression   . Diverticulosis   . Esophageal dysmotility   . Esophageal stricture   . GERD (gastroesophageal reflux disease)   . Heartburn   . Hypertension   . TIA (transient ischemic attack)      Family History  Problem Relation Age of Onset  . Cancer Paternal Aunt   . Cancer Paternal Uncle   . Diabetes Father   . Heart disease Father   . Cancer Father   . Diabetes Sister   . Heart disease Sister   . Leukemia Sister   . Heart disease Brother      Past Surgical History:  Procedure Laterality Date  . APPENDECTOMY    . CATARACT EXTRACTION    . COLON SURGERY    . JOINT REPLACEMENT    . PARTIAL COLECTOMY  09/1991   due to diverticulitis  . SHOULDER  SURGERY     right shoulder- removal calcium deposit  . TOTAL HIP ARTHROPLASTY  6/03  . TOTAL HIP ARTHROPLASTY Left 02/24/2016   Procedure: LEFT TOTAL HIP ARTHROPLASTY ANTERIOR APPROACH;  Surgeon: [name], MD;  Location: [facility];  Service: Orthopedics;  Laterality: Left;    Social History   Socioeconomic History  . Marital status: Married    Spouse name: Not on file  . Number of children: Not on file  . Years of education: Not on file  . Highest education level: Not on file  Occupational History  . Not on file  Tobacco Use  . Smoking status: Former Smoker    Packs/day: 0.50    Years: 15.00    Pack years: 7.50    Types: Cigarettes    Quit date: 08/02/1963    Years since quitting: 56.1  . Smokeless tobacco: Never Used  Substance and Sexual Activity  . Alcohol use: No  . Drug use: No  . Sexual activity: Not on file  Other Topics Concern  . Not on file  Social History Narrative  . Not on file   Social Determinants of Health   Financial Resource Strain:   . Difficulty of Paying Living Expenses: Not on file  Food Insecurity:   .  Worried About Programme researcher, broadcasting/film/video in the Last Year: Not on file  . Ran Out of Food in the Last Year: Not on file  Transportation Needs:   . Lack of Transportation (Medical): Not on file  . Lack of Transportation (Non-Medical): Not on file  Physical Activity:   . Days of Exercise per Week: Not on file  . Minutes of Exercise per Session: Not on file  Stress:   . Feeling of Stress : Not on file  Social Connections:   . Frequency of Communication with Friends and Family: Not on file  . Frequency of Social Gatherings with Friends and Family: Not on file  . Attends Religious Services: Not on file  . Active Member of Clubs or Organizations: Not on file  . Attends Banker Meetings: Not on file  . Marital Status: Not on file  Intimate Partner Violence:   . Fear of Current or Ex-Partner: Not on file  . Emotionally Abused: Not on file    . Physically Abused: Not on file  . Sexually Abused: Not on file     Allergies  Allergen Reactions  . Valdecoxib Other (See Comments)    Reaction:  Unknown   . Prednisone Rash     Outpatient Medications Prior to Visit  Medication Sig Dispense Refill  . acetaminophen (TYLENOL) 500 MG tablet Take 500 mg by mouth every 6 (six) hours as needed for mild pain, moderate pain, fever or headache.    . alendronate (FOSAMAX) 70 MG tablet Take 70 mg by mouth every Sunday. Take with a full glass of water on an empty stomach.    Marland Kitchen atorvastatin (LIPITOR) 10 MG tablet Take 10 mg by mouth daily at 6 PM.     . Biotin 1 MG CAPS Take 1 mg by mouth daily.    . Calcium Carbonate-Vitamin D (CALCIUM 600+D) 600-400 MG-UNIT tablet Take 1 tablet by mouth daily.    . cholecalciferol (VITAMIN D) 1000 units tablet Take 1,000 Units by mouth every other day.    . citalopram (CELEXA) 20 MG tablet Take 20 mg by mouth daily.      Marland Kitchen docusate sodium (COLACE) 100 MG capsule Take 1 capsule (100 mg total) by mouth every 12 (twelve) hours. 60 capsule 0  . estradiol (ESTRACE) 0.1 MG/GM vaginal cream Place 1 Applicatorful vaginally as needed.     Marland Kitchen HYDROcodone-acetaminophen (NORCO/VICODIN) 5-325 MG tablet Take 1 tablet by mouth every 4 (four) hours as needed for moderate pain or severe pain. 12 tablet 0  . polyethylene glycol (MIRALAX / GLYCOLAX) packet Take 17 g by mouth daily as needed for mild constipation.    . triamcinolone (KENALOG) 0.1 % paste Use as directed 1 application in the mouth or throat 2 (two) times daily as needed (for oral lesions).    . zolpidem (AMBIEN) 5 MG tablet Take 2.5 mg by mouth at bedtime as needed for sleep.     Marland Kitchen HYDROcodone-homatropine (HYCODAN) 5-1.5 MG/5ML syrup Take 5 mLs by mouth every 6 (six) hours as needed for cough.    Marland Kitchen guaiFENesin-dextromethorphan (ROBITUSSIN DM) 100-10 MG/5ML syrup Take 5 mLs by mouth every 4 (four) hours as needed for cough.    . ranitidine (ZANTAC) 150 MG tablet Take  150 mg by mouth 2 (two) times daily.     No facility-administered medications prior to visit.    Review of Systems  Constitutional: Negative for chills, fever, malaise/fatigue and weight loss.  HENT: Negative for hearing loss, sore throat  and tinnitus.   Eyes: Negative for blurred vision and double vision.  Respiratory: Positive for cough. Negative for hemoptysis, sputum production, shortness of breath, wheezing and stridor.   Cardiovascular: Negative for chest pain, palpitations, orthopnea, leg swelling and PND.  Gastrointestinal: Negative for abdominal pain, constipation, diarrhea, heartburn, nausea and vomiting.  Genitourinary: Negative for dysuria, hematuria and urgency.  Musculoskeletal: Negative for joint pain and myalgias.  Skin: Negative for itching and rash.  Neurological: Negative for dizziness, tingling, weakness and headaches.  Endo/Heme/Allergies: Negative for environmental allergies. Does not bruise/bleed easily.  Psychiatric/Behavioral: Negative for depression. The patient is not nervous/anxious and does not have insomnia.   All other systems reviewed and are negative.    Objective:  Physical Exam Vitals reviewed.  Constitutional:      General: She is not in acute distress.    Appearance: She is well-developed.  HENT:     Head: Normocephalic and atraumatic.  Eyes:     General: No scleral icterus.    Conjunctiva/sclera: Conjunctivae normal.     Pupils: Pupils are equal, round, and reactive to light.  Neck:     Vascular: No JVD.     Trachea: No tracheal deviation.  Cardiovascular:     Rate and Rhythm: Normal rate and regular rhythm.     Heart sounds: Normal heart sounds. No murmur.  Pulmonary:     Effort: Pulmonary effort is normal. No tachypnea, accessory muscle usage or respiratory distress.     Breath sounds: Normal breath sounds. No stridor. No wheezing, rhonchi or rales.     Comments: No crackles Abdominal:     General: Bowel sounds are normal. There is  no distension.     Palpations: Abdomen is soft.     Tenderness: There is no abdominal tenderness.  Musculoskeletal:        General: No tenderness.     Cervical back: Neck supple.  Lymphadenopathy:     Cervical: No cervical adenopathy.  Skin:    General: Skin is warm and dry.     Capillary Refill: Capillary refill takes less than 2 seconds.     Findings: No rash.  Neurological:     Mental Status: She is alert and oriented to person, place, and time.  Psychiatric:        Behavior: Behavior normal.      Vitals:   09/05/19 1106  BP: 122/78  Pulse: 62  Temp: (!) 97.1 F (36.2 C)  TempSrc: Temporal  SpO2: 96%  Weight: 153 lb (69.4 kg)  Height: 5\' 4"  (1.626 m)   96% on RA BMI Readings from Last 3 Encounters:  09/05/19 26.26 kg/m  11/03/16 26.43 kg/m  02/24/16 28.17 kg/m   Wt Readings from Last 3 Encounters:  09/05/19 153 lb (69.4 kg)  11/03/16 154 lb (69.9 kg)  02/24/16 154 lb (69.9 kg)     CBC    Component Value Date/Time   WBC 8.9 11/03/2016 1743   RBC 3.85 (L) 11/03/2016 1743   HGB 11.2 (L) 11/03/2016 1743   HCT 33.7 (L) 11/03/2016 1743   PLT 230 11/03/2016 1743   MCV 87.5 11/03/2016 1743   MCH 29.1 11/03/2016 1743   MCHC 33.2 11/03/2016 1743   RDW 13.8 11/03/2016 1743   LYMPHSABS 2.9 11/03/2016 1743   MONOABS 0.8 11/03/2016 1743   EOSABS 0.1 11/03/2016 1743   BASOSABS 0.0 11/03/2016 1743     Chest Imaging: 08/20/2019: CT chest without contrast Upper lobe infiltrate resolved.  Groundglass area resolved.  She has  minimal patchy subpleural reticulation and groundglass opacities within both lower lobe of the lung no evidence of bronchiectasis or honeycombing.  This could be related to postinfection inflammatory scarring however there is onset of a new interstitial lung day disease could be possible.  The patient's images have been independently reviewed by me.    Pulmonary Functions Testing Results: No flowsheet data found.     Assessment & Plan:      ICD-10-CM   1. Cough  R05 Pulmonary Function Test  2. ILD (interstitial lung disease) (HCC)  J84.9 Pulmonary Function Test  3. Abnormal CT scan, lung  R91.8     Discussion: This is an 86 year old [name] history of cough for the past several years dry nonproductive.  GGO of the upper lobe found on cervical spine series has resolved this was the reason for initial follow-up.  However on this new noncontrasted CT of the chest she has evidence of subpleural reticulation within the lower lobes bilaterally a small areas of groundglass.  Question concern of initial start of possible ILD  Assessment:   Chronic cough, abnormal CT imaging, possible ILD  Plan Following Extensive Data Review & Interpretation:  . I reviewed prior external note(s) from ER visit June 2020, primary care office visit, Zoe Lan, NP June 2020 . I reviewed the result(s) of 2018 labs in care everywhere, CBC CMP normal, CT scan results from January 2021 . I have ordered full pulmonary function test, referral to ILD clinic, prescription for cough syrup, Hycodan  Independent interpretation of tests . Review of patient's noncontrasted CT image January 2021 images revealed areas of subpleural groundglass and reticulation concerning for possible ILD resolution of upper lobe groundglass opacity seen on previous imaging from cervical spine in June 2020. The patient's images have been independently reviewed by me.       Current Outpatient Medications:  .  acetaminophen (TYLENOL) 500 MG tablet, Take 500 mg by mouth every 6 (six) hours as needed for mild pain, moderate pain, fever or headache., Disp: , Rfl:  .  alendronate (FOSAMAX) 70 MG tablet, Take 70 mg by mouth every Sunday. Take with a full glass of water on an empty stomach., Disp: , Rfl:  .  atorvastatin (LIPITOR) 10 MG tablet, Take 10 mg by mouth daily at 6 PM. , Disp: , Rfl:  .  Biotin 1 MG CAPS, Take 1 mg by mouth daily., Disp: , Rfl:  .  Calcium Carbonate-Vitamin D  (CALCIUM 600+D) 600-400 MG-UNIT tablet, Take 1 tablet by mouth daily., Disp: , Rfl:  .  cholecalciferol (VITAMIN D) 1000 units tablet, Take 1,000 Units by mouth every other day., Disp: , Rfl:  .  citalopram (CELEXA) 20 MG tablet, Take 20 mg by mouth daily.  , Disp: , Rfl:  .  docusate sodium (COLACE) 100 MG capsule, Take 1 capsule (100 mg total) by mouth every 12 (twelve) hours., Disp: 60 capsule, Rfl: 0 .  estradiol (ESTRACE) 0.1 MG/GM vaginal cream, Place 1 Applicatorful vaginally as needed. , Disp: , Rfl:  .  HYDROcodone-acetaminophen (NORCO/VICODIN) 5-325 MG tablet, Take 1 tablet by mouth every 4 (four) hours as needed for moderate pain or severe pain., Disp: 12 tablet, Rfl: 0 .  HYDROcodone-homatropine (HYCODAN) 5-1.5 MG/5ML syrup, Take 5 mLs by mouth every 6 (six) hours as needed for cough., Disp: 473 mL, Rfl: 0 .  polyethylene glycol (MIRALAX / GLYCOLAX) packet, Take 17 g by mouth daily as needed for mild constipation., Disp: , Rfl:  .  triamcinolone (KENALOG)  0.1 % paste, Use as directed 1 application in the mouth or throat 2 (two) times daily as needed (for oral lesions)., Disp: , Rfl:  .  zolpidem (AMBIEN) 5 MG tablet, Take 2.5 mg by mouth at bedtime as needed for sleep. , Disp: , Rfl:    Garner Nash, DO [facility] 09/05/2019 11:47 AM

## 2019-09-05 NOTE — Patient Instructions (Addendum)
Thank you for visiting Dr. Tonia Brooms at [facility]. Today we recommend the following:.  Orders Placed This Encounter  Procedures  . Pulmonary Function Test   Meds ordered this encounter  Medications  . HYDROcodone-homatropine (HYCODAN) 5-1.5 MG/5ML syrup    Sig: Take 5 mLs by mouth every 6 (six) hours as needed for cough.    Dispense:  473 mL    Refill:  0   Send with ILD Packet/ Questionaire to be completed by the patient prior to being seen in ILD clinic.  She will need to follow up with PCP for continued refills of cough syrup.   Return in about 4 weeks (around 10/03/2019) for w/ Dr. Isaiah Serge or Marchelle Gearing, [place] Consult for ILD .    Please do your part to reduce the spread of COVID-19.

## 2019-09-17 ENCOUNTER — Ambulatory Visit: Payer: Medicare Other

## 2019-09-30 ENCOUNTER — Ambulatory Visit: Payer: Medicare Other | Attending: Internal Medicine

## 2019-09-30 DIAGNOSIS — Z23 Encounter for immunization: Secondary | ICD-10-CM | POA: Insufficient documentation

## 2019-09-30 NOTE — Progress Notes (Signed)
   Covid-19 Vaccination Clinic  Name:  Tina [name]    MRN: [id number] DOB: 09-22-1930  09/30/2019  [name] was observed post Covid-19 immunization for 15 minutes without incidence. She was provided with Vaccine Information Sheet and instruction to access the V-Safe system.   [name] was instructed to call 911 with any severe reactions post vaccine: Marland Kitchen Difficulty breathing  . Swelling of your face and throat  . A fast heartbeat  . A bad rash all over your body  . Dizziness and weakness    Immunizations Administered    Name Date Dose VIS Date Route   Pfizer COVID-19 Vaccine 09/30/2019  2:38 PM 0.3 mL 07/12/2019 Intramuscular   Manufacturer: [employer], [employer]   Lot: [id number]   NDC: [address]-4

## 2019-10-03 ENCOUNTER — Ambulatory Visit: Payer: Medicare Other | Admitting: Pulmonary Disease

## 2019-10-10 ENCOUNTER — Encounter: Payer: Self-pay | Admitting: Pulmonary Disease

## 2019-10-10 ENCOUNTER — Other Ambulatory Visit: Payer: Self-pay

## 2019-10-10 ENCOUNTER — Ambulatory Visit: Payer: Medicare Other | Admitting: Pulmonary Disease

## 2019-10-10 VITALS — BP 120/62 | HR 68 | Temp 97.3°F | Ht 64.0 in | Wt 151.2 lb

## 2019-10-10 DIAGNOSIS — J849 Interstitial pulmonary disease, unspecified: Secondary | ICD-10-CM | POA: Diagnosis not present

## 2019-10-10 NOTE — Progress Notes (Signed)
Tina [name]    [id number]    1930-11-07  Primary Care Physician:Jones, Sherrill Raring, NP  Referring Physician: Bernerd Limbo, MD [address] [place],  [place] [address]  Chief complaint: Consult for interstitial lung disease  HPI: 86 year old with esophageal dysmotility, stricture, GERD, hypertension, TIA. Seen in ER in June 2020 for fall with CT head, spine series showing upper lobe infiltrate.  She had a follow-up CT here in January 2021 which showed resolution of the infiltrate.  However there is mild interstitial changes at the bases and has been referred here for further evaluation.  Has mild dyspnea on exertion, denies any cough, sputum production, fevers, chills.  Has occasional difficulty swallowing, choking on food and liquids.  Has joint stiffness, dry mouth, eyes.  No Raynaud's phenomena. History notable for esophageal dysmotility, stricture s/p dilatation.  She has previously seen Dr. Olevia Perches at [place] but has not followed up since 2012.  Pets: No pets Occupation: Housewife ILD questionnaire 10/11/2018: Notes occasional mold in the shower.  No hot tub, Jacuzzi, humidifier.  She does have a down pillow.   Smoking history: 8-pack-year smoker.  Quit in 1965. Travel history: No significant travel Relevant family history: No significant family history of lung disease  Outpatient Encounter Medications as of 10/10/2019  Medication Sig  . acetaminophen (TYLENOL) 500 MG tablet Take 500 mg by mouth every 6 (six) hours as needed for mild pain, moderate pain, fever or headache.  . alendronate (FOSAMAX) 70 MG tablet Take 70 mg by mouth every Sunday. Take with a full glass of water on an empty stomach.  Marland Kitchen atorvastatin (LIPITOR) 10 MG tablet Take 10 mg by mouth daily at 6 PM.   . Biotin 1 MG CAPS Take 1 mg by mouth daily.  . Calcium Carbonate-Vitamin D (CALCIUM 600+D) 600-400 MG-UNIT tablet Take 1 tablet by mouth daily.  . cholecalciferol (VITAMIN D) 1000  units tablet Take 1,000 Units by mouth every other day.  . citalopram (CELEXA) 20 MG tablet Take 20 mg by mouth daily.    Marland Kitchen docusate sodium (COLACE) 100 MG capsule Take 1 capsule (100 mg total) by mouth every 12 (twelve) hours.  Marland Kitchen estradiol (ESTRACE) 0.1 MG/GM vaginal cream Place 1 Applicatorful vaginally as needed.   Marland Kitchen HYDROcodone-homatropine (HYCODAN) 5-1.5 MG/5ML syrup Take 5 mLs by mouth every 6 (six) hours as needed for cough.  . polyethylene glycol (MIRALAX / GLYCOLAX) packet Take 17 g by mouth daily as needed for mild constipation.  . triamcinolone (KENALOG) 0.1 % paste Use as directed 1 application in the mouth or throat 2 (two) times daily as needed (for oral lesions).  . zolpidem (AMBIEN) 5 MG tablet Take 2.5 mg by mouth at bedtime as needed for sleep.   . [DISCONTINUED] HYDROcodone-acetaminophen (NORCO/VICODIN) 5-325 MG tablet Take 1 tablet by mouth every 4 (four) hours as needed for moderate pain or severe pain.   No facility-administered encounter medications on file as of 10/10/2019.   Physical Exam: Blood pressure 120/62, pulse 68, temperature (!) 97.3 F (36.3 C), temperature source Temporal, height 5\' 4"  (1.626 m), weight 151 lb 3.2 oz (68.6 kg), SpO2 96 %. Gen:      No acute distress HEENT:  EOMI, sclera anicteric Neck:     No masses; no thyromegaly Lungs:    Clear to auscultation bilaterally; normal respiratory effort CV:         Regular rate and rhythm; no murmurs Abd:      +  bowel sounds; soft, non-tender; no palpable masses, no distension Ext:    No edema; adequate peripheral perfusion Skin:      Warm and dry; no rash Neuro: alert and oriented x 3 Psych: normal mood and affect  Data Reviewed: Imaging: CT head, C-spine 01/29/2019-2 cm hazy opacity in the left upper lobe. CT chest 08/20/2019-left lobe opacity has resolved.  Minimal subpleural reticulation, groundglass opacities in the lower lungs. I have reviewed the images personally.  Assessment:  Interstitial lung  disease Reviewed CT scan with very minimal changes at the bases which is nonspecific. Could be secondary to GERD, mild aspiration given history of esophageal dysmotility. She also has down exposure in her pillows but CT scan is not suggestive of hypersensitivity pneumonitis. Nevertheless I have asked her to get rid of her down pillow.  Her lung changes are very minimal and does not need any aggressive intervention or work-up such as lung biopsy and we can monitor for now. We will get labs today for basic CTD profile evaluation and schedule pulmonary function test She will need follow-up back with GI as she has not seen them since 2012  Plan/Recommendations: - Serologies for autoimmune connective tissue disease - Get rid of down pillow - Pulmonary function test - Referral to GI.  Chilton Greathouse MD [facility] 10/10/2019, 11:43 AM  CC: Tracey Harries, MD

## 2019-10-10 NOTE — Patient Instructions (Signed)
You had mild inflammation and scarring in the lung.  A condition called interstitial lung disease This may occur from the swallowing problems and acid reflux that you are have Other conditions that can cause this include autoimmune disease, exposures   This is very minimal and I do not feel we need to do anything aggressive about it right now I recommend that you get rid of your down pillows and comforters as further exposures can cause this issue. We will check some blood work today to make sure there are no autoimmune issues. You are already scheduled for lung function test. We will ensure that you have follow-up in GI clinic to reevaluate Follow-up in 3 months.

## 2019-10-14 LAB — HYPERSENSITIVITY PNEUMONITIS
A. Pullulans Abs: NEGATIVE
A.Fumigatus #1 Abs: NEGATIVE
Micropolyspora faeni, IgG: NEGATIVE
Pigeon Serum Abs: NEGATIVE
Thermoact. Saccharii: NEGATIVE
Thermoactinomyces vulgaris, IgG: NEGATIVE

## 2019-10-15 LAB — ANCA SCREEN W REFLEX TITER: ANCA Screen: NEGATIVE

## 2019-10-15 LAB — ANA,IFA RA DIAG PNL W/RFLX TIT/PATN
Anti Nuclear Antibody (ANA): POSITIVE — AB
Cyclic Citrullin Peptide Ab: <16 UNITS
Rheumatoid fact SerPl-aCnc: <14 IU/mL (ref <14)

## 2019-10-15 LAB — ANTI-NUCLEAR AB-TITER (ANA TITER): ANA Titer 1: 1:40 {titer} — ABNORMAL HIGH

## 2019-10-15 LAB — SJOGREN'S SYNDROME ANTIBODS(SSA + SSB)
SSA (Ro) (ENA) Antibody, IgG: <1 AI
SSB (La) (ENA) Antibody, IgG: <1 AI

## 2019-10-15 LAB — ANTI-SCLERODERMA ANTIBODY: Scleroderma (Scl-70) (ENA) Antibody, IgG: <1 AI

## 2019-10-18 ENCOUNTER — Encounter: Payer: Self-pay | Admitting: Gastroenterology

## 2019-10-18 ENCOUNTER — Telehealth: Payer: Self-pay

## 2019-10-18 ENCOUNTER — Telehealth: Payer: Self-pay | Admitting: Gastroenterology

## 2019-10-18 NOTE — Telephone Encounter (Signed)
Consult scheduled on 11/25/19 at 1:50pm.

## 2019-10-18 NOTE — Telephone Encounter (Signed)
-----   Message from Evalee Jefferson, LPN sent at [phone]/3295  4:15 PM EDT ----- Lynden Ang would you call this patient to get her scheduled for a follow up appointment with Dr Lavon Paganini please.  Dr Lavon Paganini got a message from her pulmonologist Dr Kimber Relic. She was a Dr Juanda Chance patient.  The reason for the visit is follow up of esophageal dysmotility, stricture, GERD Thank you

## 2019-10-18 NOTE — Telephone Encounter (Signed)
NOTES ON FILE FROM [facility] [phone], SENT REFERRAL TO SCHEDULING 

## 2019-11-03 LAB — MYOMARKER 3 PLUS PROFILE (RDL)

## 2019-11-04 ENCOUNTER — Other Ambulatory Visit (HOSPITAL_COMMUNITY)
Admission: RE | Admit: 2019-11-04 | Discharge: 2019-11-04 | Disposition: A | Discharge status: Home / Self Care | Payer: Medicare Other | Source: Ambulatory Visit | Attending: Pulmonary Disease | Admitting: Pulmonary Disease

## 2019-11-04 DIAGNOSIS — Z01812 Encounter for preprocedural laboratory examination: Secondary | ICD-10-CM | POA: Insufficient documentation

## 2019-11-04 DIAGNOSIS — Z20822 Contact with and (suspected) exposure to covid-19: Secondary | ICD-10-CM | POA: Insufficient documentation

## 2019-11-04 LAB — SARS CORONAVIRUS 2 (TAT 6-24 HRS): SARS Coronavirus 2: NEGATIVE

## 2019-11-07 ENCOUNTER — Other Ambulatory Visit: Payer: Self-pay

## 2019-11-07 ENCOUNTER — Ambulatory Visit (INDEPENDENT_AMBULATORY_CARE_PROVIDER_SITE_OTHER): Payer: Medicare Other | Admitting: Pulmonary Disease

## 2019-11-07 DIAGNOSIS — R05 Cough: Secondary | ICD-10-CM

## 2019-11-07 DIAGNOSIS — J849 Interstitial pulmonary disease, unspecified: Secondary | ICD-10-CM

## 2019-11-07 DIAGNOSIS — R059 Cough, unspecified: Secondary | ICD-10-CM

## 2019-11-07 LAB — PULMONARY FUNCTION TEST
DL/VA % pred: 73 %
DL/VA % pred: 73 %
DL/VA: 2.99 ml/min/mmHg/L
DL/VA: 2.99 ml/min/mmHg/L
DLCO cor % pred: 70 %
DLCO cor % pred: 70 %
DLCO cor: 12.64 ml/min/mmHg
DLCO cor: 12.64 ml/min/mmHg
DLCO unc % pred: 70 %
DLCO unc % pred: 70 %
DLCO unc: 12.64 ml/min/mmHg
DLCO unc: 12.64 ml/min/mmHg
FEF 25-75 Post: 1.92 L/sec
FEF 25-75 Post: 1.92 L/sec
FEF 25-75 Pre: 1.41 L/sec
FEF 25-75 Pre: 1.41 L/sec
FEF2575-%Change-Post: 35 %
FEF2575-%Change-Post: 35 %
FEF2575-%Pred-Post: 185 %
FEF2575-%Pred-Post: 185 %
FEF2575-%Pred-Pre: 136 %
FEF2575-%Pred-Pre: 136 %
FEV1-%Change-Post: 7 %
FEV1-%Change-Post: 7 %
FEV1-%Pred-Post: 119 %
FEV1-%Pred-Post: 119 %
FEV1-%Pred-Pre: 111 %
FEV1-%Pred-Pre: 111 %
FEV1-Post: 1.95 L
FEV1-Post: 1.95 L
FEV1-Pre: 1.81 L
FEV1-Pre: 1.81 L
FEV1FVC-%Change-Post: -1 %
FEV1FVC-%Change-Post: -1 %
FEV1FVC-%Pred-Pre: 103 %
FEV1FVC-%Pred-Pre: 103 %
FEV6-%Change-Post: 8 %
FEV6-%Change-Post: 8 %
FEV6-%Pred-Post: 123 %
FEV6-%Pred-Post: 123 %
FEV6-%Pred-Pre: 114 %
FEV6-%Pred-Pre: 114 %
FEV6-Post: 2.57 L
FEV6-Post: 2.57 L
FEV6-Pre: 2.37 L
FEV6-Pre: 2.37 L
FEV6FVC-%Change-Post: -1 %
FEV6FVC-%Change-Post: -1 %
FEV6FVC-%Pred-Post: 103 %
FEV6FVC-%Pred-Post: 103 %
FEV6FVC-%Pred-Pre: 104 %
FEV6FVC-%Pred-Pre: 104 %
FVC-%Change-Post: 9 %
FVC-%Change-Post: 9 %
FVC-%Pred-Post: 119 %
FVC-%Pred-Post: 119 %
FVC-%Pred-Pre: 109 %
FVC-%Pred-Pre: 109 %
FVC-Post: 2.66 L
FVC-Post: 2.66 L
FVC-Pre: 2.42 L
Post FEV1/FVC ratio: 74 %
Post FEV1/FVC ratio: 74 %
Post FEV6/FVC ratio: 97 %
Post FEV6/FVC ratio: 97 %
Pre FEV1/FVC ratio: 75 %
Pre FEV1/FVC ratio: 75 %
Pre FEV6/FVC Ratio: 98 %
Pre FEV6/FVC Ratio: 98 %
RV % pred: 116 %
RV % pred: 116 %
RV: 2.87 L
RV: 2.87 L
TLC % pred: 107 %
TLC % pred: 107 %
TLC: 5.27 L
TLC: 5.27 L

## 2019-11-07 NOTE — Progress Notes (Signed)
PFT done today. 

## 2019-11-14 ENCOUNTER — Ambulatory Visit (INDEPENDENT_AMBULATORY_CARE_PROVIDER_SITE_OTHER): Payer: Medicare Other | Admitting: Adult Health

## 2019-11-14 ENCOUNTER — Ambulatory Visit: Payer: Medicare Other | Admitting: Pulmonary Disease

## 2019-11-14 ENCOUNTER — Other Ambulatory Visit: Payer: Self-pay

## 2019-11-14 DIAGNOSIS — R059 Cough, unspecified: Secondary | ICD-10-CM

## 2019-11-14 DIAGNOSIS — J849 Interstitial pulmonary disease, unspecified: Secondary | ICD-10-CM | POA: Diagnosis not present

## 2019-11-14 DIAGNOSIS — R05 Cough: Secondary | ICD-10-CM

## 2019-11-14 NOTE — Progress Notes (Signed)
Virtual Visit via Telephone Note  I connected with Tina [name] on 11/14/19 at  1:30 PM EDT by telephone and verified that I am speaking with the correct person using two identifiers.  Location: Patient: Home  Provider: Home    I discussed the limitations, risks, security and privacy concerns of performing an evaluation and management service by telephone and the availability of in person appointments. I also discussed with the patient that there may be a patient responsible charge related to this service. The patient expressed understanding and agreed to proceed.   History of Present Illness: 86 year old female former smoker seen for pulmonary consult October 10, 2019 for abnormal CT chest for possible interstitial lung disease.  Today's televisit is a 1 month follow-up for interstitial lung disease.  Patient was seen last visit for a pulmonary consult.  For abnormal CT chest for possible interstitial lung disease.  Patient was seen in the emergency room June 2020 with CT showing upper lobe infiltrate.  She had a follow-up CT chest January 2021 that showed resolution of infiltrate.  There was mild interstitial changes along the bases.  She had associated dyspnea with exertion and dry cough.  Says she occasionally has episodes which she describes as coughing fits where she has severe dry coughing episodes.  She does have daily reflux.  She says she was on some reflux medicines a while back but has not been on anything recently.  She does have known esophageal dysmotility with previous stricture and dilatation required.  She has an appointment with GI next week. Patient was set up for pulmonary function testing that showed normal lung function with no airflow obstruction or restriction.  FEV1 was 119%, ratio 74, FVC 119%, no bronchodilator response.  DLCO was decreased at 70%. Autoimmune and connective tissue labs were unrevealing.  ANA was slightly positive but negative CCP otherwise rest were  negative.  Observations/Objective: ILD workup :   Pets: No pets Occupation: Housewife ILD questionnaire 10/11/2018: Notes occasional mold in the shower.  No hot tub, Jacuzzi, humidifier.  She does have a down pillow.   Smoking history: 8-pack-year smoker.  Quit in 1965. Travel history: No significant travel Relevant family history: No significant family history of lung disease  CT head, C-spine 01/29/2019-2 cm hazy opacity in the left upper lobe. CT chest 08/20/2019-left lobe opacity has resolved.  Minimal subpleural reticulation, groundglass opacities in the lower lungs  Assessment and Plan: Mild interstitial lung disease changes on CT.  Patient does have some risk factors including GERD.  She is to follow-up with GI as planned.  We will begin reflux treatment regimen.  Have advised her to advance activity as tolerated.  Can add Delsym for cough control.  Pulmonary function testing shows normal lung function she does have a slightly decreased DLCO.  She is not oxygen dependent.  We will continue to follow closely.  Autoimmune and connective tissue work-up is unrevealing.  Plan  Patient Instructions  Begin Prilosec 20mg  daily before meal .  Begin Pepcid 20mg  At bedtime  .  GERD diet.  Delsym 2 tsp Twice daily  As needed  Cough .  Activity as tolerated  Follow up with Dr. in 4 months and As needed        Follow Up Instructions: Follow-up in 4 months and as needed    I discussed the assessment and treatment plan with the patient. The patient was provided an opportunity to ask questions and all were answered. The patient agreed with  the plan and demonstrated an understanding of the instructions.   The patient was advised to call back or seek an in-person evaluation if the symptoms worsen or if the condition fails to improve as anticipated.  I provided 23 minutes of non-face-to-face time during this encounter.   Rexene Edison, NP

## 2019-11-14 NOTE — Patient Instructions (Signed)
Begin Prilosec 20mg  daily before meal .  Begin Pepcid 20mg  At bedtime  .  GERD diet.  Delsym 2 tsp Twice daily  As needed  Cough .  Activity as tolerated  Follow up with Dr. in 4 months and As needed

## 2019-11-20 ENCOUNTER — Encounter: Payer: Self-pay | Admitting: Cardiovascular Disease

## 2019-11-20 ENCOUNTER — Ambulatory Visit: Payer: Medicare Other | Admitting: Cardiovascular Disease

## 2019-11-20 ENCOUNTER — Encounter: Payer: Self-pay | Admitting: *Deleted

## 2019-11-20 ENCOUNTER — Other Ambulatory Visit: Payer: Self-pay

## 2019-11-20 VITALS — BP 132/62 | HR 68 | Ht 64.0 in | Wt 148.6 lb

## 2019-11-20 DIAGNOSIS — R0600 Dyspnea, unspecified: Secondary | ICD-10-CM

## 2019-11-20 DIAGNOSIS — R0602 Shortness of breath: Secondary | ICD-10-CM | POA: Diagnosis not present

## 2019-11-20 DIAGNOSIS — E785 Hyperlipidemia, unspecified: Secondary | ICD-10-CM | POA: Diagnosis not present

## 2019-11-20 DIAGNOSIS — I2510 Atherosclerotic heart disease of native coronary artery without angina pectoris: Secondary | ICD-10-CM

## 2019-11-20 DIAGNOSIS — R0609 Other forms of dyspnea: Secondary | ICD-10-CM

## 2019-11-20 DIAGNOSIS — I209 Angina pectoris, unspecified: Secondary | ICD-10-CM | POA: Insufficient documentation

## 2019-11-20 DIAGNOSIS — I2584 Coronary atherosclerosis due to calcified coronary lesion: Secondary | ICD-10-CM

## 2019-11-20 MED ORDER — LOSARTAN POTASSIUM 25 MG PO TABS: 25.0000 mg | ORAL_TABLET | Freq: Every day | ORAL | 3 refills | Status: DC

## 2019-11-20 NOTE — Patient Instructions (Signed)
Medication Instructions:  1) START Losartan 25mg  once daily 2) START Aspirin 81mg  once daily  *If you need a refill on your cardiac medications before your next appointment, please call your pharmacy*   Lab Work: BMET in 3 weeks.  You will need fasting labs when you return to see Dr. .  Make sure you have nothing to eat or drink after midnight except water and black coffee (Lipid, Liver, BMET).  If you have labs (blood work) drawn today and your tests are completely normal, you will receive your results only by: MyChart Message (if you have MyChart) OR . A paper copy in the mail If you have any lab test that is abnormal or we need to change your treatment, we will call you to review the results.   Testing/Procedures: Your physician has requested that you have a lexiscan myoview. For further information please visit Elease Hashimoto. Please follow instruction sheet, as given.    Follow-Up: At [facility], you and your health needs are our priority.  As part of our continuing mission to provide you with exceptional heart care, we have created designated Provider Care Teams.  These Care Teams include your primary Cardiologist (physician) and Advanced Practice Providers (APPs -  Physician Assistants and Nurse Practitioners) who all work together to provide you with the care you need, when you need it.  We recommend signing up for the patient portal called "MyChart".  Sign up information is provided on this After Visit Summary.  MyChart is used to connect with patients for Virtual Visits (Telemedicine).  Patients are able to view lab/test results, encounter notes, upcoming appointments, etc.  Non-urgent messages can be sent to your provider as well.   To learn more about what you can do with MyChart, go to [email or web address]    Your next appointment:   3 month(s)  The format for your next appointment:   In Person  Provider:   You may see Dr. [facility] or one of  the following Advanced Practice Providers on your designated Care Team:    [email or web address], PA-C  Vin Utting, Tereso Newcomer  [place], NP    Other Instructions

## 2019-11-20 NOTE — Progress Notes (Signed)
Cardiology Office Note:    Date:  11/20/2019   ID:  Tina [name], DOB Aug 19, 1930, MRN [id number]  PCP:  Tina Hansen, NP  Cardiologist:  Tina [name]  Electrophysiologist:  None   Referring MD: Tina Hansen, NP   Chief Complaint  Patient presents with  . Coronary Artery Disease  . Interstitial Lung Disease  I  History of Present Illness:    Tina [name] is a 86 y.o. female with a hx of CAD.    She was referred to me by Dr. Yetta [name] for further management of her CAD  Seen with her daughter, Tina [name].   She is seen frequently by the pulmonary group for her ILD. I saw her about 10 years ago   She fell and broke her arm last year. The CT scan showed interstitial lung disease.  She was incidentally found to have coronary artery calcifications   Does have some intrascapular CP with walking or when she gets tired.   Resolves with rest  No syncope   Has a chronic cough Does not do any regular exercise.   Does her house work .  Has occasional falling episdoses from dizziness and being unsteady.  Has symptoms c/w orthostasis.     Past Medical History:  Diagnosis Date  . Canker sore   . Cystocele   . Depression   . Diverticulosis   . Esophageal dysmotility   . Esophageal stricture   . GERD (gastroesophageal reflux disease)   . Heartburn   . Hypertension   . TIA (transient ischemic attack)     Past Surgical History:  Procedure Laterality Date  . APPENDECTOMY    . CATARACT EXTRACTION    . COLON SURGERY    . JOINT REPLACEMENT    . PARTIAL COLECTOMY  09/1991   due to diverticulitis  . SHOULDER SURGERY     right shoulder- removal calcium deposit  . TOTAL HIP ARTHROPLASTY  6/03  . TOTAL HIP ARTHROPLASTY Left 02/24/2016   Procedure: LEFT TOTAL HIP ARTHROPLASTY ANTERIOR APPROACH;  Surgeon: [name], MD;  Location: [facility];  Service: Orthopedics;  Laterality: Left;    Current Medications: Current Meds  Medication Sig  . acetaminophen (TYLENOL) 500 MG tablet Take  500 mg by mouth every 6 (six) hours as needed for mild pain, moderate pain, fever or headache.  . albuterol (VENTOLIN HFA) 108 (90 Base) MCG/ACT inhaler Inhale 2 puffs into the lungs every 6 (six) hours as needed.  Marland Kitchen alendronate (FOSAMAX) 70 MG tablet Take 70 mg by mouth every Sunday. Take with a full glass of water on an empty stomach.  Marland Kitchen atorvastatin (LIPITOR) 10 MG tablet Take 10 mg by mouth daily at 6 PM.   . Biotin 1 MG CAPS Take 1 mg by mouth daily.  . Calcium Carbonate-Vitamin D (CALCIUM 600+D) 600-400 MG-UNIT tablet Take 1 tablet by mouth daily.  . cholecalciferol (VITAMIN D) 1000 units tablet Take 1,000 Units by mouth every other day.  . ciprofloxacin (CIPRO) 250 MG tablet Take 1 tablet by mouth as needed. For uti  . citalopram (CELEXA) 20 MG tablet Take 20 mg by mouth daily.    Marland Kitchen docusate sodium (COLACE) 100 MG capsule Take 1 capsule (100 mg total) by mouth every 12 (twelve) hours.  Marland Kitchen estradiol (ESTRACE) 0.1 MG/GM vaginal cream Place 1 Applicatorful vaginally as needed.   Marland Kitchen HYDROcodone-homatropine (HYCODAN) 5-1.5 MG/5ML syrup Take 5 mLs by mouth every 6 (six) hours as needed for cough.  . polyethylene glycol (MIRALAX /  GLYCOLAX) packet Take 17 g by mouth daily as needed for mild constipation.  . triamcinolone (KENALOG) 0.1 % paste Use as directed 1 application in the mouth or throat 2 (two) times daily as needed (for oral lesions).  . zolpidem (AMBIEN) 5 MG tablet Take 2.5 mg by mouth at bedtime as needed for sleep.      Allergies:   Valdecoxib and Prednisone   Social History   Socioeconomic History  . Marital status: Married    Spouse name: Not on file  . Number of children: Not on file  . Years of education: Not on file  . Highest education level: Not on file  Occupational History  . Not on file  Tobacco Use  . Smoking status: Former Smoker    Packs/day: 0.50    Years: 15.00    Pack years: 7.50    Types: Cigarettes    Quit date: 08/02/1963    Years since quitting: 56.3   . Smokeless tobacco: Never Used  Substance and Sexual Activity  . Alcohol use: No  . Drug use: No  . Sexual activity: Not on file  Other Topics Concern  . Not on file  Social History Narrative  . Not on file   Social Determinants of Health   Financial Resource Strain:   . Difficulty of Paying Living Expenses:   Food Insecurity:   . Worried About Charity fundraiser in the Last Year:   . Arboriculturist in the Last Year:   Transportation Needs:   . Film/video editor (Medical):   Marland Kitchen Lack of Transportation (Non-Medical):   Physical Activity:   . Days of Exercise per Week:   . Minutes of Exercise per Session:   Stress:   . Feeling of Stress :   Social Connections:   . Frequency of Communication with Friends and Family:   . Frequency of Social Gatherings with Friends and Family:   . Attends Religious Services:   . Active Member of Clubs or Organizations:   . Attends Archivist Meetings:   Marland Kitchen Marital Status:      Family History: The patient's family history includes Cancer in her father, paternal aunt, and paternal uncle; Diabetes in her father and sister; Heart disease in her brother, father, and sister; Leukemia in her sister.  ROS:   Please see the history of present illness.     All other systems reviewed and are negative.  EKGs/Labs/Other Studies Reviewed:    The following studies were reviewed today:    Recent Labs: No results found for requested labs within last 8760 hours.  Recent Lipid Panel No results found for: CHOL, TRIG, HDL, CHOLHDL, VLDL, LDLCALC, LDLDIRECT  Physical Exam:    VS:  BP 132/62   Pulse 68   Ht 5\' 4"  (1.626 m)   Wt 148 lb 9.6 oz (67.4 kg)   SpO2 96%   BMI 25.51 kg/m     Wt Readings from Last 3 Encounters:  11/20/19 148 lb 9.6 oz (67.4 kg)  10/10/19 151 lb 3.2 oz (68.6 kg)  09/05/19 153 lb (69.4 kg)     GEN: elderly female,  NAD  HEENT: Normal NECK: No JVD; No carotid bruits LYMPHATICS: No lymphadenopathy  CARDIAC: RRR, no murmurs, rubs, gallops RESPIRATORY:  Clear to auscultation without rales, wheezing or rhonchi  ABDOMEN: Soft, non-tender, non-distended MUSCULOSKELETAL:  No edema; No deformity  SKIN: Warm and dry NEUROLOGIC:  Alert and oriented x 3 PSYCHIATRIC:  Normal affect  ECG :  November 20, 2019:  NSR at 12.  No ST or T wave changes   ASSESSMENT:    1. DOE (dyspnea on exertion)   2. Shortness of breath   3. Hyperlipidemia, unspecified hyperlipidemia type    PLAN:    In order of problems listed above:  1. Angina:    Has angina like symptoms with walking , she is also been found to have coronary artery calcifications. Because of her symptoms of intrascapular chest pain with exertion, I think we should proceed with a Lexiscan Myoview study.  She is 86 years old but she is fairly healthy for 23. Was started on aspirin 81 mg a day.  We considered starting her on isosorbide but she is already having symptoms of orthostatic hypotension so I'm not sure that I want to start isosorbide at this time.  2.   Hypertension: Her primary medical doctor wrote her prescription for lisinopril but she has never started it. She and her daughter are worried about her chronic cough. I would like to start her on losartan 25 mg a day instead of lisinopril. This should eliminate the cough issue.  Three. Hyperlipidemia: She is on low-dose of atorvastatin. We'll check fasting lipids, liver enzymes, basic metabolic profile when she seen again in 3 months.     Medication Adjustments/Labs and Tests Ordered: Current medicines are reviewed at length with the patient today.  Concerns regarding medicines are outlined above.  Orders Placed This Encounter  Procedures  . Basic metabolic panel  . Hepatic function panel  . Lipid panel  . Basic metabolic panel  . MYOCARDIAL PERFUSION IMAGING  . EKG 12-Lead   Meds ordered this encounter  Medications  . losartan (COZAAR) 25 MG tablet    Sig: Take 1 tablet  (25 mg total) by mouth daily.    Dispense:  90 tablet    Refill:  3    Patient Instructions  Medication Instructions:  1) START Losartan 25mg  once daily 2) START Aspirin 81mg  once daily  *If you need a refill on your cardiac medications before your next appointment, please call your pharmacy*   Lab Work: BMET in 3 weeks.  You will need fasting labs when you return to see Dr. .  Make sure you have nothing to eat or drink after midnight except water and black coffee (Lipid, Liver, BMET).  If you have labs (blood work) drawn today and your tests are completely normal, you will receive your results only by: MyChart Message (if you have MyChart) OR . A paper copy in the mail If you have any lab test that is abnormal or we need to change your treatment, we will call you to review the results.   Testing/Procedures: Your physician has requested that you have a lexiscan myoview. For further information please visit Elease Hashimoto. Please follow instruction sheet, as given.    Follow-Up: At [facility], you and your health needs are our priority.  As part of our continuing mission to provide you with exceptional heart care, we have created designated Provider Care Teams.  These Care Teams include your primary Cardiologist (physician) and Advanced Practice Providers (APPs -  Physician Assistants and Nurse Practitioners) who all work together to provide you with the care you need, when you need it.  We recommend signing up for the patient portal called "MyChart".  Sign up information is provided on this After Visit Summary.  MyChart is used to connect with patients for Virtual Visits (Telemedicine).  Patients are able to view lab/test results, encounter notes, upcoming appointments, etc.  Non-urgent messages can be sent to your provider as well.   To learn more about what you can do with MyChart, go to [email or web address].    Your next appointment:   3 month(s)  The  format for your next appointment:   In Person  Provider:   You may see Dr. Kristeen Miss or one of the following Advanced Practice Providers on your designated Care Team:    Tereso Newcomer, PA-C  Vin [place], [place]  Berton Bon, NP    Other Instructions       Signed, Kristeen Miss, MD  11/20/2019 2:54 PM    [facility]

## 2019-11-25 ENCOUNTER — Ambulatory Visit: Payer: Medicare Other | Admitting: Gastroenterology

## 2019-11-25 ENCOUNTER — Encounter: Payer: Self-pay | Admitting: Gastroenterology

## 2019-11-25 VITALS — BP 112/56 | HR 70 | Temp 97.2°F | Ht 64.0 in | Wt 148.0 lb

## 2019-11-25 DIAGNOSIS — M6289 Other specified disorders of muscle: Secondary | ICD-10-CM

## 2019-11-25 DIAGNOSIS — R159 Full incontinence of feces: Secondary | ICD-10-CM

## 2019-11-25 DIAGNOSIS — J849 Interstitial pulmonary disease, unspecified: Secondary | ICD-10-CM

## 2019-11-25 DIAGNOSIS — R1310 Dysphagia, unspecified: Secondary | ICD-10-CM | POA: Diagnosis not present

## 2019-11-25 DIAGNOSIS — K219 Gastro-esophageal reflux disease without esophagitis: Secondary | ICD-10-CM | POA: Diagnosis not present

## 2019-11-25 DIAGNOSIS — R05 Cough: Secondary | ICD-10-CM

## 2019-11-25 DIAGNOSIS — R053 Chronic cough: Secondary | ICD-10-CM

## 2019-11-25 MED ORDER — FAMOTIDINE 20 MG PO TABS: 20.0000 mg | ORAL_TABLET | Freq: Two times a day (BID) | ORAL | 3 refills | Status: DC

## 2019-11-25 NOTE — Progress Notes (Signed)
Tina [name]    [id number]    12-29-1930  Primary Care Physician:Jones, Sherrill Raring, NP  Referring Physician: Berkley Harvey, NP [place],  [place] [id number]   Chief complaint: Dysphagia, GERD, chronic cough  HPI:  86 year old female with history of chronic GERD and esophageal stricture here with complaints of dysphagia and chronic cough  EGD April 19, 2011: Mild esophageal stricture, dilation performed with Savary dilators 15-16- 17 mm  Colonoscopy April 15, 2011: Colonic diverticulosis otherwise normal exam.  No recall recommended due to age  She has trouble mostly with meat and also dry bread. She goes into coughing spells sometimes when she tries to eat cornbread.  She was on PPI for a long duration of time, stopped taking them approximately 3 years ago as she was worried about potential adverse reaction with long-term PPI use. She was taking Zantac until it was taken off the market. She is currently using Tums as needed. Hesitant to restart PPI.  Chronic cough and dyspnea on exertion, undergoing work-up for possible interstitial lung disease  ?  Angina-like symptoms, has coronary artery calcifications.  Plan for [facility] study by cardiology, is scheduled for May 2021  Complains of incomplete evacuation and fecal seepage intermittently. No rectal bleeding or melena.   Outpatient Encounter Medications as of 11/25/2019  Medication Sig  . acetaminophen (TYLENOL) 500 MG tablet Take 500 mg by mouth every 6 (six) hours as needed for mild pain, moderate pain, fever or headache.  . albuterol (VENTOLIN HFA) 108 (90 Base) MCG/ACT inhaler Inhale 2 puffs into the lungs every 6 (six) hours as needed.  Marland Kitchen alendronate (FOSAMAX) 70 MG tablet Take 70 mg by mouth every Sunday. Take with a full glass of water on an empty stomach.  Marland Kitchen atorvastatin (LIPITOR) 10 MG tablet Take 10 mg by mouth daily at 6 PM.   . Biotin 1 MG CAPS Take 1 mg by mouth  daily.  . Calcium Carbonate-Vitamin D (CALCIUM 600+D) 600-400 MG-UNIT tablet Take 1 tablet by mouth daily.  . cholecalciferol (VITAMIN D) 1000 units tablet Take 1,000 Units by mouth every other day.  . ciprofloxacin (CIPRO) 250 MG tablet Take 1 tablet by mouth as needed. For uti  . citalopram (CELEXA) 20 MG tablet Take 20 mg by mouth daily.    Marland Kitchen docusate sodium (COLACE) 100 MG capsule Take 1 capsule (100 mg total) by mouth every 12 (twelve) hours.  Marland Kitchen estradiol (ESTRACE) 0.1 MG/GM vaginal cream Place 1 Applicatorful vaginally as needed.   Marland Kitchen HYDROcodone-homatropine (HYCODAN) 5-1.5 MG/5ML syrup Take 5 mLs by mouth every 6 (six) hours as needed for cough.  . losartan (COZAAR) 25 MG tablet Take 1 tablet (25 mg total) by mouth daily.  . polyethylene glycol (MIRALAX / GLYCOLAX) packet Take 17 g by mouth daily as needed for mild constipation.  . triamcinolone (KENALOG) 0.1 % paste Use as directed 1 application in the mouth or throat 2 (two) times daily as needed (for oral lesions).  . zolpidem (AMBIEN) 5 MG tablet Take 2.5 mg by mouth at bedtime as needed for sleep.    No facility-administered encounter medications on file as of 11/25/2019.    Allergies as of 11/25/2019 - Review Complete 11/20/2019  Allergen Reaction Noted  . Valdecoxib Other (See Comments) 02/15/2016  . Prednisone Rash     Past Medical History:  Diagnosis Date  . Canker sore   . Cystocele   .  Depression   . Diverticulosis   . Esophageal dysmotility   . Esophageal stricture   . GERD (gastroesophageal reflux disease)   . Heartburn   . Hypertension   . TIA (transient ischemic attack)     Past Surgical History:  Procedure Laterality Date  . APPENDECTOMY    . CATARACT EXTRACTION    . COLON SURGERY    . JOINT REPLACEMENT    . PARTIAL COLECTOMY  09/1991   due to diverticulitis  . SHOULDER SURGERY     right shoulder- removal calcium deposit  . TOTAL HIP ARTHROPLASTY  6/03  . TOTAL HIP ARTHROPLASTY Left 02/24/2016    Procedure: LEFT TOTAL HIP ARTHROPLASTY ANTERIOR APPROACH;  Surgeon: [name], MD;  Location: [facility];  Service: Orthopedics;  Laterality: Left;    Family History  Problem Relation Age of Onset  . Cancer Paternal Aunt   . Cancer Paternal Uncle   . Diabetes Father   . Heart disease Father   . Cancer Father   . Diabetes Sister   . Heart disease Sister   . Leukemia Sister   . Heart disease Brother     Social History   Socioeconomic History  . Marital status: Married    Spouse name: Not on file  . Number of children: Not on file  . Years of education: Not on file  . Highest education level: Not on file  Occupational History  . Not on file  Tobacco Use  . Smoking status: Former Smoker    Packs/day: 0.50    Years: 15.00    Pack years: 7.50    Types: Cigarettes    Quit date: 08/02/1963    Years since quitting: 56.3  . Smokeless tobacco: Never Used  Substance and Sexual Activity  . Alcohol use: No  . Drug use: No  . Sexual activity: Not on file  Other Topics Concern  . Not on file  Social History Narrative  . Not on file   Social Determinants of Health   Financial Resource Strain:   . Difficulty of Paying Living Expenses:   Food Insecurity:   . Worried About Programme researcher, broadcasting/film/video in the Last Year:   . Barista in the Last Year:   Transportation Needs:   . Freight forwarder (Medical):   Marland Kitchen Lack of Transportation (Non-Medical):   Physical Activity:   . Days of Exercise per Week:   . Minutes of Exercise per Session:   Stress:   . Feeling of Stress :   Social Connections:   . Frequency of Communication with Friends and Family:   . Frequency of Social Gatherings with Friends and Family:   . Attends Religious Services:   . Active Member of Clubs or Organizations:   . Attends Banker Meetings:   Marland Kitchen Marital Status:   Intimate Partner Violence:   . Fear of Current or Ex-Partner:   . Emotionally Abused:   Marland Kitchen Physically Abused:   . Sexually  Abused:       Review of systems: All other review of systems negative except as mentioned in the HPI.   Physical Exam: Vitals:   11/25/19 1349  BP: (!) 112/56  Pulse: 70  Temp: (!) 97.2 F (36.2 C)  SpO2: 97%   Body mass index is 25.4 kg/m. Gen:      No acute distress Abd:      soft, non-tender; no palpable masses, no distension Neuro: alert and oriented x 3 Psych: normal  mood and affect  Data Reviewed:  Reviewed labs, radiology imaging, old records and pertinent past GI work up   Assessment and Plan/Recommendations:  86 year old female with history of chronic cough, GERD and esophageal stricture with complaints of intermittent dysphagia predominantly to solids  We will schedule for EGD for evaluation, to exclude erosive esophagitis, recurrent esophageal stricture. Plan for esophageal dilation +/- biopsies If no obvious mucosal signs of reflux disease, will plan to place 48-hour pH Bravo sensor to evaluate for degree of acid reflux and if there is any correlation between chronic cough and reflux  Patient remains reluctant to restart PPI, will rediscuss based on results of EGD and Bravo study Start Pepcid 20 mg twice daily Discussed antireflux measures  ? Esophageal dysmotility, history of ILD will hold off scheduling esophageal manometry  She is scheduled for cardiac Myoview scan May 17, will tentatively plan for EGD in June. May need rescheduling if has high risk lesion on Myoview scan.  Fecal incontinence with incomplete evacuation: Start Benefiber 1 tablespoon 3 times daily with meals Kegel exercises If continues to have persistent symptoms, will consider anorectal manometry to evaluate for dyssynergic defecation and refer to pelvic floor physical therapy   The risks and benefits as well as alternatives of endoscopic procedure(s) have been discussed and reviewed. All questions answered. The patient agrees to proceed.   The patient was provided an opportunity to  ask questions and all were answered. The patient agreed with the plan and demonstrated an understanding of the instructions.  Iona Beard , MD    CC: Iona Hansen, NP

## 2019-11-25 NOTE — Patient Instructions (Signed)
We have sent the following medications to your pharmacy for you to pick up at your convenience: Pepcid   Start Pepcid 20mg  - twice daily.   Benefiber- 1 tablespoon three times daily with meals.   Please see information below on GERD-   Gastroesophageal Reflux Disease, Adult Gastroesophageal reflux (GER) happens when acid from the stomach flows up into the tube that connects the mouth and the stomach (esophagus). Normally, food travels down the esophagus and stays in the stomach to be digested. However, when a person has GER, food and stomach acid sometimes move back up into the esophagus. If this becomes a more serious problem, the person may be diagnosed with a disease called gastroesophageal reflux disease (GERD). GERD occurs when the reflux:  Happens often.  Causes frequent or severe symptoms.  Causes problems such as damage to the esophagus. When stomach acid comes in contact with the esophagus, the acid may cause soreness (inflammation) in the esophagus. Over time, GERD may create small holes (ulcers) in the lining of the esophagus. What are the causes? This condition is caused by a problem with the muscle between the esophagus and the stomach (lower esophageal sphincter, or LES). Normally, the LES muscle closes after food passes through the esophagus to the stomach. When the LES is weakened or abnormal, it does not close properly, and that allows food and stomach acid to go back up into the esophagus. The LES can be weakened by certain dietary substances, medicines, and medical conditions, including:  Tobacco use.  Pregnancy.  Having a hiatal hernia.  Alcohol use.  Certain foods and beverages, such as coffee, chocolate, onions, and peppermint. What increases the risk? You are more likely to develop this condition if you:  Have an increased body weight.  Have a connective tissue disorder.  Use NSAID medicines. What are the signs or symptoms? Symptoms of this condition  include:  Heartburn.  Difficult or painful swallowing.  The feeling of having a lump in the throat.  Abitter taste in the mouth.  Bad breath.  Having a large amount of saliva.  Having an upset or bloated stomach.  Belching.  Chest pain. Different conditions can cause chest pain. Make sure you see your health care provider if you experience chest pain.  Shortness of breath or wheezing.  Ongoing (chronic) cough or a night-time cough.  Wearing away of tooth enamel.  Weight loss. How is this diagnosed? Your health care provider will take a medical history and perform a physical exam. To determine if you have mild or severe GERD, your health care provider may also monitor how you respond to treatment. You may also have tests, including:  A test to examine your stomach and esophagus with a small camera (endoscopy).  A test thatmeasures the acidity level in your esophagus.  A test thatmeasures how much pressure is on your esophagus.  A barium swallow or modified barium swallow test to show the shape, size, and functioning of your esophagus. How is this treated? The goal of treatment is to help relieve your symptoms and to prevent complications. Treatment for this condition may vary depending on how severe your symptoms are. Your health care provider may recommend:  Changes to your diet.  Medicine.  Surgery. Follow these instructions at home: Eating and drinking   Follow a diet as recommended by your health care provider. This may involve avoiding foods and drinks such as: ? Coffee and tea (with or without caffeine). ? Drinks that containalcohol. ? Energy drinks  and sports drinks. ? Carbonated drinks or sodas. ? Chocolate and cocoa. ? Peppermint and mint flavorings. ? Garlic and onions. ? Horseradish. ? Spicy and acidic foods, including peppers, chili powder, curry powder, vinegar, hot sauces, and barbecue sauce. ? Citrus fruit juices and citrus fruits, such as  oranges, lemons, and limes. ? Tomato-based foods, such as red sauce, chili, salsa, and pizza with red sauce. ? Fried and fatty foods, such as donuts, french fries, potato chips, and high-fat dressings. ? High-fat meats, such as hot dogs and fatty cuts of red and white meats, such as rib eye steak, sausage, ham, and bacon. ? High-fat dairy items, such as whole milk, butter, and cream cheese.  Eat small, frequent meals instead of large meals.  Avoid drinking large amounts of liquid with your meals.  Avoid eating meals during the 2-3 hours before bedtime.  Avoid lying down right after you eat.  Do not exercise right after you eat. Lifestyle   Do not use any products that contain nicotine or tobacco, such as cigarettes, e-cigarettes, and chewing tobacco. If you need help quitting, ask your health care provider.  Try to reduce your stress by using methods such as yoga or meditation. If you need help reducing stress, ask your health care provider.  If you are overweight, reduce your weight to an amount that is healthy for you. Ask your health care provider for guidance about a safe weight loss goal. General instructions  Pay attention to any changes in your symptoms.  Take over-the-counter and prescription medicines only as told by your health care provider. Do not take aspirin, ibuprofen, or other NSAIDs unless your health care provider told you to do so.  Wear loose-fitting clothing. Do not wear anything tight around your waist that causes pressure on your abdomen.  Raise (elevate) the head of your bed about 6 inches (15 cm).  Avoid bending over if this makes your symptoms worse.  Keep all follow-up visits as told by your health care provider. This is important. Contact a health care provider if:  You have: ? New symptoms. ? Unexplained weight loss. ? Difficulty swallowing or it hurts to swallow. ? Wheezing or a persistent cough. ? A hoarse voice.  Your symptoms do not  improve with treatment. Get help right away if you:  Have pain in your arms, neck, jaw, teeth, or back.  Feel sweaty, dizzy, or light-headed.  Have chest pain or shortness of breath.  Vomit and your vomit looks like blood or coffee grounds.  Faint.  Have stool that is bloody or black.  Cannot swallow, drink, or eat. Summary  Gastroesophageal reflux happens when acid from the stomach flows up into the esophagus. GERD is a disease in which the reflux happens often, causes frequent or severe symptoms, or causes problems such as damage to the esophagus.  Treatment for this condition may vary depending on how severe your symptoms are. Your health care provider may recommend diet and lifestyle changes, medicine, or surgery.  Contact a health care provider if you have new or worsening symptoms.  Take over-the-counter and prescription medicines only as told by your health care provider. Do not take aspirin, ibuprofen, or other NSAIDs unless your health care provider told you to do so.  Keep all follow-up visits as told by your health care provider. This is important. This information is not intended to replace advice given to you by your health care provider. Make sure you discuss any questions you have with your  health care provider. Document Revised: 01/24/2018 Document Reviewed: 01/24/2018 Elsevier Patient Education  [place].  Please see information on Kegel Exercises Handout.   We will see you back in office in about 2-3 months.  Thank you for choosing me and [facility].  Dr. Silverio Decamp

## 2019-11-26 ENCOUNTER — Encounter: Payer: Self-pay | Admitting: Gastroenterology

## 2019-11-26 ENCOUNTER — Telehealth: Payer: Self-pay | Admitting: Cardiovascular Disease

## 2019-11-26 NOTE — Telephone Encounter (Signed)
Returned call to patient who states she forgot to tell Dr. Elease Hashimoto that she wakes up pretty frequently feeling like her heart is quivering. She denies pain when she has the episodes of heart quivering. She states this is different than the dull ache between her shoulder blades that she described to Dr. Elease Hashimoto. She states her PCP thinks that she should wear a heart monitor. She reports BP today 112/56 mmHg, pulse 70  today at the appointment. She does not report that anyone has reported irregular heart rate to her at recent doctor's visits.  I advised that a heart monitor may be necessary in addition to the stress test; advised I will send to Dr. Elease Hashimoto for review. I advised that we would likely want to get the stress test prior to putting a monitor on. She is aware we will call her back with Dr. Harvie Bridge advice and she thanked me for the call.

## 2019-11-26 NOTE — Telephone Encounter (Signed)
  Patient would like to speak to Dr Harvie Bridge nurse regarding her PCP telling her that she needs a monitor on her heart. She says her PCP said she should do that before the myocardial perfusion. She will be out of the home after 9 am tomorrow morning until after lunch.

## 2019-11-28 ENCOUNTER — Other Ambulatory Visit: Payer: Self-pay

## 2019-11-28 ENCOUNTER — Encounter: Payer: Self-pay | Admitting: *Deleted

## 2019-11-28 DIAGNOSIS — I209 Angina pectoris, unspecified: Secondary | ICD-10-CM

## 2019-11-28 NOTE — Progress Notes (Signed)
Patient ID: Tina [name], female   DOB: 03/31/1931, 86 y.o.   MRN: [id number] Patient enrolled for a 14 day ZIO AT monitor to be shipped to her home by 12/16/19.  Patient to apply monitor 12/16/2019 after her stress test.  Instructions mailed to her home and will be included in her monitor kit.

## 2019-11-28 NOTE — Telephone Encounter (Signed)
Lets place a 14 day monitor after the stress test

## 2019-12-12 ENCOUNTER — Telehealth (HOSPITAL_COMMUNITY): Payer: Self-pay | Admitting: *Deleted

## 2019-12-12 NOTE — Telephone Encounter (Signed)
Patient given detailed instructions per Myocardial Perfusion Study Information Sheet for the test on 12/16/19. Patient notified to arrive 15 minutes early and that it is imperative to arrive on time for appointment to keep from having the test rescheduled.  If you need to cancel or reschedule your appointment, please call the office within 24 hours of your appointment. . Patient verbalized understanding. Chanell Nadeau Jacqueline    

## 2019-12-16 ENCOUNTER — Other Ambulatory Visit: Payer: Medicare Other

## 2019-12-16 ENCOUNTER — Other Ambulatory Visit: Payer: Self-pay

## 2019-12-16 ENCOUNTER — Ambulatory Visit (HOSPITAL_COMMUNITY): Payer: Medicare Other | Attending: Cardiology

## 2019-12-16 DIAGNOSIS — R0600 Dyspnea, unspecified: Secondary | ICD-10-CM | POA: Insufficient documentation

## 2019-12-16 DIAGNOSIS — R0609 Other forms of dyspnea: Secondary | ICD-10-CM

## 2019-12-16 LAB — MYOCARDIAL PERFUSION IMAGING
LV dias vol: 65 mL (ref 46–106)
LV sys vol: 21 mL
Peak HR: 89 {beats}/min
Rest HR: 60 {beats}/min
SDS: 0
SRS: 0
SSS: 0
TID: 1.06

## 2019-12-16 LAB — BASIC METABOLIC PANEL
BUN/Creatinine Ratio: 26 (ref 12–28)
BUN: 25 mg/dL (ref 8–27)
CO2: 28 mmol/L (ref 20–29)
Calcium: 9.4 mg/dL (ref 8.7–10.3)
Chloride: 103 mmol/L (ref 96–106)
Creatinine, Ser: 0.96 mg/dL (ref 0.57–1.00)
GFR calc Af Amer: 62 mL/min/{1.73_m2} (ref >59)
GFR calc non Af Amer: 54 mL/min/{1.73_m2} — ABNORMAL LOW (ref >59)
Glucose: 96 mg/dL (ref 65–99)
Potassium: 5 mmol/L (ref 3.5–5.2)
Sodium: 141 mmol/L (ref 134–144)

## 2019-12-16 MED ORDER — REGADENOSON 0.4 MG/5ML IV SOLN
0.4000 mg | Freq: Once | INTRAVENOUS | Status: AC
  Administered 2019-12-16: 0.4 mg via INTRAVENOUS

## 2019-12-16 MED ORDER — TECHNETIUM TC 99M TETROFOSMIN IV KIT
32.4000 | PACK | Freq: Once | INTRAVENOUS | Status: AC | PRN
  Administered 2019-12-16: 32.4 via INTRAVENOUS
  Filled 2019-12-16: qty 33

## 2019-12-16 MED ORDER — TECHNETIUM TC 99M TETROFOSMIN IV KIT
9.8000 | PACK | Freq: Once | INTRAVENOUS | Status: AC | PRN
  Administered 2019-12-16: 9.8 via INTRAVENOUS
  Filled 2019-12-16: qty 10

## 2019-12-16 MED ORDER — AMINOPHYLLINE 25 MG/ML IV SOLN
75.0000 mg | Freq: Once | INTRAVENOUS | Status: AC
Start: 2019-12-16 — End: 2019-12-16
  Administered 2019-12-16: 75 mg via INTRAVENOUS

## 2019-12-28 ENCOUNTER — Other Ambulatory Visit (HOSPITAL_COMMUNITY)
Admission: RE | Admit: 2019-12-28 | Discharge: 2019-12-28 | Disposition: A | Discharge status: Home / Self Care | Payer: Medicare Other | Source: Ambulatory Visit | Attending: Gastroenterology | Admitting: Gastroenterology

## 2019-12-28 DIAGNOSIS — Z01812 Encounter for preprocedural laboratory examination: Secondary | ICD-10-CM | POA: Diagnosis present

## 2019-12-28 DIAGNOSIS — Z20822 Contact with and (suspected) exposure to covid-19: Secondary | ICD-10-CM | POA: Insufficient documentation

## 2019-12-28 LAB — SARS CORONAVIRUS 2 (TAT 6-24 HRS): SARS Coronavirus 2: NEGATIVE

## 2020-01-01 ENCOUNTER — Ambulatory Visit (HOSPITAL_COMMUNITY): Payer: Medicare Other | Admitting: Anesthesiology

## 2020-01-01 ENCOUNTER — Encounter (HOSPITAL_COMMUNITY): Payer: Self-pay | Admitting: Gastroenterology

## 2020-01-01 ENCOUNTER — Other Ambulatory Visit: Payer: Self-pay

## 2020-01-01 ENCOUNTER — Ambulatory Visit (HOSPITAL_COMMUNITY)
Admission: RE | Admit: 2020-01-01 | Discharge: 2020-01-01 | Disposition: A | Discharge status: Home / Self Care | Payer: Medicare Other | Attending: Gastroenterology | Admitting: Gastroenterology

## 2020-01-01 ENCOUNTER — Encounter (HOSPITAL_COMMUNITY)
Admission: RE | Disposition: A | Discharge status: Home / Self Care | Payer: Self-pay | Source: Home / Self Care | Attending: Gastroenterology

## 2020-01-01 DIAGNOSIS — K219 Gastro-esophageal reflux disease without esophagitis: Secondary | ICD-10-CM | POA: Diagnosis not present

## 2020-01-01 DIAGNOSIS — F329 Major depressive disorder, single episode, unspecified: Secondary | ICD-10-CM | POA: Insufficient documentation

## 2020-01-01 DIAGNOSIS — M6289 Other specified disorders of muscle: Secondary | ICD-10-CM

## 2020-01-01 DIAGNOSIS — Z96642 Presence of left artificial hip joint: Secondary | ICD-10-CM | POA: Insufficient documentation

## 2020-01-01 DIAGNOSIS — R159 Full incontinence of feces: Secondary | ICD-10-CM

## 2020-01-01 DIAGNOSIS — R05 Cough: Secondary | ICD-10-CM | POA: Insufficient documentation

## 2020-01-01 DIAGNOSIS — Z8673 Personal history of transient ischemic attack (TIA), and cerebral infarction without residual deficits: Secondary | ICD-10-CM | POA: Insufficient documentation

## 2020-01-01 DIAGNOSIS — Z79899 Other long term (current) drug therapy: Secondary | ICD-10-CM | POA: Insufficient documentation

## 2020-01-01 DIAGNOSIS — I25119 Atherosclerotic heart disease of native coronary artery with unspecified angina pectoris: Secondary | ICD-10-CM | POA: Diagnosis not present

## 2020-01-01 DIAGNOSIS — Z833 Family history of diabetes mellitus: Secondary | ICD-10-CM | POA: Diagnosis not present

## 2020-01-01 DIAGNOSIS — Z888 Allergy status to other drugs, medicaments and biological substances status: Secondary | ICD-10-CM | POA: Insufficient documentation

## 2020-01-01 DIAGNOSIS — I10 Essential (primary) hypertension: Secondary | ICD-10-CM | POA: Diagnosis not present

## 2020-01-01 DIAGNOSIS — Z8249 Family history of ischemic heart disease and other diseases of the circulatory system: Secondary | ICD-10-CM | POA: Diagnosis not present

## 2020-01-01 DIAGNOSIS — R1310 Dysphagia, unspecified: Secondary | ICD-10-CM | POA: Diagnosis not present

## 2020-01-01 DIAGNOSIS — Z9849 Cataract extraction status, unspecified eye: Secondary | ICD-10-CM | POA: Diagnosis not present

## 2020-01-01 DIAGNOSIS — Z806 Family history of leukemia: Secondary | ICD-10-CM | POA: Insufficient documentation

## 2020-01-01 DIAGNOSIS — Z809 Family history of malignant neoplasm, unspecified: Secondary | ICD-10-CM | POA: Insufficient documentation

## 2020-01-01 DIAGNOSIS — Z87891 Personal history of nicotine dependence: Secondary | ICD-10-CM | POA: Diagnosis not present

## 2020-01-01 DIAGNOSIS — M1990 Unspecified osteoarthritis, unspecified site: Secondary | ICD-10-CM | POA: Diagnosis not present

## 2020-01-01 DIAGNOSIS — K449 Diaphragmatic hernia without obstruction or gangrene: Secondary | ICD-10-CM | POA: Insufficient documentation

## 2020-01-01 HISTORY — PX: ESOPHAGOGASTRODUODENOSCOPY: SHX5428

## 2020-01-01 HISTORY — PX: BRAVO PH STUDY: SHX5421

## 2020-01-01 SURGERY — EGD (ESOPHAGOGASTRODUODENOSCOPY)
Anesthesia: Monitor Anesthesia Care

## 2020-01-01 MED ORDER — SODIUM CHLORIDE 0.9 % IV SOLN: INTRAVENOUS | Status: DC

## 2020-01-01 MED ORDER — PROPOFOL 10 MG/ML IV BOLUS
INTRAVENOUS | Status: DC | PRN
  Administered 2020-01-01: 30 mg via INTRAVENOUS

## 2020-01-01 MED ORDER — PROPOFOL 10 MG/ML IV BOLUS
INTRAVENOUS | Status: DC | PRN
  Administered 2020-01-01: 100 ug/kg/min via INTRAVENOUS

## 2020-01-01 MED ORDER — LIDOCAINE 2% (20 MG/ML) 5 ML SYRINGE
INTRAMUSCULAR | Status: DC | PRN
Start: 2020-01-01 — End: 2020-01-01
  Administered 2020-01-01: 100 mg via INTRAVENOUS

## 2020-01-01 MED ORDER — LACTATED RINGERS IV SOLN: INTRAVENOUS | Status: DC

## 2020-01-01 NOTE — Progress Notes (Signed)
Discharge instructions reviewed by different rn's, maggie c, rn, pat f., rn and Haizley Cannella d., rn reviewed instructions with pt concerning bravo multiple times, support given

## 2020-01-01 NOTE — H&P (Signed)
[facility] History and Physical   Primary Care Physician:  Iona Hansen, NP   Reason for Procedure:  Chronic cough, GERD, ILD Plan:    EGD, placement of 48-hour pH Bravo sensor     HPI: Tina [name] is a 86 y.o. female with history of ILD, chronic cough and GERD here for EGD for evaluation of possible GERD related mucosal changes, if negative plan to proceed with placement of 48-hour pH Bravo sensor to assess degree of acid reflux and to correlate with chronic cough and GERD related symptoms.   Past Medical History:  Diagnosis Date  . Canker sore   . Cystocele   . Depression   . Diverticulosis   . Esophageal dysmotility   . Esophageal stricture   . GERD (gastroesophageal reflux disease)   . Heartburn   . Hypertension   . TIA (transient ischemic attack)     Past Surgical History:  Procedure Laterality Date  . APPENDECTOMY    . CATARACT EXTRACTION    . COLON SURGERY    . JOINT REPLACEMENT    . PARTIAL COLECTOMY  09/1991   due to diverticulitis  . SHOULDER SURGERY     right shoulder- removal calcium deposit  . TOTAL HIP ARTHROPLASTY  6/03  . TOTAL HIP ARTHROPLASTY Left 02/24/2016   Procedure: LEFT TOTAL HIP ARTHROPLASTY ANTERIOR APPROACH;  Surgeon: [name], MD;  Location: [facility];  Service: Orthopedics;  Laterality: Left;    Prior to Admission medications   Medication Sig Start Date End Date Taking? Authorizing Provider  acetaminophen (TYLENOL) 500 MG tablet Take 500 mg by mouth every 6 (six) hours as needed for mild pain, moderate pain, fever or headache.   Yes [provider]  albuterol (VENTOLIN HFA) 108 (90 Base) MCG/ACT inhaler Inhale 2 puffs into the lungs every 6 (six) hours as needed for wheezing or shortness of breath.  10/15/19  Yes [provider]  atorvastatin (LIPITOR) 10 MG tablet Take 10 mg by mouth daily at 6 PM.    Yes [provider]  BIOTIN PO Take 1 tablet by mouth daily.   Yes [provider]   Calcium Carbonate-Vitamin D (CALCIUM 600+D) 600-400 MG-UNIT tablet Take 1 tablet by mouth daily.   Yes [provider]  cholecalciferol (VITAMIN D) 1000 units tablet Take 1,000 Units by mouth every other day.   Yes [provider]  ciprofloxacin (CIPRO) 250 MG tablet Take 250 mg by mouth 2 (two) times daily as needed (uti symptoms). For uti 08/12/19  Yes [provider]  citalopram (CELEXA) 20 MG tablet Take 20 mg by mouth daily.     Yes [provider]  famotidine (PEPCID) 20 MG tablet Take 1 tablet (20 mg total) by mouth 2 (two) times daily. Patient taking differently: Take 20 mg by mouth daily.  11/25/19  Yes Yorel Redder, Eleonore Chiquito, MD  HYDROcodone-homatropine (HYCODAN) 5-1.5 MG/5ML syrup Take 5 mLs by mouth every 6 (six) hours as needed for cough. 09/05/19  Yes Icard, Bradley L, DO  losartan (COZAAR) 25 MG tablet Take 1 tablet (25 mg total) by mouth daily. 11/20/19 02/18/20 Yes Nahser, Deloris Ping, MD  Multiple Vitamin (MULTIVITAMIN WITH MINERALS) TABS tablet Take 1 tablet by mouth 3 (three) times a week.   Yes [provider]  Polyethyl Glycol-Propyl Glycol (SYSTANE OP) Place 1 drop into both eyes daily as needed (dry eyes/ allergies).   Yes [provider]  polyethylene glycol (MIRALAX / GLYCOLAX) packet Take 17 g by  mouth daily as needed for mild constipation.   Yes [provider]  zolpidem (AMBIEN) 5 MG tablet Take 2.5 mg by mouth at bedtime as needed for sleep.    Yes [provider]    No current facility-administered medications for this encounter.   Current Outpatient Medications  Medication Sig Dispense Refill  . acetaminophen (TYLENOL) 500 MG tablet Take 500 mg by mouth every 6 (six) hours as needed for mild pain, moderate pain, fever or headache.    . albuterol (VENTOLIN HFA) 108 (90 Base) MCG/ACT inhaler Inhale 2 puffs into the lungs every 6 (six) hours as needed for wheezing or shortness of breath.     Marland Kitchen atorvastatin  (LIPITOR) 10 MG tablet Take 10 mg by mouth daily at 6 PM.     . BIOTIN PO Take 1 tablet by mouth daily.    . Calcium Carbonate-Vitamin D (CALCIUM 600+D) 600-400 MG-UNIT tablet Take 1 tablet by mouth daily.    . cholecalciferol (VITAMIN D) 1000 units tablet Take 1,000 Units by mouth every other day.    . ciprofloxacin (CIPRO) 250 MG tablet Take 250 mg by mouth 2 (two) times daily as needed (uti symptoms). For uti    . citalopram (CELEXA) 20 MG tablet Take 20 mg by mouth daily.      . famotidine (PEPCID) 20 MG tablet Take 1 tablet (20 mg total) by mouth 2 (two) times daily. (Patient taking differently: Take 20 mg by mouth daily. ) 60 tablet 3  . HYDROcodone-homatropine (HYCODAN) 5-1.5 MG/5ML syrup Take 5 mLs by mouth every 6 (six) hours as needed for cough. 473 mL 0  . losartan (COZAAR) 25 MG tablet Take 1 tablet (25 mg total) by mouth daily. 90 tablet 3  . Multiple Vitamin (MULTIVITAMIN WITH MINERALS) TABS tablet Take 1 tablet by mouth 3 (three) times a week.    Bertram Gala Glycol-Propyl Glycol (SYSTANE OP) Place 1 drop into both eyes daily as needed (dry eyes/ allergies).    . polyethylene glycol (MIRALAX / GLYCOLAX) packet Take 17 g by mouth daily as needed for mild constipation.    Marland Kitchen zolpidem (AMBIEN) 5 MG tablet Take 2.5 mg by mouth at bedtime as needed for sleep.       Allergies as of 11/25/2019 - Review Complete 11/25/2019  Allergen Reaction Noted  . Valdecoxib Other (See Comments) 02/15/2016  . Prednisone Rash     Family History  Problem Relation Age of Onset  . Cancer Paternal Aunt   . Cancer Paternal Uncle   . Diabetes Father   . Heart disease Father   . Cancer Father   . Diabetes Sister   . Heart disease Sister   . Leukemia Sister   . Heart disease Brother     Social History   Socioeconomic History  . Marital status: Married    Spouse name: Not on file  . Number of children: Not on file  . Years of education: Not on file  . Highest education level: Not on file   Occupational History  . Not on file  Tobacco Use  . Smoking status: Former Smoker    Packs/day: 0.50    Years: 15.00    Pack years: 7.50    Types: Cigarettes    Quit date: 08/02/1963    Years since quitting: 56.4  . Smokeless tobacco: Never Used  Substance and Sexual Activity  . Alcohol use: No  . Drug use: No  . Sexual activity: Not on file  Other Topics  Concern  . Not on file  Social History Narrative  . Not on file   Social Determinants of Health   Financial Resource Strain:   . Difficulty of Paying Living Expenses:   Food Insecurity:   . Worried About Charity fundraiser in the Last Year:   . Arboriculturist in the Last Year:   Transportation Needs:   . Film/video editor (Medical):   Marland Kitchen Lack of Transportation (Non-Medical):   Physical Activity:   . Days of Exercise per Week:   . Minutes of Exercise per Session:   Stress:   . Feeling of Stress :   Social Connections:   . Frequency of Communication with Friends and Family:   . Frequency of Social Gatherings with Friends and Family:   . Attends Religious Services:   . Active Member of Clubs or Organizations:   . Attends Archivist Meetings:   Marland Kitchen Marital Status:   Intimate Partner Violence:   . Fear of Current or Ex-Partner:   . Emotionally Abused:   Marland Kitchen Physically Abused:   . Sexually Abused:     Review of Systems:  All other review of systems negative except as mentioned in the HPI.  Physical Exam: Vital signs in last 24 hours:     General:   Alert,  Well-developed, well-nourished, pleasant and cooperative in NAD Lungs:  Clear throughout to auscultation.   Heart:  Regular rate and rhythm; no murmurs, clicks, rubs,  or gallops. Abdomen:  Soft, nontender and nondistended. Normal bowel sounds.   Neuro/Psych:  Alert and cooperative. Normal mood and affect. A and O x 3   K. Denzil Magnuson , MD [phone]

## 2020-01-01 NOTE — Discharge Instructions (Signed)
YOU HAD AN ENDOSCOPIC PROCEDURE TODAY: Refer to the procedure report and other information in the discharge instructions given to you for any specific questions about what was found during the examination. If this information does not answer your questions, please call [place] office at [phone] to clarify.   YOU SHOULD EXPECT: Some feelings of bloating in the abdomen. Passage of more gas than usual. Walking can help get rid of the air that was put into your GI tract during the procedure and reduce the bloating. If you had a lower endoscopy (such as a colonoscopy or flexible sigmoidoscopy) you may notice spotting of blood in your stool or on the toilet paper. Some abdominal soreness may be present for a day or two, also.  DIET: Your first meal following the procedure should be a light meal and then it is ok to progress to your normal diet. A half-sandwich or bowl of soup is an example of a good first meal. Heavy or fried foods are harder to digest and may make you feel nauseous or bloated. Drink plenty of fluids but you should avoid alcoholic beverages for 24 hours. If you had a esophageal dilation, please see attached instructions for diet.    ACTIVITY: Your care partner should take you home directly after the procedure. You should plan to take it easy, moving slowly for the rest of the day. You can resume normal activity the day after the procedure however YOU SHOULD NOT DRIVE, use power tools, machinery or perform tasks that involve climbing or major physical exertion for 24 hours (because of the sedation medicines used during the test).   SYMPTOMS TO REPORT IMMEDIATELY: A gastroenterologist can be reached at any hour. Please call [phone]  for any of the following symptoms:   Following upper endoscopy (EGD, EUS, ERCP, esophageal dilation) Vomiting of blood or coffee ground material  New, significant abdominal pain  New, significant chest pain or pain under the shoulder blades  Painful or  persistently difficult swallowing  New shortness of breath  Black, tarry-looking or red, bloody stools  FOLLOW UP:  If any biopsies were taken you will be contacted by phone or by letter within the next 1-3 weeks. Call [phone]  if you have not heard about the biopsies in 3 weeks.  Please also call with any specific questions about appointments or follow up tests.  

## 2020-01-01 NOTE — Anesthesia Preprocedure Evaluation (Signed)
Anesthesia Evaluation  Patient identified by MRN, date of birth, ID band Patient awake    Reviewed: Allergy & Precautions, NPO status , Patient's Chart, lab work & pertinent test results, reviewed documented beta blocker date and time   Airway Mallampati: II  TM Distance: >3 FB Neck ROM: Full    Dental no notable dental hx. (+) Teeth Intact, Caps   Pulmonary former smoker,    Pulmonary exam normal breath sounds clear to auscultation       Cardiovascular hypertension, Pt. on medications + angina with exertion + CAD  Normal cardiovascular exam Rhythm:Regular Rate:Normal     Neuro/Psych PSYCHIATRIC DISORDERS Depression TIA   GI/Hepatic Neg liver ROS, GERD  Poorly Controlled and Medicated,Dysphagia   Endo/Other  negative endocrine ROS  Renal/GU negative Renal ROS  negative genitourinary   Musculoskeletal  (+) Arthritis , Osteoarthritis,    Abdominal   Peds  Hematology negative hematology ROS (+)   Anesthesia Other Findings   Reproductive/Obstetrics                             Anesthesia Physical Anesthesia Plan  ASA: II  Anesthesia Plan: MAC   Post-op Pain Management:    Induction: Intravenous  PONV Risk Score and Plan: 2 and Ondansetron, Propofol infusion and Treatment may vary due to age or medical condition  Airway Management Planned: Natural Airway and Nasal Cannula  Additional Equipment:   Intra-op Plan:   Post-operative Plan:   Informed Consent: I have reviewed the patients History and Physical, chart, labs and discussed the procedure including the risks, benefits and alternatives for the proposed anesthesia with the patient or authorized representative who has indicated his/her understanding and acceptance.     Dental advisory given  Plan Discussed with: CRNA and Surgeon  Anesthesia Plan Comments:         Anesthesia Quick Evaluation

## 2020-01-01 NOTE — Op Note (Signed)
[facility] Patient Name: Tina [name] Procedure Date: 01/01/2020 MRN: [id number] Attending MD: Napoleon Form , MD Date of Birth: February 12, 1931 CSN: [id number] Age: 86 Admit Type: Outpatient Procedure:                Upper GI endoscopy Indications:              Exclusion of esophageal reflux. Chronic cough Providers:                Napoleon Form, MD, Dwain Sarna, RN, Everardo Pacific, Technician Referring MD:              Medicines:                Monitored Anesthesia Care Complications:            No immediate complications. Estimated Blood Loss:     Estimated blood loss: none. Procedure:                Pre-Anesthesia Assessment:                           - Prior to the procedure, a History and Physical                            was performed, and patient medications and                            allergies were reviewed. The patient's tolerance of                            previous anesthesia was also reviewed. The risks                            and benefits of the procedure and the sedation                            options and risks were discussed with the patient.                            All questions were answered, and informed consent                            was obtained. Prior Anticoagulants: The patient has                            taken no previous anticoagulant or antiplatelet                            agents. ASA Grade Assessment: III - A patient with                            severe systemic disease. After reviewing the risks  and benefits, the patient was deemed in                            satisfactory condition to undergo the procedure.                           After obtaining informed consent, the endoscope was                            passed under direct vision. Throughout the                            procedure, the patient's blood pressure, pulse, and          oxygen saturations were monitored continuously. The                            GIF-H190 ([id number]) Olympus gastroscope was                            introduced through the mouth, and advanced to the                            second part of duodenum. The upper GI endoscopy was                            accomplished without difficulty. The patient                            tolerated the procedure well. Scope In: Scope Out: Findings:      The Z-line was regular and was found 38 cm from the incisors. The BRAVO       capsule with delivery system was introduced through the mouth and       advanced into the esophagus, such that the BRAVO pH capsule was       positioned 32 cm from the incisors, which was 6 cm proximal to the GE       junction. The Bravo pH capsule did not deploy and attach the first time.       The BRAVO pH capsule was then deployed and attached to the esophageal       mucosa. The delivery system was then withdrawn. Endoscopy was utilized       for probe placement and diagnostic evaluation.      A small hiatal hernia was present.      The exam of the esophagus was otherwise normal.      The stomach was normal.      The examined duodenum was normal. Impression:               - Z-line regular, 38 cm from the incisors.                           - Small hiatal hernia.                           - Normal stomach.                           -  Normal examined duodenum.                           - The BRAVO pH capsule was deployed.                           - No specimens collected. Moderate Sedation:      Not Applicable - Patient had care per Anesthesia. Recommendation:           - Patient has a contact number available for                            emergencies. The signs and symptoms of potential                            delayed complications were discussed with the                            patient. Return to normal activities tomorrow.                             Written discharge instructions were provided to the                            patient.                           - Resume previous diet.                           - Continue present medications. Hold Pepcid for                            next 2 days                           - See the other procedure note (Bravo pH) for                            documentation of additional recommendations. Procedure Code(s):        --- Professional ---                           [phone], Esophagogastroduodenoscopy, flexible,                            transoral; diagnostic, including collection of                            specimen(s) by brushing or washing, when performed                            (separate procedure) Diagnosis Code(s):        --- Professional ---                           K44.9, Diaphragmatic hernia without  obstruction or                            gangrene CPT copyright 2019 [employer]. All rights reserved. The codes documented in this report are preliminary and upon coder review may  be revised to meet current compliance requirements. Napoleon Form, MD 01/01/2020 3:06:58 PM This report has been signed electronically. Number of Addenda: 0

## 2020-01-01 NOTE — Anesthesia Postprocedure Evaluation (Signed)
Anesthesia Post Note  Patient: Tina [name]  Procedure(s) Performed: ESOPHAGOGASTRODUODENOSCOPY (EGD) (N/A ) BRAVO PH STUDY- 48hrs (N/A )     Patient location during evaluation: PACU Anesthesia Type: MAC Level of consciousness: awake and alert and oriented Pain management: pain level controlled Vital Signs Assessment: post-procedure vital signs reviewed and stable Respiratory status: spontaneous breathing, nonlabored ventilation and respiratory function stable Cardiovascular status: stable and blood pressure returned to baseline Postop Assessment: no apparent nausea or vomiting Anesthetic complications: no    Last Vitals:  Vitals:   01/01/20 1500 01/01/20 1510  BP: (!) 160/51 (!) 161/49  Pulse: 71 70  Resp: 19 14  Temp:    SpO2: 97% 99%    Last Pain:  Vitals:   01/01/20 1500  TempSrc:   PainSc: 0-No pain                 [place] Jeff A.

## 2020-01-01 NOTE — Transfer of Care (Signed)
Immediate Anesthesia Transfer of Care Note  Patient: Tina [name]  Procedure(s) Performed: ESOPHAGOGASTRODUODENOSCOPY (EGD) (N/A ) BRAVO PH STUDY- 48hrs (N/A )  Patient Location: PACU  Anesthesia Type:MAC  Level of Consciousness: drowsy  Airway & Oxygen Therapy: Patient Spontanous Breathing and Patient connected to face mask oxygen  Post-op Assessment: Report given to RN and Post -op Vital signs reviewed and stable  Post vital signs: Reviewed and stable  Last Vitals:  Vitals Value Taken Time  BP 125/75 01/01/20 1445  Temp    Pulse 90 01/01/20 1446  Resp 21 01/01/20 1446  SpO2 99 % 01/01/20 1446  Vitals shown include unvalidated device data.  Last Pain:  Vitals:   01/01/20 1227  TempSrc: Oral  PainSc: 0-No pain         Complications: No apparent anesthesia complications

## 2020-01-03 ENCOUNTER — Encounter: Payer: Self-pay | Admitting: *Deleted

## 2020-01-09 ENCOUNTER — Other Ambulatory Visit: Payer: Self-pay | Admitting: Cardiovascular Disease

## 2020-01-09 ENCOUNTER — Ambulatory Visit (INDEPENDENT_AMBULATORY_CARE_PROVIDER_SITE_OTHER): Payer: Medicare Other

## 2020-01-09 ENCOUNTER — Other Ambulatory Visit: Payer: Self-pay | Admitting: *Deleted

## 2020-01-09 ENCOUNTER — Encounter: Payer: Self-pay | Admitting: *Deleted

## 2020-01-09 ENCOUNTER — Other Ambulatory Visit: Payer: Self-pay

## 2020-01-09 DIAGNOSIS — I209 Angina pectoris, unspecified: Secondary | ICD-10-CM

## 2020-01-09 DIAGNOSIS — R42 Dizziness and giddiness: Secondary | ICD-10-CM

## 2020-01-09 DIAGNOSIS — R002 Palpitations: Secondary | ICD-10-CM | POA: Diagnosis not present

## 2020-01-09 NOTE — Progress Notes (Signed)
Patient ID: Tina [name], female   DOB: 11-May-1931, 86 y.o.   MRN: [id number] Patient postponed applying 14 day ZIO AT monitor due to Endoscopy. 14 day ZIO AT long term monitor -Live Telemetry mailed to patient 11/28/2019 and applied in office 01/09/2020.

## 2020-02-16 ENCOUNTER — Encounter: Payer: Self-pay | Admitting: Cardiovascular Disease

## 2020-02-16 NOTE — Progress Notes (Signed)
Cardiology Office Note:    Date:  02/17/2020   ID:  Tina [name], DOB 12-10-1930, MRN [id number]  PCP:  Tina Hansen, NP  Cardiologist:  Tina [name]  Electrophysiologist:  None   Referring MD: Tina Hansen, NP   Chief Complaint  Patient presents with  . Shortness of Breath  . Palpitations  I   :    Tina [name] is a 86 y.o. female with a hx of CAD.    She was referred to me by Dr. Yetta [name] for further management of her CAD  Seen with her daughter, Tina [name].   She is seen frequently by the pulmonary group for her ILD. I saw her about 10 years ago   She fell and broke her arm last year. The CT scan showed interstitial lung disease.  She was incidentally found to have coronary artery calcifications   Does have some intrascapular CP with walking or when she gets tired.   Resolves with rest  No syncope   Has a chronic cough Does not do any regular exercise.   Does her house work .  Has occasional falling episdoses from dizziness and being unsteady.  Has symptoms c/w orthostasis.   February 17, 2020  [name] presents for follow up of her CP, hypertipidemia lexiscan myoview was negative for ishemia and revealed normal LV function 30 day event monitor revealed no significant arrhythmais   No recent episodes of CP  Still has some intrascapular pain - mostly in the late afternoon when she is getting tired.  Does not seem to be related to exertion .     Past Medical History:  Diagnosis Date  . Canker sore   . Cystocele   . Depression   . Diverticulosis   . Esophageal dysmotility   . Esophageal stricture   . GERD (gastroesophageal reflux disease)   . Heartburn   . Hypertension   . TIA (transient ischemic attack)     Past Surgical History:  Procedure Laterality Date  . APPENDECTOMY    . BRAVO PH STUDY N/A 01/01/2020   Procedure: BRAVO PH STUDY- 48hrs;  Surgeon: Napoleon Form, MD;  Location: [facility];  Service: Endoscopy;  Laterality: N/A;  . CATARACT  EXTRACTION    . COLON SURGERY    . ESOPHAGOGASTRODUODENOSCOPY N/A 01/01/2020   Procedure: ESOPHAGOGASTRODUODENOSCOPY (EGD);  Surgeon: Napoleon Form, MD;  Location: [facility];  Service: Endoscopy;  Laterality: N/A;  . JOINT REPLACEMENT    . PARTIAL COLECTOMY  09/1991   due to diverticulitis  . SHOULDER SURGERY     right shoulder- removal calcium deposit  . TOTAL HIP ARTHROPLASTY  6/03  . TOTAL HIP ARTHROPLASTY Left 02/24/2016   Procedure: LEFT TOTAL HIP ARTHROPLASTY ANTERIOR APPROACH;  Surgeon: [name], MD;  Location: [facility];  Service: Orthopedics;  Laterality: Left;    Current Medications: Current Meds  Medication Sig  . acetaminophen (TYLENOL) 500 MG tablet Take 500 mg by mouth every 6 (six) hours as needed for mild pain, moderate pain, fever or headache.  . albuterol (VENTOLIN HFA) 108 (90 Base) MCG/ACT inhaler Inhale 2 puffs into the lungs every 6 (six) hours as needed for wheezing or shortness of breath.   Marland Kitchen atorvastatin (LIPITOR) 10 MG tablet Take 10 mg by mouth daily at 6 PM.   . BIOTIN PO Take 1 tablet by mouth daily.  . Calcium Carbonate-Vitamin D (CALCIUM 600+D) 600-400 MG-UNIT tablet Take 1 tablet by mouth daily.  . cholecalciferol (VITAMIN D) 1000 units  tablet Take 1,000 Units by mouth every other day.  . ciprofloxacin (CIPRO) 250 MG tablet Take 250 mg by mouth 2 (two) times daily as needed (uti symptoms). For uti  . citalopram (CELEXA) 20 MG tablet Take 20 mg by mouth daily.    . famotidine (PEPCID) 20 MG tablet TAKE 1 TABLET BY MOUTH TWICE A DAY  . HYDROcodone-homatropine (HYCODAN) 5-1.5 MG/5ML syrup Take 5 mLs by mouth every 6 (six) hours as needed for cough.  . losartan (COZAAR) 25 MG tablet Take 1 tablet (25 mg total) by mouth daily.  . Multiple Vitamin (MULTIVITAMIN WITH MINERALS) TABS tablet Take 1 tablet by mouth 3 (three) times a week.  Bertram Gala. Polyethyl Glycol-Propyl Glycol (SYSTANE OP) Place 1 drop into both eyes daily as needed (dry eyes/ allergies).  .  polyethylene glycol (MIRALAX / GLYCOLAX) packet Take 17 g by mouth daily as needed for mild constipation.  Marland Kitchen. zolpidem (AMBIEN) 5 MG tablet Take 2.5 mg by mouth at bedtime as needed for sleep.   . [DISCONTINUED] losartan (COZAAR) 25 MG tablet Take 1 tablet (25 mg total) by mouth daily.     Allergies:   Valdecoxib and Prednisone   Social History   Socioeconomic History  . Marital status: Married    Spouse name: Not on file  . Number of children: Not on file  . Years of education: Not on file  . Highest education level: Not on file  Occupational History  . Not on file  Tobacco Use  . Smoking status: Former Smoker    Packs/day: 0.50    Years: 15.00    Pack years: 7.50    Types: Cigarettes    Quit date: 08/02/1963    Years since quitting: 56.5  . Smokeless tobacco: Never Used  Vaping Use  . Vaping Use: Never used  Substance and Sexual Activity  . Alcohol use: No  . Drug use: No  . Sexual activity: Not on file  Other Topics Concern  . Not on file  Social History Narrative  . Not on file   Social Determinants of Health   Financial Resource Strain:   . Difficulty of Paying Living Expenses:   Food Insecurity:   . Worried About Programme researcher, broadcasting/film/videounning Out of Food in the Last Year:   . Baristaan Out of Food in the Last Year:   Transportation Needs:   . Freight forwarderLack of Transportation (Medical):   Marland Kitchen. Lack of Transportation (Non-Medical):   Physical Activity:   . Days of Exercise per Week:   . Minutes of Exercise per Session:   Stress:   . Feeling of Stress :   Social Connections:   . Frequency of Communication with Friends and Family:   . Frequency of Social Gatherings with Friends and Family:   . Attends Religious Services:   . Active Member of Clubs or Organizations:   . Attends BankerClub or Organization Meetings:   Marland Kitchen. Marital Status:      Family History: The patient's family history includes Cancer in her father, paternal aunt, and paternal uncle; Diabetes in her father and sister; Heart disease in her  brother, father, and sister; Leukemia in her sister.  ROS:   Please see the history of present illness.     All other systems reviewed and are negative.  EKGs/Labs/Other Studies Reviewed:    The following studies were reviewed today:    Recent Labs: 02/17/2020: ALT 10; BUN 25; Creatinine, Ser 0.86; Potassium 4.5; Sodium 141  Recent Lipid Panel    Component  Value Date/Time   CHOL 157 02/17/2020 1027   TRIG 75 02/17/2020 1027   HDL 59 02/17/2020 1027   CHOLHDL 2.7 02/17/2020 1027   LDLCALC 84 02/17/2020 1027    Physical Exam:     Physical Exam: Blood pressure (!) 132/50, pulse 70, height 5\' 4"  (1.626 m), weight 146 lb 9.6 oz (66.5 kg), SpO2 98 %.  GEN:  Elderly female,  NAD  HEENT: Normal NECK: No JVD; No carotid bruits LYMPHATICS: No lymphadenopathy CARDIAC: RRR , no murmurs, rubs, gallops RESPIRATORY:  Clear to auscultation without rales, wheezing or rhonchi  ABDOMEN: Soft, non-tender, non-distended MUSCULOSKELETAL:  No edema; No deformity  SKIN: Warm and dry NEUROLOGIC:  Alert and oriented x 3   ECG :     ASSESSMENT:    No diagnosis found. PLAN:       1.  Intrascapular pain: She presents with intrascapular pain that typically occurs later in the day when she is fatigued.  Stress Myoview study did not reveal any ischemia.  She has normal left ventricular systolic function.  At this point we will continue to observe.  If her symptoms worsen she will call for further advice.  We may need to do a heart catheterization if her symptoms worsen.   2.   Hypertension: .  Blood pressure seems to be well controlled.  3.  . Hyperlipidemia: Continue atorvastatin.  Check lipids, liver enzymes, basic metabolic profile.today      Medication Adjustments/Labs and Tests Ordered: Current medicines are reviewed at length with the patient today.  Concerns regarding medicines are outlined above.  No orders of the defined types were placed in this encounter.  Meds ordered this  encounter  Medications  . losartan (COZAAR) 25 MG tablet    Sig: Take 1 tablet (25 mg total) by mouth daily.    Dispense:  90 tablet    Refill:  3     Patient Instructions  Medication Instructions:  Your physician recommends that you continue on your current medications as directed. Please refer to the Current Medication list given to you today.  *If you need a refill on your cardiac medications before your next appointment, please call your pharmacy*   Lab Work: Today lipid, liver, bmet If you have labs (blood work) drawn today and your tests are completely normal, you will receive your results only by: MyChart Message (if you have MyChart) OR . A paper copy in the mail If you have any lab test that is abnormal or we need to change your treatment, we will call you to review the results.   Testing/Procedures: None ordered   Follow-Up: At [facility], you and your health needs are our priority.  As part of our continuing mission to provide you with exceptional heart care, we have created designated Provider Care Teams.  These Care Teams include your primary Cardiologist (physician) and Advanced Practice Providers (APPs -  Physician Assistants and Nurse Practitioners) who all work together to provide you with the care you need, when you need it.  We recommend signing up for the patient portal called "MyChart".  Sign up information is provided on this After Visit Summary.  MyChart is used to connect with patients for Virtual Visits (Telemedicine).  Patients are able to view lab/test results, encounter notes, upcoming appointments, etc.  Non-urgent messages can be sent to your provider as well.   To learn more about what you can do with MyChart, go to [facility].    Your next appointment:  6 month(s)  The format for your next appointment:   In Person  Provider:   You may see Leodis Sias, MD or one of the following Advanced Practice Providers on your  designated Care Team:    Tereso Newcomer, PA-C  Chelsea Aus, [place]        Signed, Kristeen Miss, MD  02/17/2020 5:53 PM    [facility]

## 2020-02-17 ENCOUNTER — Other Ambulatory Visit: Payer: Self-pay | Admitting: Gastroenterology

## 2020-02-17 ENCOUNTER — Ambulatory Visit: Payer: Medicare Other | Admitting: Cardiovascular Disease

## 2020-02-17 ENCOUNTER — Encounter: Payer: Self-pay | Admitting: Cardiovascular Disease

## 2020-02-17 ENCOUNTER — Other Ambulatory Visit: Payer: Self-pay

## 2020-02-17 ENCOUNTER — Other Ambulatory Visit: Payer: Medicare Other | Admitting: *Deleted

## 2020-02-17 VITALS — BP 132/50 | HR 70 | Ht 64.0 in | Wt 146.6 lb

## 2020-02-17 DIAGNOSIS — I2510 Atherosclerotic heart disease of native coronary artery without angina pectoris: Secondary | ICD-10-CM

## 2020-02-17 DIAGNOSIS — I10 Essential (primary) hypertension: Secondary | ICD-10-CM | POA: Insufficient documentation

## 2020-02-17 DIAGNOSIS — E785 Hyperlipidemia, unspecified: Secondary | ICD-10-CM

## 2020-02-17 DIAGNOSIS — I2584 Coronary atherosclerosis due to calcified coronary lesion: Secondary | ICD-10-CM

## 2020-02-17 LAB — LIPID PANEL
Chol/HDL Ratio: 2.7 ratio (ref 0.0–4.4)
Cholesterol, Total: 157 mg/dL (ref 100–199)
HDL: 59 mg/dL (ref >39)
LDL Chol Calc (NIH): 84 mg/dL (ref 0–99)
Triglycerides: 75 mg/dL (ref 0–149)
VLDL Cholesterol Cal: 14 mg/dL (ref 5–40)

## 2020-02-17 LAB — BASIC METABOLIC PANEL
BUN/Creatinine Ratio: 29 — ABNORMAL HIGH (ref 12–28)
BUN: 25 mg/dL (ref 8–27)
CO2: 25 mmol/L (ref 20–29)
Calcium: 9.2 mg/dL (ref 8.7–10.3)
Chloride: 104 mmol/L (ref 96–106)
Creatinine, Ser: 0.86 mg/dL (ref 0.57–1.00)
GFR calc Af Amer: 71 mL/min/{1.73_m2} (ref >59)
GFR calc non Af Amer: 61 mL/min/{1.73_m2} (ref >59)
Glucose: 89 mg/dL (ref 65–99)
Potassium: 4.5 mmol/L (ref 3.5–5.2)
Sodium: 141 mmol/L (ref 134–144)

## 2020-02-17 LAB — HEPATIC FUNCTION PANEL
ALT: 10 IU/L (ref 0–32)
AST: 16 IU/L (ref 0–40)
Albumin: 4.2 g/dL (ref 3.6–4.6)
Alkaline Phosphatase: 91 IU/L (ref 48–121)
Bilirubin Total: 0.4 mg/dL (ref 0.0–1.2)
Bilirubin, Direct: 0.13 mg/dL (ref 0.00–0.40)
Total Protein: 6.5 g/dL (ref 6.0–8.5)

## 2020-02-17 MED ORDER — LOSARTAN POTASSIUM 25 MG PO TABS: 25.0000 mg | ORAL_TABLET | Freq: Every day | ORAL | 3 refills | Status: DC

## 2020-02-17 NOTE — Patient Instructions (Signed)
Medication Instructions:  Your physician recommends that you continue on your current medications as directed. Please refer to the Current Medication list given to you today.  *If you need a refill on your cardiac medications before your next appointment, please call your pharmacy*   Lab Work: Today lipid, liver, bmet If you have labs (blood work) drawn today and your tests are completely normal, you will receive your results only by: Marland Kitchen MyChart Message (if you have MyChart) OR . A paper copy in the mail If you have any lab test that is abnormal or we need to change your treatment, we will call you to review the results.   Testing/Procedures: None ordered   Follow-Up: At [facility], you and your health needs are our priority.  As part of our continuing mission to provide you with exceptional heart care, we have created designated Provider Care Teams.  These Care Teams include your primary Cardiologist (physician) and Advanced Practice Providers (APPs -  Physician Assistants and Nurse Practitioners) who all work together to provide you with the care you need, when you need it.  We recommend signing up for the patient portal called "MyChart".  Sign up information is provided on this After Visit Summary.  MyChart is used to connect with patients for Virtual Visits (Telemedicine).  Patients are able to view lab/test results, encounter notes, upcoming appointments, etc.  Non-urgent messages can be sent to your provider as well.   To learn more about what you can do with MyChart, go to [email or web address].    Your next appointment:   6 month(s)  The format for your next appointment:   In Person  Provider:   You may see Leodis Sias, MD or one of the following Advanced Practice Providers on your designated Care Team:    Tereso Newcomer, PA-C  [place], [place]

## 2020-03-24 ENCOUNTER — Encounter: Payer: Self-pay | Admitting: Pulmonary Disease

## 2020-03-24 ENCOUNTER — Other Ambulatory Visit: Payer: Self-pay

## 2020-03-24 ENCOUNTER — Ambulatory Visit: Payer: Medicare Other | Admitting: Pulmonary Disease

## 2020-03-24 DIAGNOSIS — J849 Interstitial pulmonary disease, unspecified: Secondary | ICD-10-CM

## 2020-03-24 NOTE — Patient Instructions (Addendum)
I am glad that you are doing well with regard to your breathing We will get a high-resolution CT chest in January 2022 for evaluation of your lung Follow-up in clinic after CT chest in January.

## 2020-03-24 NOTE — Progress Notes (Signed)
Tina [name]    [id number]    Dec 28, 1930  Primary Care Physician:Jones, Letha Cape, NP  Referring Physician: Iona Hansen, NP [address] [place],  [place] [address]  Chief complaint: Follow up for interstitial lung disease   HPI: 86 year old with esophageal dysmotility, stricture, GERD, hypertension, TIA. Seen in ER in June 2020 for fall with CT head, spine series showing upper lobe infiltrate.  She had a follow-up CT here in January 2021 which showed resolution of the infiltrate.  However there is mild interstitial changes at the bases and has been referred here for further evaluation.  Has mild dyspnea on exertion, denies any cough, sputum production, fevers, chills.  Has occasional difficulty swallowing, choking on food and liquids.  Has joint stiffness, dry mouth, eyes.  No Raynaud's phenomena. History notable for esophageal dysmotility, stricture s/p dilatation.  She has previously seen Dr. Juanda Chance at [facility] but has not followed up since 2012.  Pets: No pets Occupation: Housewife ILD questionnaire 10/11/2018: Notes occasional mold in the shower.  No hot tub, Jacuzzi, humidifier.  She does have a down pillow.   Smoking history: 8-pack-year smoker.  Quit in 1965. Travel history: No significant travel Relevant family history: No significant family history of lung disease  Interim history: States her breathing is stable Has followed up with Dr. Lavon Paganini, GI with EGD showing no significant abnormality.  Outpatient Encounter Medications as of 03/24/2020  Medication Sig  . acetaminophen (TYLENOL) 500 MG tablet Take 500 mg by mouth every 6 (six) hours as needed for mild pain, moderate pain, fever or headache.  . albuterol (VENTOLIN HFA) 108 (90 Base) MCG/ACT inhaler Inhale 2 puffs into the lungs every 6 (six) hours as needed for wheezing or shortness of breath.   Marland Kitchen atorvastatin (LIPITOR) 10 MG tablet Take 10 mg by mouth daily at 6 PM.   . BIOTIN PO Take 1  tablet by mouth daily.  . Calcium Carbonate-Vitamin D (CALCIUM 600+D) 600-400 MG-UNIT tablet Take 1 tablet by mouth daily.  . cholecalciferol (VITAMIN D) 1000 units tablet Take 1,000 Units by mouth every other day.  . ciprofloxacin (CIPRO) 250 MG tablet Take 250 mg by mouth 2 (two) times daily as needed (uti symptoms). For uti  . citalopram (CELEXA) 20 MG tablet Take 20 mg by mouth daily.    . famotidine (PEPCID) 20 MG tablet TAKE 1 TABLET BY MOUTH TWICE A DAY  . HYDROcodone-homatropine (HYCODAN) 5-1.5 MG/5ML syrup Take 5 mLs by mouth every 6 (six) hours as needed for cough.  . losartan (COZAAR) 25 MG tablet Take 1 tablet (25 mg total) by mouth daily.  . Multiple Vitamin (MULTIVITAMIN WITH MINERALS) TABS tablet Take 1 tablet by mouth 3 (three) times a week.  Bertram Gala Glycol-Propyl Glycol (SYSTANE OP) Place 1 drop into both eyes daily as needed (dry eyes/ allergies).  . polyethylene glycol (MIRALAX / GLYCOLAX) packet Take 17 g by mouth daily as needed for mild constipation.  Marland Kitchen zolpidem (AMBIEN) 5 MG tablet Take 2.5 mg by mouth at bedtime as needed for sleep.    No facility-administered encounter medications on file as of 03/24/2020.   Physical Exam: Blood pressure 112/60, pulse 72, temperature 98 F (36.7 C), temperature source Other (Comment), height 5\' 4"  (1.626 m), weight 144 lb 9.6 oz (65.6 kg), SpO2 95 %. Gen:      No acute distress HEENT:  EOMI, sclera anicteric Neck:     No  masses; no thyromegaly Lungs:    Clear to auscultation bilaterally; normal respiratory effort CV:         Regular rate and rhythm; no murmurs Abd:      + bowel sounds; soft, non-tender; no palpable masses, no distension Ext:    No edema; adequate peripheral perfusion Skin:      Warm and dry; no rash Neuro: alert and oriented x 3 Psych: normal mood and affect  Data Reviewed: Imaging: CT head, C-spine 01/29/2019-2 cm hazy opacity in the left upper lobe. CT chest 08/20/2019-left lobe opacity has resolved.   Minimal subpleural reticulation, groundglass opacities in the lower lungs. I have reviewed the images personally.  PFTs: 11/07/2019 FVC 2.66 [119%], FEV1 1.95 [190%], F/F 74, DLCO 12.64 [70%] Mild DLCO impairment  Labs: CTD serologies 09/30/2019-positive for ANA 1:40 cytoplasmic  Assessment:  Interstitial lung disease Reviewed CT scan with very minimal changes at the bases which is nonspecific. Could be secondary to GERD, mild aspiration given history of esophageal dysmotility. She also has down exposure in her pillows but CT scan is not suggestive of hypersensitivity pneumonitis. Nevertheless I have asked her to get rid of her down pillow. CTD serologies with nonspecific elevation in ANA.  No clear evidence of autoimmune process  Her lung changes are very minimal and does not need any aggressive intervention or work-up such as lung biopsy and we can monitor for now.  Plan/Recommendations: - Get rid of down pillow - Follow-up high-res CT in January 2022  Chilton Greathouse MD [facility] 03/24/2020, 4:30 PM  CC: Iona Hansen, NP

## 2020-04-08 ENCOUNTER — Other Ambulatory Visit: Payer: Medicare Other

## 2020-04-08 ENCOUNTER — Other Ambulatory Visit: Payer: Self-pay

## 2020-04-08 DIAGNOSIS — Z20822 Contact with and (suspected) exposure to covid-19: Secondary | ICD-10-CM

## 2020-04-10 LAB — SARS-COV-2, NAA 2 DAY TAT

## 2020-04-10 LAB — NOVEL CORONAVIRUS, NAA: SARS-CoV-2, NAA: NOT DETECTED

## 2020-05-30 ENCOUNTER — Other Ambulatory Visit: Payer: Self-pay

## 2020-05-30 ENCOUNTER — Ambulatory Visit: Payer: Medicare Other | Attending: Internal Medicine

## 2020-05-30 DIAGNOSIS — Z23 Encounter for immunization: Secondary | ICD-10-CM

## 2020-05-30 NOTE — Progress Notes (Signed)
° °  Covid-19 Vaccination Clinic  Name:  GWENDLOYN FORSEE    MRN: [id number] DOB: Apr 30, 1931  05/30/2020  Ms. Crill was observed post Covid-19 immunization for 15 minutes without incident. She was provided with Vaccine Information Sheet and instruction to access the V-Safe system.   Ms. Ethridge was instructed to call 911 with any severe reactions post vaccine:  Difficulty breathing   Swelling of face and throat   A fast heartbeat   A bad rash all over body   Dizziness and weakness

## 2020-07-09 ENCOUNTER — Telehealth: Payer: Self-pay | Admitting: Pulmonary Disease

## 2020-07-10 NOTE — Telephone Encounter (Signed)
I had pt's CT order.  I got CT scheduled and called pt & gave her appt info.  Nothing further needed.

## 2020-08-19 ENCOUNTER — Inpatient Hospital Stay: Admission: RE | Admit: 2020-08-19 | Payer: Medicare Other | Source: Ambulatory Visit

## 2020-08-20 ENCOUNTER — Ambulatory Visit: Payer: Medicare Other | Admitting: Pulmonary Disease

## 2020-09-03 ENCOUNTER — Encounter (INDEPENDENT_AMBULATORY_CARE_PROVIDER_SITE_OTHER): Payer: Self-pay

## 2020-09-03 ENCOUNTER — Other Ambulatory Visit: Payer: Self-pay

## 2020-09-03 ENCOUNTER — Ambulatory Visit (INDEPENDENT_AMBULATORY_CARE_PROVIDER_SITE_OTHER)
Admission: RE | Admit: 2020-09-03 | Discharge: 2020-09-03 | Disposition: A | Discharge status: Home / Self Care | Payer: Medicare Other | Source: Ambulatory Visit | Attending: Pulmonary Disease | Admitting: Pulmonary Disease

## 2020-09-03 DIAGNOSIS — J849 Interstitial pulmonary disease, unspecified: Secondary | ICD-10-CM

## 2020-09-17 ENCOUNTER — Other Ambulatory Visit: Payer: Self-pay

## 2020-09-17 ENCOUNTER — Ambulatory Visit: Payer: Medicare Other | Admitting: Pulmonary Disease

## 2020-09-17 ENCOUNTER — Encounter: Payer: Self-pay | Admitting: Pulmonary Disease

## 2020-09-17 VITALS — BP 114/70 | Temp 97.7°F | Ht 64.0 in | Wt 138.8 lb

## 2020-09-17 DIAGNOSIS — J849 Interstitial pulmonary disease, unspecified: Secondary | ICD-10-CM | POA: Diagnosis not present

## 2020-09-17 DIAGNOSIS — R059 Cough, unspecified: Secondary | ICD-10-CM

## 2020-09-17 MED ORDER — FLUTICASONE PROPIONATE 50 MCG/ACT NA SUSP
1.0000 | Freq: Every day | NASAL | 2 refills | Status: DC
Start: 2020-09-17 — End: 2021-12-07

## 2020-09-17 NOTE — Progress Notes (Signed)
Tina [name]    [id number]    [birth year]  Primary Care Physician:Jones, Letha Cape, NP  Referring Physician: Iona Hansen, NP [address] [place],  [place] [address]  Chief complaint: Follow up for interstitial lung disease   HPI: 87 year old with esophageal dysmotility, stricture, GERD, hypertension, TIA. Seen in ER in June 2020 for fall with CT head, spine series showing upper lobe infiltrate.  She had a follow-up CT here in January 2021 which showed resolution of the infiltrate.  However there is mild interstitial changes at the bases and has been referred here for further evaluation.  Has mild dyspnea on exertion, denies any cough, sputum production, fevers, chills.  Has occasional difficulty swallowing, choking on food and liquids.  Has joint stiffness, dry mouth, eyes.  No Raynaud's phenomena. History notable for esophageal dysmotility, stricture s/p dilatation.   Has followed up with Dr. Lavon Paganini, GI with EGD on 01/01/2020 showing no significant abnormality.  Pets: No pets Occupation: Housewife ILD questionnaire 10/11/2018: Notes occasional mold in the shower.  No hot tub, Jacuzzi, humidifier.  She does have a down pillow.   Smoking history: 8-pack-year smoker.  Quit in 1965. Travel history: No significant travel Relevant family history: No significant family history of lung disease  Interim history: States her breathing is stable Here for review of CT scan  Outpatient Encounter Medications as of 09/17/2020  Medication Sig  . acetaminophen (TYLENOL) 500 MG tablet Take 500 mg by mouth every 6 (six) hours as needed for mild pain, moderate pain, fever or headache.  . albuterol (VENTOLIN HFA) 108 (90 Base) MCG/ACT inhaler Inhale 2 puffs into the lungs every 6 (six) hours as needed for wheezing or shortness of breath.   Marland Kitchen atorvastatin (LIPITOR) 10 MG tablet Take 10 mg by mouth daily at 6 PM.   . BIOTIN PO Take 1 tablet by mouth daily.  . Calcium  Carbonate-Vitamin D 600-400 MG-UNIT tablet Take 1 tablet by mouth daily.  . cholecalciferol (VITAMIN D) 1000 units tablet Take 1,000 Units by mouth every other day.  . ciprofloxacin (CIPRO) 250 MG tablet Take 250 mg by mouth 2 (two) times daily as needed (uti symptoms). For uti  . citalopram (CELEXA) 20 MG tablet Take 20 mg by mouth daily.  Marland Kitchen HYDROcodone-homatropine (HYCODAN) 5-1.5 MG/5ML syrup Take 5 mLs by mouth every 6 (six) hours as needed for cough.  . losartan (COZAAR) 25 MG tablet Take 1 tablet (25 mg total) by mouth daily.  . Multiple Vitamin (MULTIVITAMIN WITH MINERALS) TABS tablet Take 1 tablet by mouth 3 (three) times a week.  Bertram Gala Glycol-Propyl Glycol (SYSTANE OP) Place 1 drop into both eyes daily as needed (dry eyes/ allergies).  . polyethylene glycol (MIRALAX / GLYCOLAX) packet Take 17 g by mouth daily as needed for mild constipation.  Marland Kitchen zolpidem (AMBIEN) 5 MG tablet Take 2.5 mg by mouth at bedtime as needed for sleep.   . famotidine (PEPCID) 20 MG tablet TAKE 1 TABLET BY MOUTH TWICE A DAY (Patient not taking: Reported on 09/17/2020)   No facility-administered encounter medications on file as of 09/17/2020.   Physical Exam: Blood pressure 114/70, temperature 97.7 F (36.5 C), temperature source Temporal, height 5\' 4"  (1.626 m), weight 138 lb 12.8 oz (63 kg), SpO2 100 %. Gen:      No acute distress HEENT:  EOMI, sclera anicteric Neck:     No masses; no thyromegaly Lungs:    Clear  to auscultation bilaterally; normal respiratory effort CV:         Regular rate and rhythm; no murmurs Abd:      + bowel sounds; soft, non-tender; no palpable masses, no distension Ext:    No edema; adequate peripheral perfusion Skin:      Warm and dry; no rash Neuro: alert and oriented x 3 Psych: normal mood and affect  Data Reviewed: Imaging: CT head, C-spine 01/29/2019-2 cm hazy opacity in the left upper lobe.  CT chest 08/20/2019-left lobe opacity has resolved.  Minimal subpleural  reticulation, groundglass opacities in the lower lungs.  High-res CT 09/03/2020-minimal peripheral interstitial opacities which is improved.  No evidence of ILD.  I have reviewed the images personally.  PFTs: 11/07/2019 FVC 2.66 [119%], FEV1 1.95 [190%], F/F 74, DLCO 12.64 [70%] Mild DLCO impairment  Labs: CTD serologies 09/30/2019-positive for ANA 1:40 cytoplasmic  Assessment:  Interstitial lung disease Reviewed CT scan with very minimal changes at the bases which is nonspecific. Could be secondary to GERD, mild aspiration given history of esophageal dysmotility. She also has down exposure in her pillows in the past but CT scan is not suggestive of hypersensitivity pneumonitis.  She is no longer exposed to feathers CTD serologies with nonspecific elevation in ANA.  No clear evidence of autoimmune process  Her lung changes are very minimal and does not need any aggressive intervention or work-up such as lung biopsy and we can monitor for now.  Chronic cough Continue Pepcid Start Flonase for postnasal drip  Plan/Recommendations: - Flonase - Follow-up in 1 year  Chilton Greathouse MD [facility] 09/17/2020, 11:19 AM  CC: Iona Hansen, NP

## 2020-09-17 NOTE — Patient Instructions (Signed)
I have reviewed his CT scan which does not show significant lung issues which is good news We will start you on a Flonase nasal spray for cough  Follow-up in 1 year.

## 2020-10-16 ENCOUNTER — Encounter (INDEPENDENT_AMBULATORY_CARE_PROVIDER_SITE_OTHER): Payer: Self-pay

## 2020-10-16 ENCOUNTER — Other Ambulatory Visit: Payer: Self-pay

## 2020-10-16 ENCOUNTER — Ambulatory Visit: Payer: Medicare Other | Admitting: Cardiovascular Disease

## 2020-10-16 ENCOUNTER — Encounter: Payer: Self-pay | Admitting: Cardiovascular Disease

## 2020-10-16 VITALS — BP 136/66 | HR 57 | Ht 64.0 in | Wt 138.4 lb

## 2020-10-16 DIAGNOSIS — I209 Angina pectoris, unspecified: Secondary | ICD-10-CM

## 2020-10-16 DIAGNOSIS — I2510 Atherosclerotic heart disease of native coronary artery without angina pectoris: Secondary | ICD-10-CM | POA: Diagnosis not present

## 2020-10-16 DIAGNOSIS — I2584 Coronary atherosclerosis due to calcified coronary lesion: Secondary | ICD-10-CM | POA: Diagnosis not present

## 2020-10-16 DIAGNOSIS — R0789 Other chest pain: Secondary | ICD-10-CM | POA: Insufficient documentation

## 2020-10-16 MED ORDER — NITROGLYCERIN 0.4 MG SL SUBL
0.4000 mg | SUBLINGUAL_TABLET | SUBLINGUAL | 3 refills | Status: DC | PRN
Start: 2020-10-16 — End: 2024-01-10

## 2020-10-16 NOTE — Patient Instructions (Addendum)
Medication Instructions:  Your physician has recommended you make the following change in your medication:   NITROGLYCERIN as needed  *If you need a refill on your cardiac medications before your next appointment, please call your pharmacy*   Lab Work: none If you have labs (blood work) drawn today and your tests are completely normal, you will receive your results only by: Marland Kitchen MyChart Message (if you have MyChart) OR . A paper copy in the mail If you have any lab test that is abnormal or we need to change your treatment, we will call you to review the results.   Testing/Procedures: none   Follow-Up: At [facility], you and your health needs are our priority.  As part of our continuing mission to provide you with exceptional heart care, we have created designated Provider Care Teams.  These Care Teams include your primary Cardiologist (physician) and Advanced Practice Providers (APPs -  Physician Assistants and Nurse Practitioners) who all work together to provide you with the care you need, when you need it.  We recommend signing up for the patient portal called "MyChart".  Sign up information is provided on this After Visit Summary.  MyChart is used to connect with patients for Virtual Visits (Telemedicine).  Patients are able to view lab/test results, encounter notes, upcoming appointments, etc.  Non-urgent messages can be sent to your provider as well.   To learn more about what you can do with MyChart, go to [email or web address].    Your next appointment:  04/23/2021 at 2:20pm 6 month(s)  The format for your next appointment:   In Person  Provider:   Kristeen Miss, MD

## 2020-10-16 NOTE — Progress Notes (Signed)
Cardiology Office Note:    Date:  10/16/2020   ID:  Tina [name], DOB May 30, 1931, MRN [id number]  PCP:  Tina Hansen, NP  Cardiologist:  Tametra Ahart  Electrophysiologist:  None   Referring MD: Tina Hansen, NP   Chief Complaint  Patient presents with  . Coronary Artery Disease       I   :    Tina [name] is a 87 y.o. female with a hx of CAD.    She was referred to me by Dr. [name] for further management of her CAD  Seen with her daughter, Tina [name].   She is seen frequently by the pulmonary group for her ILD. I saw her about 10 years ago   She fell and broke her arm last year. The CT scan showed interstitial lung disease.  She was incidentally found to have coronary artery calcifications   Does have some intrascapular CP with walking or when she gets tired.   Resolves with rest  No syncope   Has a chronic cough Does not do any regular exercise.   Does her house work .  Has occasional falling episdoses from dizziness and being unsteady.  Has symptoms c/w orthostasis.   February 17, 2020  [name] presents for follow up of her CP, hypertipidemia lexiscan myoview was negative for ishemia and revealed normal LV function 30 day event monitor revealed no significant arrhythmais   No recent episodes of CP  Still has some intrascapular pain - mostly in the late afternoon when she is getting tired.  Does not seem to be related to exertion .   October 16, 2020: Tina [name] continues to have intermittent CP , Will give her some NTG to see if they help If so, will need to consider a cath  She is active around the house, no significant exercise  Still working through [employer] ( passed away  08/20/20 )    Past Medical History:  Diagnosis Date  . Canker sore   . Cystocele   . Depression   . Diverticulosis   . Esophageal dysmotility   . Esophageal stricture   . GERD (gastroesophageal reflux disease)   . Heartburn   . Hypertension   . TIA (transient ischemic attack)      Past Surgical History:  Procedure Laterality Date  . APPENDECTOMY    . BRAVO PH STUDY N/A 01/01/2020   Procedure: BRAVO PH STUDY- 48hrs;  Surgeon: Napoleon Form, MD;  Location: [facility];  Service: Endoscopy;  Laterality: N/A;  . CATARACT EXTRACTION    . COLON SURGERY    . ESOPHAGOGASTRODUODENOSCOPY N/A 01/01/2020   Procedure: ESOPHAGOGASTRODUODENOSCOPY (EGD);  Surgeon: Napoleon Form, MD;  Location: [facility];  Service: Endoscopy;  Laterality: N/A;  . JOINT REPLACEMENT    . PARTIAL COLECTOMY  09/1991   due to diverticulitis  . SHOULDER SURGERY     right shoulder- removal calcium deposit  . TOTAL HIP ARTHROPLASTY  6/03  . TOTAL HIP ARTHROPLASTY Left 02/24/2016   Procedure: LEFT TOTAL HIP ARTHROPLASTY ANTERIOR APPROACH;  Surgeon: [name], MD;  Location: [facility];  Service: Orthopedics;  Laterality: Left;    Current Medications: Current Meds  Medication Sig  . acetaminophen (TYLENOL) 500 MG tablet Take 500 mg by mouth every 6 (six) hours as needed for mild pain, moderate pain, fever or headache.  . albuterol (VENTOLIN HFA) 108 (90 Base) MCG/ACT inhaler Inhale 2 puffs into the lungs every 6 (six) hours as needed for wheezing  or shortness of breath.   Marland Kitchen atorvastatin (LIPITOR) 10 MG tablet Take 10 mg by mouth daily at 6 PM.   . BIOTIN PO Take 1 tablet by mouth daily.  . Calcium Carbonate-Vitamin D 600-400 MG-UNIT tablet Take 1 tablet by mouth daily.  . cholecalciferol (VITAMIN D) 1000 units tablet Take 1,000 Units by mouth every other day.  . ciprofloxacin (CIPRO) 250 MG tablet Take 250 mg by mouth 2 (two) times daily as needed (uti symptoms). For uti  . citalopram (CELEXA) 20 MG tablet Take 20 mg by mouth daily.  . famotidine (PEPCID) 20 MG tablet TAKE 1 TABLET BY MOUTH TWICE A DAY  . fluticasone (FLONASE) 50 MCG/ACT nasal spray Place 1 spray into both nostrils daily.  Marland Kitchen HYDROcodone-homatropine (HYCODAN) 5-1.5 MG/5ML syrup Take 5 mLs by mouth every 6 (six) hours as  needed for cough.  . losartan (COZAAR) 25 MG tablet Take 1 tablet (25 mg total) by mouth daily.  . Multiple Vitamin (MULTIVITAMIN WITH MINERALS) TABS tablet Take 1 tablet by mouth 3 (three) times a week.  . nitroGLYCERIN (NITROSTAT) 0.4 MG SL tablet Place 1 tablet (0.4 mg total) under the tongue every 5 (five) minutes as needed for chest pain.  Bertram Gala Glycol-Propyl Glycol (SYSTANE OP) Place 1 drop into both eyes daily as needed (dry eyes/ allergies).  . polyethylene glycol (MIRALAX / GLYCOLAX) packet Take 17 g by mouth daily as needed for mild constipation.  Marland Kitchen zolpidem (AMBIEN) 5 MG tablet Take 2.5 mg by mouth at bedtime as needed for sleep.      Allergies:   Valdecoxib and Prednisone   Social History   Socioeconomic History  . Marital status: Married    Spouse name: Not on file  . Number of children: Not on file  . Years of education: Not on file  . Highest education level: Not on file  Occupational History  . Not on file  Tobacco Use  . Smoking status: Former Smoker    Packs/day: 0.50    Years: 15.00    Pack years: 7.50    Types: Cigarettes    Quit date: 08/02/1963    Years since quitting: 57.2  . Smokeless tobacco: Never Used  Vaping Use  . Vaping Use: Never used  Substance and Sexual Activity  . Alcohol use: No  . Drug use: No  . Sexual activity: Not on file  Other Topics Concern  . Not on file  Social History Narrative  . Not on file   Social Determinants of Health   Financial Resource Strain: Not on file  Food Insecurity: Not on file  Transportation Needs: Not on file  Physical Activity: Not on file  Stress: Not on file  Social Connections: Not on file     Family History: The patient's family history includes Cancer in her father, paternal aunt, and paternal uncle; Diabetes in her father and sister; Heart disease in her brother, father, and sister; Leukemia in her sister.  ROS:   Please see the history of present illness.     All other systems reviewed  and are negative.  EKGs/Labs/Other Studies Reviewed:    The following studies were reviewed today:    Recent Labs: 02/17/2020: ALT 10; BUN 25; Creatinine, Ser 0.86; Potassium 4.5; Sodium 141  Recent Lipid Panel    Component Value Date/Time   CHOL 157 02/17/2020 1027   TRIG 75 02/17/2020 1027   HDL 59 02/17/2020 1027   CHOLHDL 2.7 02/17/2020 1027   LDLCALC 84 02/17/2020  1027    Physical Exam:    Physical Exam: Blood pressure 136/66, pulse (!) 57, height 5\' 4"  (1.626 m), weight 138 lb 6.4 oz (62.8 kg), SpO2 99 %.  GEN:  Well nourished, well developed in no acute distress HEENT: Normal NECK: No JVD; No carotid bruits LYMPHATICS: No lymphadenopathy CARDIAC: RRR , no murmurs, rubs, gallops RESPIRATORY:  Clear to auscultation without rales, wheezing or rhonchi  ABDOMEN: Soft, non-tender, non-distended MUSCULOSKELETAL:  No edema; No deformity  SKIN: Warm and dry NEUROLOGIC:  Alert and oriented x 3   ECG :   October 16, 2020: Sinus bradycardia 57.  No ST or T wave changes.  ASSESSMENT:    1. Cardiac angina (HCC)    PLAN:       1.  Intrascapular pain: She presents with intrascapular pain that typically occurs later in the day when she is fatigued.  Stress Myoview study did not reveal any ischemia.  She has normal left ventricular systolic function.    She continues to have intermittent episodes of chest pain and tightness.  We will send her in a prescription for nitroglycerin.  If the nitroglycerin seems to relieve her pain then we may need to consider heart catheterization.  Myoview study did not reveal any evidence of ischemia but this might represent balanced ischemia.  I will see her in 6 months for follow-up visit.     2.   Hypertension: .  Her blood pressure seems to be fairly well controlled.  3.  . Hyperlipidemia: Continue current medications.   Medication Adjustments/Labs and Tests Ordered: Current medicines are reviewed at length with the patient today.   Concerns regarding medicines are outlined above.  Orders Placed This Encounter  Procedures  . EKG 12-Lead   Meds ordered this encounter  Medications  . nitroGLYCERIN (NITROSTAT) 0.4 MG SL tablet    Sig: Place 1 tablet (0.4 mg total) under the tongue every 5 (five) minutes as needed for chest pain.    Dispense:  25 tablet    Refill:  3     Patient Instructions  Medication Instructions:  Your physician has recommended you make the following change in your medication:   NITROGLYCERIN as needed  *If you need a refill on your cardiac medications before your next appointment, please call your pharmacy*   Lab Work: none If you have labs (blood work) drawn today and your tests are completely normal, you will receive your results only by: October 18, 2020 MyChart Message (if you have MyChart) OR . A paper copy in the mail If you have any lab test that is abnormal or we need to change your treatment, we will call you to review the results.   Testing/Procedures: none   Follow-Up: At [facility], you and your health needs are our priority.  As part of our continuing mission to provide you with exceptional heart care, we have created designated Provider Care Teams.  These Care Teams include your primary Cardiologist (physician) and Advanced Practice Providers (APPs -  Physician Assistants and Nurse Practitioners) who all work together to provide you with the care you need, when you need it.  We recommend signing up for the patient portal called "MyChart".  Sign up information is provided on this After Visit Summary.  MyChart is used to connect with patients for Virtual Visits (Telemedicine).  Patients are able to view lab/test results, encounter notes, upcoming appointments, etc.  Non-urgent messages can be sent to your provider as well.   To learn more  about what you can do with MyChart, go to [email or web address].    Your next appointment:  04/23/2021 at 2:20pm 6 month(s)  The format for your  next appointment:   In Person  Provider:   Kristeen MissPhilip Caleb Decock, MD      Signed, Kristeen MissPhilip Larance Ratledge, MD  10/16/2020 3:03 PM    [facility]

## 2020-12-01 ENCOUNTER — Other Ambulatory Visit: Payer: Self-pay | Admitting: Cardiovascular Disease

## 2021-04-23 ENCOUNTER — Ambulatory Visit: Payer: Medicare Other | Admitting: Cardiovascular Disease

## 2021-04-23 ENCOUNTER — Encounter: Payer: Self-pay | Admitting: Cardiovascular Disease

## 2021-04-23 ENCOUNTER — Other Ambulatory Visit: Payer: Self-pay

## 2021-04-23 VITALS — BP 132/56 | HR 73 | Ht 64.0 in | Wt 138.8 lb

## 2021-04-23 DIAGNOSIS — I2510 Atherosclerotic heart disease of native coronary artery without angina pectoris: Secondary | ICD-10-CM | POA: Diagnosis not present

## 2021-04-23 DIAGNOSIS — E785 Hyperlipidemia, unspecified: Secondary | ICD-10-CM | POA: Diagnosis not present

## 2021-04-23 DIAGNOSIS — I10 Essential (primary) hypertension: Secondary | ICD-10-CM | POA: Diagnosis not present

## 2021-04-23 DIAGNOSIS — R42 Dizziness and giddiness: Secondary | ICD-10-CM | POA: Diagnosis not present

## 2021-04-23 DIAGNOSIS — I2584 Coronary atherosclerosis due to calcified coronary lesion: Secondary | ICD-10-CM

## 2021-04-23 NOTE — Progress Notes (Signed)
Cardiology Office Note:    Date:  04/23/2021   ID:  Tina [name], DOB 1931-03-24, MRN [id number]  PCP:  Iona Hansen, NP  Cardiologist:  Mychaela Lennartz  Electrophysiologist:  None   Referring MD: Iona Hansen, NP   Chief Complaint  Patient presents with   Coronary Artery Disease        Shortness of Breath  I   :    Tina [name] is a 87 y.o. female with a hx of CAD.    She was referred to me by Dr. [name] for further management of her CAD  Seen with her daughter, [name].   She is seen frequently by the pulmonary group for her ILD. I saw her about 10 years ago   She fell and broke her arm last year. The CT scan showed interstitial lung disease.  She was incidentally found to have coronary artery calcifications   Does have some intrascapular CP with walking or when she gets tired.   Resolves with rest  No syncope   Has a chronic cough Does not do any regular exercise.   Does her house work .  Has occasional falling episdoses from dizziness and being unsteady.  Has symptoms c/w orthostasis.   February 17, 2020  Tina [name] presents for follow up of her CP, hypertipidemia lexiscan myoview was negative for ishemia and revealed normal LV function 30 day event monitor revealed no significant arrhythmais   No recent episodes of CP  Still has some intrascapular pain - mostly in the late afternoon when she is getting tired.  Does not seem to be related to exertion .   October 16, 2020: Tina [name] continues to have intermittent CP , Will give her some NTG to see if they help If so, will need to consider a cath  She is active around the house, no significant exercise  Still working through [employer] ( passed away  09/13/2020 )    Sept 23, 2022:  Tina [name] is seen today for follow up visit  She was having symptoms of chest discomfort when I last saw her.  We gave her a prescription for nitroglycerin.  We discussed doing a heart catheterization but this has not been performed  yet.  She is having some indigestion after eating .   At night  Resolves fairly quickly . Has a new complaint of "bones feeling heavy or aching " Has been on atorvastatin for a long time We discussed having her stop the atorvastatin for a month or so   Past Medical History:  Diagnosis Date   Canker sore    Cystocele    Depression    Diverticulosis    Esophageal dysmotility    Esophageal stricture    GERD (gastroesophageal reflux disease)    Heartburn    Hypertension    TIA (transient ischemic attack)     Past Surgical History:  Procedure Laterality Date   APPENDECTOMY     BRAVO PH STUDY N/A 01/01/2020   Procedure: BRAVO PH STUDY- 48hrs;  Surgeon: Napoleon Form, MD;  Location: [facility];  Service: Endoscopy;  Laterality: N/A;   CATARACT EXTRACTION     COLON SURGERY     ESOPHAGOGASTRODUODENOSCOPY N/A 01/01/2020   Procedure: ESOPHAGOGASTRODUODENOSCOPY (EGD);  Surgeon: Napoleon Form, MD;  Location: [facility];  Service: Endoscopy;  Laterality: N/A;   JOINT REPLACEMENT     PARTIAL COLECTOMY  09/1991   due to diverticulitis   SHOULDER SURGERY  right shoulder- removal calcium deposit   TOTAL HIP ARTHROPLASTY  6/03   TOTAL HIP ARTHROPLASTY Left 02/24/2016   Procedure: LEFT TOTAL HIP ARTHROPLASTY ANTERIOR APPROACH;  Surgeon: [name], MD;  Location: [facility];  Service: Orthopedics;  Laterality: Left;    Current Medications: Current Meds  Medication Sig   acetaminophen (TYLENOL) 500 MG tablet Take 500 mg by mouth every 6 (six) hours as needed for mild pain, moderate pain, fever or headache.   albuterol (VENTOLIN HFA) 108 (90 Base) MCG/ACT inhaler Inhale 2 puffs into the lungs every 6 (six) hours as needed for wheezing or shortness of breath.    atorvastatin (LIPITOR) 10 MG tablet Take 10 mg by mouth daily at 6 PM. ON HOLD 04/23/21   BIOTIN PO Take 1 tablet by mouth daily.   Calcium Carbonate-Vitamin D 600-400 MG-UNIT tablet Take 1 tablet by mouth daily.    cholecalciferol (VITAMIN D) 1000 units tablet Take 1,000 Units by mouth every other day.   ciprofloxacin (CIPRO) 250 MG tablet Take 250 mg by mouth 2 (two) times daily as needed (uti symptoms). For uti   citalopram (CELEXA) 20 MG tablet Take 20 mg by mouth daily.   fluticasone (FLONASE) 50 MCG/ACT nasal spray Place 1 spray into both nostrils daily.   HYDROcodone-homatropine (HYCODAN) 5-1.5 MG/5ML syrup Take 5 mLs by mouth every 6 (six) hours as needed for cough.   losartan (COZAAR) 25 MG tablet TAKE 1 TABLET BY MOUTH  DAILY   Multiple Vitamin (MULTIVITAMIN WITH MINERALS) TABS tablet Take 1 tablet by mouth 3 (three) times a week.   nitroGLYCERIN (NITROSTAT) 0.4 MG SL tablet Place 1 tablet (0.4 mg total) under the tongue every 5 (five) minutes as needed for chest pain.   Polyethyl Glycol-Propyl Glycol (SYSTANE OP) Place 1 drop into both eyes daily as needed (dry eyes/ allergies).   polyethylene glycol (MIRALAX / GLYCOLAX) packet Take 17 g by mouth daily as needed for mild constipation.   zolpidem (AMBIEN) 5 MG tablet Take 2.5 mg by mouth at bedtime as needed for sleep.      Allergies:   Valdecoxib and Prednisone   Social History   Socioeconomic History   Marital status: Married    Spouse name: Not on file   Number of children: Not on file   Years of education: Not on file   Highest education level: Not on file  Occupational History   Not on file  Tobacco Use   Smoking status: Former    Packs/day: 0.50    Years: 15.00    Pack years: 7.50    Types: Cigarettes    Quit date: 08/02/1963    Years since quitting: 57.7   Smokeless tobacco: Never  Vaping Use   Vaping Use: Never used  Substance and Sexual Activity   Alcohol use: No   Drug use: No   Sexual activity: Not on file  Other Topics Concern   Not on file  Social History Narrative   Not on file   Social Determinants of Health   Financial Resource Strain: Not on file  Food Insecurity: Not on file  Transportation Needs: Not on  file  Physical Activity: Not on file  Stress: Not on file  Social Connections: Not on file     Family History: The patient's family history includes Cancer in her father, paternal aunt, and paternal uncle; Diabetes in her father and sister; Heart disease in her brother, father, and sister; Leukemia in her sister.  ROS:   Please  see the history of present illness.     All other systems reviewed and are negative.  EKGs/Labs/Other Studies Reviewed:    The following studies were reviewed today:  Recent Labs: No results found for requested labs within last 8760 hours.  Recent Lipid Panel    Component Value Date/Time   CHOL 157 02/17/2020 1027   TRIG 75 02/17/2020 1027   HDL 59 02/17/2020 1027   CHOLHDL 2.7 02/17/2020 1027   LDLCALC 84 02/17/2020 1027    Physical Exam:     Physical Exam: Blood pressure (!) 132/56, pulse 73, height 5\' 4"  (1.626 m), weight 138 lb 12.8 oz (63 kg), SpO2 99 %.  GEN: Elderly female, no acute distress HEENT: Normal NECK: No JVD; No carotid bruits LYMPHATICS: No lymphadenopathy CARDIAC: RRR , no murmurs, rubs, gallops RESPIRATORY:  Clear to auscultation without rales, wheezing or rhonchi  ABDOMEN: Soft, non-tender, non-distended MUSCULOSKELETAL:  No edema; No deformity  SKIN: Warm and dry NEUROLOGIC:  Alert and oriented x 3    ECG :     ASSESSMENT:    1. Coronary artery calcification   2. Essential hypertension   3. Hyperlipidemia, unspecified hyperlipidemia type   4. Dizziness     PLAN:    1.  Generalized aches and pains: She complains of having lots of bone pain.  There is a possibility that this is due to her atorvastatin although she has been on it for many years.  We will hold the atorvastatin and see if this gets rid of the pain.  If the pain is no different by November 1 she is to restart the atorvastatin.  I have advised her to discuss this generalized aches and pains with her primary medical doctor.   2.   Hypertension: .    Blood pressures fairly well controlled.  3.  . Hyperlipidemia: Continue current medications.   Medication Adjustments/Labs and Tests Ordered: Current medicines are reviewed at length with the patient today.  Concerns regarding medicines are outlined above.  Orders Placed This Encounter  Procedures   Basic Metabolic Panel (BMET)    No orders of the defined types were placed in this encounter.    Patient Instructions  Medication Instructions:  Your physician has recommended you make the following change in your medication: HOLD Atorvastatin for 1 month to see if this helps with your bone pain Call back to let 09-21-2001 know  *If you need a refill on your cardiac medications before your next appointment, please call your pharmacy*   Lab Work: TODAY - BMET If you have labs (blood work) drawn today and your tests are completely normal, you will receive your results only by: MyChart Message (if you have MyChart) OR A paper copy in the mail If you have any lab test that is abnormal or we need to change your treatment, we will call you to review the results.   Testing/Procedures: None Ordered   Follow-Up: At [facility], you and your health needs are our priority.  As part of our continuing mission to provide you with exceptional heart care, we have created designated Provider Care Teams.  These Care Teams include your primary Cardiologist (physician) and Advanced Practice Providers (APPs -  Physician Assistants and Nurse Practitioners) who all work together to provide you with the care you need, when you need it.  We recommend signing up for the patient portal called "MyChart".  Sign up information is provided on this After Visit Summary.  MyChart is used to connect  with patients for Virtual Visits (Telemedicine).  Patients are able to view lab/test results, encounter notes, upcoming appointments, etc.  Non-urgent messages can be sent to your provider as well.   To learn more about what  you can do with MyChart, go to [email or web address].    Your next appointment:   6 month(s)  The format for your next appointment:   In Person  Provider:   You will see one of the following Advanced Practice Providers on your designated Care Team:   Tereso Newcomer, PA-C [place] Aus, [place]      Signed, Kristeen Miss, MD  04/23/2021 5:34 PM    [facility]

## 2021-04-23 NOTE — Patient Instructions (Signed)
Medication Instructions:  Your physician has recommended you make the following change in your medication: HOLD Atorvastatin for 1 month to see if this helps with your bone pain Call back to let us know  *If you need a refill on your cardiac medications before your next appointment, please call your pharmacy*   Lab Work: TODAY - BMET If you have labs (blood work) drawn today and your tests are completely normal, you will receive your results only by: MyChart Message (if you have MyChart) OR A paper copy in the mail If you have any lab test that is abnormal or we need to change your treatment, we will call you to review the results.   Testing/Procedures: None Ordered   Follow-Up: At [facility], you and your health needs are our priority.  As part of our continuing mission to provide you with exceptional heart care, we have created designated Provider Care Teams.  These Care Teams include your primary Cardiologist (physician) and Advanced Practice Providers (APPs -  Physician Assistants and Nurse Practitioners) who all work together to provide you with the care you need, when you need it.  We recommend signing up for the patient portal called "MyChart".  Sign up information is provided on this After Visit Summary.  MyChart is used to connect with patients for Virtual Visits (Telemedicine).  Patients are able to view lab/test results, encounter notes, upcoming appointments, etc.  Non-urgent messages can be sent to your provider as well.   To learn more about what you can do with MyChart, go to [email or web address].    Your next appointment:   6 month(s)  The format for your next appointment:   In Person  Provider:   You will see one of the following Advanced Practice Providers on your designated Care Team:   Tereso Newcomer, PA-C [place], [place]

## 2021-04-24 LAB — BASIC METABOLIC PANEL
BUN/Creatinine Ratio: 29 — ABNORMAL HIGH (ref 12–28)
BUN: 29 mg/dL — ABNORMAL HIGH (ref 8–27)
CO2: 25 mmol/L (ref 20–29)
Calcium: 9.4 mg/dL (ref 8.7–10.3)
Chloride: 103 mmol/L (ref 96–106)
Creatinine, Ser: 1 mg/dL (ref 0.57–1.00)
Glucose: 98 mg/dL (ref 65–99)
Potassium: 4.4 mmol/L (ref 3.5–5.2)
Sodium: 143 mmol/L (ref 134–144)
eGFR: 55 mL/min/{1.73_m2} — ABNORMAL LOW (ref >59)

## 2021-05-23 ENCOUNTER — Other Ambulatory Visit: Payer: Self-pay | Admitting: Cardiovascular Disease

## 2021-10-07 ENCOUNTER — Telehealth: Payer: Self-pay | Admitting: Pulmonary Disease

## 2021-10-08 NOTE — Telephone Encounter (Signed)
Called and spoke with patient's daughter [name]. She stated that the she and the patient would like for her to switch her care to Dr. Delton Coombes. I explained to her the office policy about switching providers and she verbalized understanding.  ? ?Dr. Isaiah Serge, are you ok with her switching to Dr. Delton Coombes?  ? ?Dr. Delton Coombes, are you willing to take her as a new patient?   ?

## 2021-10-11 NOTE — Telephone Encounter (Signed)
I'm ok if Dr Isaiah Serge is ?

## 2021-10-12 NOTE — Telephone Encounter (Signed)
Appt scheduled 12/21/2021 with Dr Delton Coombes. ?Patient is aware and voiced her understanding.  ?Nothing further needed.  ? ?

## 2021-10-12 NOTE — Telephone Encounter (Signed)
I am ok with the switch 

## 2021-10-28 ENCOUNTER — Other Ambulatory Visit: Payer: Self-pay | Admitting: Family Medicine

## 2021-10-28 ENCOUNTER — Ambulatory Visit
Admission: RE | Admit: 2021-10-28 | Discharge: 2021-10-28 | Disposition: A | Discharge status: Home / Self Care | Payer: Medicare Other | Source: Ambulatory Visit | Attending: Family Medicine | Admitting: Family Medicine

## 2021-10-28 DIAGNOSIS — J3489 Other specified disorders of nose and nasal sinuses: Secondary | ICD-10-CM

## 2021-12-07 ENCOUNTER — Ambulatory Visit: Payer: Medicare Other | Admitting: Nurse Practitioner

## 2021-12-07 ENCOUNTER — Encounter: Payer: Self-pay | Admitting: Nurse Practitioner

## 2021-12-07 VITALS — BP 100/50 | HR 72 | Ht 61.0 in | Wt 143.0 lb

## 2021-12-07 DIAGNOSIS — K5902 Outlet dysfunction constipation: Secondary | ICD-10-CM | POA: Diagnosis not present

## 2021-12-07 DIAGNOSIS — R151 Fecal smearing: Secondary | ICD-10-CM

## 2021-12-07 DIAGNOSIS — R1030 Lower abdominal pain, unspecified: Secondary | ICD-10-CM | POA: Diagnosis not present

## 2021-12-07 NOTE — Progress Notes (Signed)
? ? ?Assessment  ? ?Patient Profile:  ?Tina [name] is a 88 y.o. female known to Dr. Lavon PaganiniNandigam with a past medical history of GERD, esophageal stricture, diverticulosis, partial colectomy, CAD, HLD, HTN.  Additional medical history as listed in PMH . ? ?Chronic fecal leakage.  ?Possibly some overflow related to constipation and / or incomplete rectal imaging.   Rectal exam revealing only of a sluggish pelvic descent. She also has urinary leakage. Pelvic floor weakness / dysfunction probably contributing factor.  ? ?Generalized lower abdominal "soreness".  ? ? ?Plan  ? ?Let's take constipation out of the equation. She will a daily fiber supplement. Sounds like she prefers capsules which is okay.  ?Additionally can take Miralax 2-3 times a week if needed.  ?After BM insert glycerin suppository as directed to help ensure complete emptying of the rectum ?She will follow-up with me in a few weeks. If still having problems with lower abdominal discomfort will likely obtain a CT AP. This can not only help identify an inflammatory process but also a colon lesion or even a rectocele in some cases.   ?If fecal incontinent persists consider referral to pelvic floor PT.  ? ?HPI  ? ?Chief Complaint : bowel issues ? ?Patient was last seen in April 2021, at that time for evaluation of cough and dysphagia. She underwent EGD with Bravo placement to rule out reflux as EGD was essentially normal. Bravo showed no significant reflux.  ? ?Interval history;  ?She is "sore" in her in whole lower abdomen and hips and cannot get around well. Rectum is sore inside. She doesn't get the sensation that she needs to have a BM anymore. She has sensation of incomplete rectal emptying. She takes Miralax a couple of time a week if she goes a few days without a BM. Then she sits on toilet to try and move bowels.  She is not incontinent  but does has fecal leakage and has to wear pads. She thinks this contributes to her recurrent UTIs. She wants to  make sure she doesn't have colorectal cancer.  No blood in stools.  The constipation and fecal leakage are chronic problems but the "sore" lower abdomen is relatively new. She has chronic urinary leakage as well. She is followed by [facility] Lindley [facility]w [facility].  ? ? ?Previous GI Evaluation  ? ?Sept 2012 colonoscopy  ?- Severe diverticulosis ? ?June 2021 EGD for cough and dysphagia ?--small hiatal hernia ?--Bravo deployed. Results showed no significant reflux ? ?Past Medical History:  ?Diagnosis Date  ? Canker sore   ? Cystocele   ? Depression   ? Diverticulosis   ? Esophageal dysmotility   ? Esophageal stricture   ? GERD (gastroesophageal reflux disease)   ? Heartburn   ? Hypertension   ? TIA (transient ischemic attack)   ? ? ?Past Surgical History:  ?Procedure Laterality Date  ? APPENDECTOMY    ? BRAVO PH STUDY N/A 01/01/2020  ? Procedure: BRAVO PH STUDY- 48hrs;  Surgeon: Napoleon FormNandigam, Kavitha V, MD;  Location: [facility];  Service: Endoscopy;  Laterality: N/A;  ? CATARACT EXTRACTION    ? COLON SURGERY    ? ESOPHAGOGASTRODUODENOSCOPY N/A 01/01/2020  ? Procedure: ESOPHAGOGASTRODUODENOSCOPY (EGD);  Surgeon: Napoleon FormNandigam, Kavitha V, MD;  [facility];  Service: Endoscopy;  Laterality: N/A;  ? JOINT REPLACEMENT    ? PARTIAL COLECTOMY  09/1991  ? due to diverticulitis  ? SHOULDER SURGERY    ? right shoulder- removal calcium deposit  ? TOTAL HIP  ARTHROPLASTY  6/03  ? TOTAL HIP ARTHROPLASTY Left 02/24/2016  ? Procedure: LEFT TOTAL HIP ARTHROPLASTY ANTERIOR APPROACH;  Surgeon: [name], MD;  Location: [facility];  Service: Orthopedics;  Laterality: Left;  ? ? ?Current Medications, Allergies, Family History and Social History were reviewed in [employer] record. ?  ?  ?Current Outpatient Medications  ?Medication Sig Dispense Refill  ? acetaminophen (TYLENOL) 500 MG tablet Take 500 mg by mouth every 6 (six) hours as needed for mild pain, moderate pain, fever or headache.    ? albuterol (VENTOLIN HFA)  108 (90 Base) MCG/ACT inhaler Inhale 2 puffs into the lungs every 6 (six) hours as needed for wheezing or shortness of breath.     ? atorvastatin (LIPITOR) 10 MG tablet Take 10 mg by mouth daily at 6 PM. ON HOLD 04/23/21    ? BIOTIN PO Take 1 tablet by mouth daily.    ? Calcium Carbonate-Vitamin D 600-400 MG-UNIT tablet Take 1 tablet by mouth daily.    ? cholecalciferol (VITAMIN D) 1000 units tablet Take 1,000 Units by mouth every other day.    ? ciprofloxacin (CIPRO) 250 MG tablet Take 250 mg by mouth 2 (two) times daily as needed (uti symptoms). For uti    ? citalopram (CELEXA) 20 MG tablet Take 20 mg by mouth daily.    ? famotidine (PEPCID) 20 MG tablet TAKE 1 TABLET BY MOUTH TWICE A DAY (Patient not taking: Reported on 04/23/2021) 180 tablet 1  ? fluticasone (FLONASE) 50 MCG/ACT nasal spray Place 1 spray into both nostrils daily. 16 g 2  ? HYDROcodone-homatropine (HYCODAN) 5-1.5 MG/5ML syrup Take 5 mLs by mouth every 6 (six) hours as needed for cough. 473 mL 0  ? losartan (COZAAR) 25 MG tablet TAKE 1 TABLET BY MOUTH  DAILY 90 tablet 3  ? Multiple Vitamin (MULTIVITAMIN WITH MINERALS) TABS tablet Take 1 tablet by mouth 3 (three) times a week.    ? nitroGLYCERIN (NITROSTAT) 0.4 MG SL tablet Place 1 tablet (0.4 mg total) under the tongue every 5 (five) minutes as needed for chest pain. 25 tablet 3  ? Polyethyl Glycol-Propyl Glycol (SYSTANE OP) Place 1 drop into both eyes daily as needed (dry eyes/ allergies).    ? polyethylene glycol (MIRALAX / GLYCOLAX) packet Take 17 g by mouth daily as needed for mild constipation.    ? zolpidem (AMBIEN) 5 MG tablet Take 2.5 mg by mouth at bedtime as needed for sleep.     ? ?No current facility-administered medications for this visit.  ? ? ?Review of Systems: ?No chest pain. No shortness of breath. No urinary complaints.  ? ? ?Physical Exam ? ?Wt Readings from Last 3 Encounters:  ?04/23/21 138 lb 12.8 oz (63 kg)  ?10/16/20 138 lb 6.4 oz (62.8 kg)  ?09/17/20 138 lb 12.8 oz (63 kg)   ? ? ?BP (!) 100/50   Pulse 72   Ht 5\' 1"  (1.549 m)   Wt 143 lb (64.9 kg)   BMI 27.02 kg/m?  ?Constitutional:  Generally well appearing female in no acute distress. ?Psychiatric: Pleasant. Normal mood and affect. Behavior is normal. ?EENT: Pupils normal.  Conjunctivae are normal. No scleral icterus. ?Neck supple.  ?Cardiovascular: Normal rate, regular rhythm. No edema ?Pulmonary/chest: Effort normal and breath sounds normal. No wheezing, rales or rhonchi. ?Abdominal: Soft, nondistended, nontender. Bowel sounds active throughout. There are no masses palpable. No hepatomegaly. ?Rectal Adequate resting sphincter tone and squeeze pressure. Sluggish pelvis descent but did descend with straining.  ?  Neurological: Alert and oriented to person place and time. ?Skin: Skin is warm and dry. No rashes noted. ? ?Willette Cluster, NP  12/07/2021, 1:29 PM ? ? ? ? ? ? ? ? ?

## 2021-12-07 NOTE — Patient Instructions (Addendum)
If you are age 86 or older, your body mass index should be between 23-30. Your Body mass index is 27.02 kg/m?Marland Kitchen If this is out of the aforementioned range listed, please consider follow up with your Primary Care Provider. ?________________________________________________________ ? ?The [facility] providers would like to encourage you to use [facility] to communicate with providers for non-urgent requests or questions.  Due to long hold times on the telephone, sending your provider a message by [facility] may be a faster and more efficient way to get a response.  Please allow 48 business hours for a response.  Please remember that this is for non-urgent requests.  ?_______________________________________________________ ? ?Start using a fiber supplement (Benefiber, Metamucil, or Citrucel). You can also try Fiber-Con pills.  ? ?Use Miralax 1 capful in 8 ounces of water or juice three times weekly if you need it.  ? ?Try Glycerin suppositories after a bowel movement. This can help further empty your rectum.  ? ?Call the office in the next couple of day if you are not seeing any changes. ? ?You have been scheduled to follow up with Willette Cluster, NP-C on January 18, 2022 at 2:00 pm ? ?Thank you for entrusting me with your care and choosing [facility]. ? ?Willette Cluster, NP-C ?

## 2021-12-08 ENCOUNTER — Encounter: Payer: Self-pay | Admitting: Nurse Practitioner

## 2021-12-21 ENCOUNTER — Encounter: Payer: Self-pay | Admitting: Emergency Medicine

## 2021-12-21 ENCOUNTER — Ambulatory Visit (INDEPENDENT_AMBULATORY_CARE_PROVIDER_SITE_OTHER): Payer: Medicare Other | Admitting: Emergency Medicine

## 2021-12-21 VITALS — BP 120/68 | HR 78 | Temp 97.9°F | Ht 61.0 in | Wt 142.2 lb

## 2021-12-21 DIAGNOSIS — J849 Interstitial pulmonary disease, unspecified: Secondary | ICD-10-CM | POA: Diagnosis not present

## 2021-12-21 DIAGNOSIS — R053 Chronic cough: Secondary | ICD-10-CM | POA: Diagnosis not present

## 2021-12-21 NOTE — Patient Instructions (Signed)
We will plan to repeat your high-resolution CT scan of the chest without contrast to follow subtle interstitial changes in the lungs. We could consider adding back treatment for your chronic rhinitis or your chronic reflux depending on how active your cough becomes. Follow Dr. Delton Coombes next available after your CT scan so we can review the results together.

## 2021-12-21 NOTE — Progress Notes (Unsigned)
Subjective:    Patient ID: Tina [name], female    DOB: [birth year], 88 y.o.   MRN: [id number]  HPI 88 year old patient with history of minimal tobacco use (7.5 pack years), hypertension, hyperlipidemia, CAD, GERD with esophageal strictures and dysmotility, TIAs, chronic cough.  She has been followed in our office by Dr. Vaughan Browner for some mild peripheral interstitial lung disease noted on CT chest that was done 08/2019 after a fall.  No clear etiology identified.  Her exposure profile was reassuring.  She did have a positive ANA 1: 40 of unclear significance. She describes chronic cough, has improved some over time, still happens sometimes, several times a week. She has intermittent GERD, is no longer on meds for this. Not on scheduled rhinitis meds either. She has used hydrocodone cough syrup rarely in the past.   Pulmonary function testing 11/07/2019 reviewed by me show grossly normal airflows without a significant bronchodilator response, normal TLC, decreased diffusion capacity.  High-resolution CT scan of the chest 09/03/2020 reviewed, showed minimal irregular peripheral interstitial opacity, somewhat improved compared with her prior scan 08/2019.  No overt ILD changes   Review of Systems As per HPi  Past Medical History:  Diagnosis Date   Canker sore    Cystocele    Depression    Diverticulosis    Esophageal dysmotility    Esophageal stricture    GERD (gastroesophageal reflux disease)    Heartburn    Hypertension    TIA (transient ischemic attack)      Family History  Problem Relation Age of Onset   Cancer Paternal Aunt    Cancer Paternal Uncle    Diabetes Father    Heart disease Father    Cancer Father    Diabetes Sister    Heart disease Sister    Leukemia Sister    Heart disease Brother     Family Hx lung CA  Social History   Socioeconomic History   Marital status: Married    Spouse name: Not on file   Number of children: Not on file   Years of education: Not on  file   Highest education level: Not on file  Occupational History   Not on file  Tobacco Use   Smoking status: Former    Packs/day: 0.50    Years: 15.00    Pack years: 7.50    Types: Cigarettes    Quit date: 08/02/1963    Years since quitting: 58.4   Smokeless tobacco: Never  Vaping Use   Vaping Use: Never used  Substance and Sexual Activity   Alcohol use: No   Drug use: No   Sexual activity: Not on file  Other Topics Concern   Not on file  Social History Narrative   Not on file   Social Determinants of Health   Financial Resource Strain: Not on file  Food Insecurity: Not on file  Transportation Needs: Not on file  Physical Activity: Not on file  Stress: Not on file  Social Connections: Not on file  Intimate Partner Violence: Not on file     Allergies  Allergen Reactions   Valdecoxib Other (See Comments)    Reaction:  Unknown    Prednisone Rash     Outpatient Medications Prior to Visit  Medication Sig Dispense Refill   acetaminophen (TYLENOL) 500 MG tablet Take 500 mg by mouth every 6 (six) hours as needed for mild pain, moderate pain, fever or headache.     albuterol (VENTOLIN HFA) 108 (90 Base)  MCG/ACT inhaler Inhale 2 puffs into the lungs every 6 (six) hours as needed for wheezing or shortness of breath.     atorvastatin (LIPITOR) 10 MG tablet Take 10 mg by mouth daily at 6 PM. ON HOLD 04/23/21     BIOTIN PO Take 1 tablet by mouth daily.     Calcium Carbonate-Vitamin D 600-400 MG-UNIT tablet Take 1 tablet by mouth daily.     cholecalciferol (VITAMIN D) 1000 units tablet Take 1,000 Units by mouth every other day.     estradiol (ESTRACE) 0.1 MG/GM vaginal cream SMARTSIG:1 Vaginal 3 Times a Week     losartan (COZAAR) 25 MG tablet TAKE 1 TABLET BY MOUTH  DAILY 90 tablet 3   mirtazapine (REMERON) 7.5 MG tablet Take 7.5 mg by mouth at bedtime.     nitroGLYCERIN (NITROSTAT) 0.4 MG SL tablet Place 1 tablet (0.4 mg total) under the tongue every 5 (five) minutes as needed  for chest pain. 25 tablet 3   Polyethyl Glycol-Propyl Glycol (SYSTANE OP) Place 1 drop into both eyes daily as needed (dry eyes/ allergies).     polyethylene glycol (MIRALAX / GLYCOLAX) packet Take 17 g by mouth daily as needed for mild constipation.     No facility-administered medications prior to visit.          Objective:   Physical Exam Vitals:   12/21/21 0948  BP: 120/68  Pulse: 78  Temp: 97.9 F (36.6 C)  TempSrc: Oral  SpO2: 97%  Weight: 142 lb 3.2 oz (64.5 kg)  Height: 5\' 1"  (1.549 m)   Gen: Pleasant, well-nourished, in no distress,  normal affect  ENT: No lesions,  mouth clear,  oropharynx clear, no postnasal drip  Neck: No JVD, no stridor  Lungs: No use of accessory muscles, no crackles or wheezing on normal respiration, no wheeze on forced expiration  Cardiovascular: RRR, heart sounds normal, no murmur or gallops, no peripheral edema  Musculoskeletal: No deformities, no cyanosis or clubbing  Neuro: alert, awake, non focal  Skin: Warm, no lesions or rash      Assessment & Plan:  No problem-specific Assessment & Plan notes found for this encounter.   Baltazar Apo, MD, PhD 12/21/2021, 10:11 AM [facility] [phone] or if no answer before 7:00PM call [phone] For any issues after 7:00PM please call eLink [phone]

## 2021-12-22 DIAGNOSIS — R053 Chronic cough: Secondary | ICD-10-CM | POA: Insufficient documentation

## 2021-12-22 DIAGNOSIS — J849 Interstitial pulmonary disease, unspecified: Secondary | ICD-10-CM | POA: Insufficient documentation

## 2021-12-22 NOTE — Assessment & Plan Note (Signed)
Very subtle and stable finding on serial CT scans.  Question whether her dysphagia, intermittent aspiration or other cause.  Her other work-up has been negative.  We will plan to repeat your high-resolution CT scan of the chest without contrast to follow subtle interstitial changes in the lungs. Follow Dr. Delton Coombes next available after your CT scan so we can review the results together.

## 2021-12-22 NOTE — Assessment & Plan Note (Signed)
We could consider adding back treatment for your chronic rhinitis or your chronic reflux depending on how active your cough becomes.

## 2021-12-23 NOTE — Progress Notes (Signed)
Reviewed and agree with documentation and assessment and plan. K. Veena Berry Godsey , MD   

## 2022-01-03 IMAGING — CT CT CHEST W/O CM
2 of 3 series · 15 of 36 positions shown, 18 images · non-contrast
Comparison: 11/03/2006 chest CT.  01/29/2019 cervical spine CT.

CLINICAL DATA: Chronic cough. Dyspnea. Follow-up indistinct apical
left lung opacity incidentally noted on cervical spine CT.

EXAM:
CT CHEST WITHOUT CONTRAST
TECHNIQUE: Multidetector CT imaging of the chest was performed following the
standard protocol without IV contrast.

[Series 2: thorax · axial · 0.67mm/px · z∈[-306,-36]mm · 12 of 159 slices shown, 15 images]
[im 12/159  mediastinal]
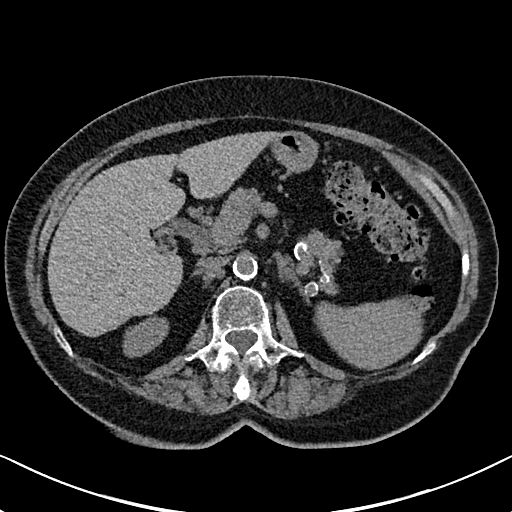
[im 12/159  lung]
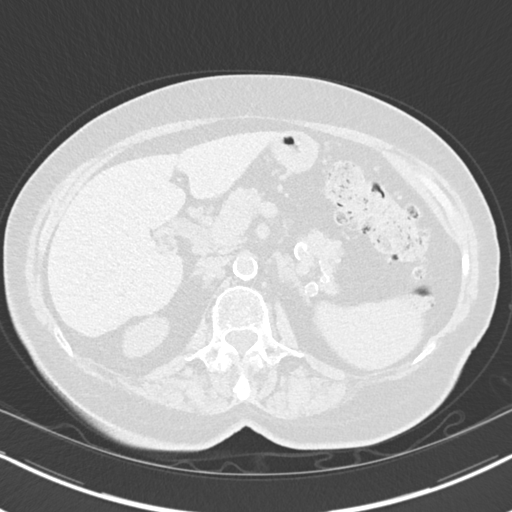
[im 24/159  lung]
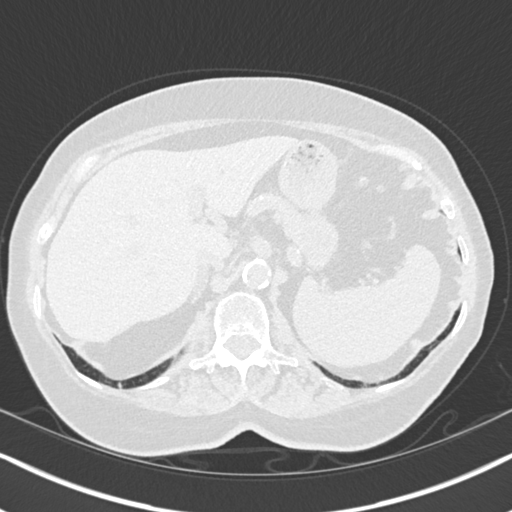
[im 36/159  lung]
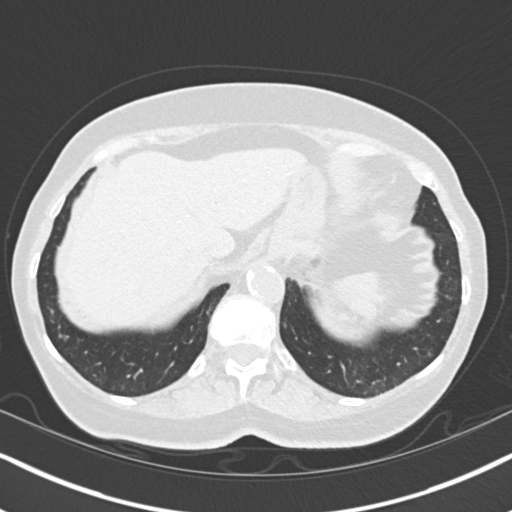
[im 47/159  lung]
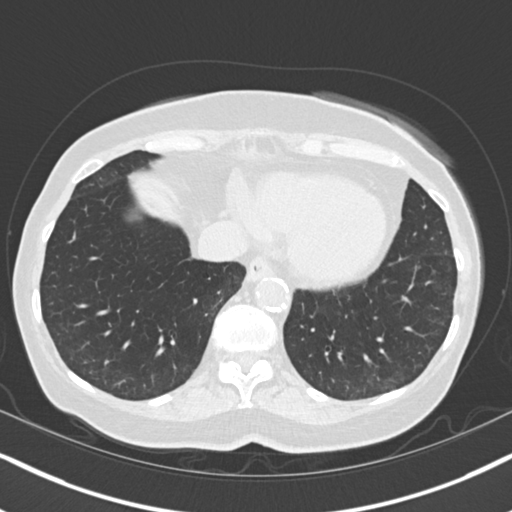
[im 59/159  mediastinal]
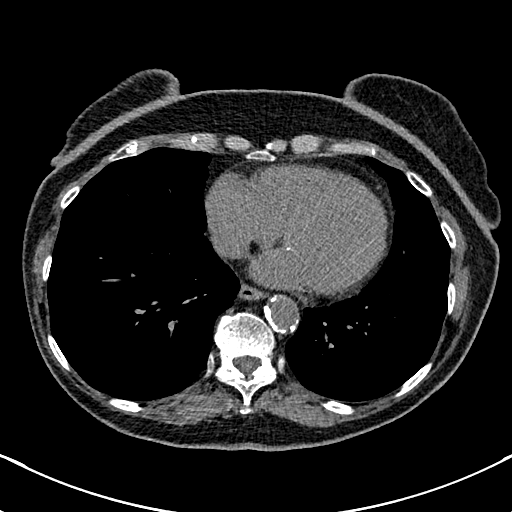
[im 59/159  lung]
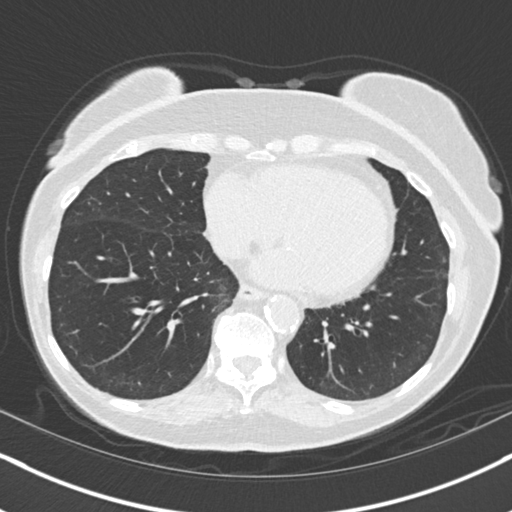
[im 71/159  lung]
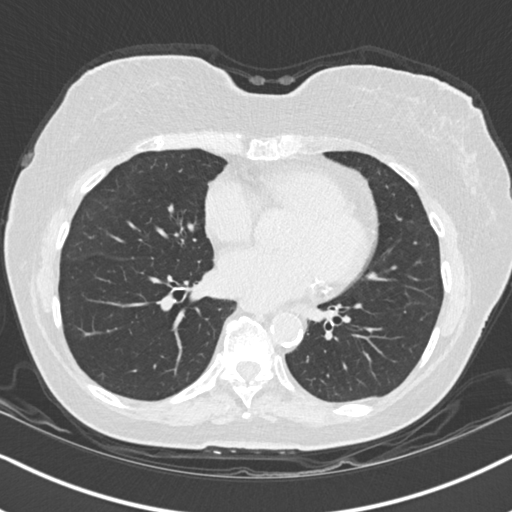
[im 88/159  lung]
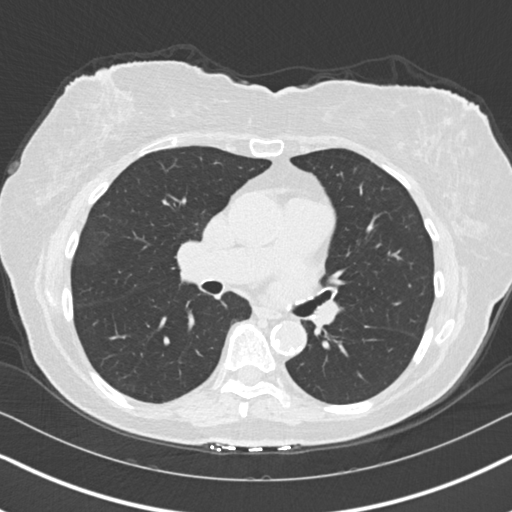
[im 100/159  lung]
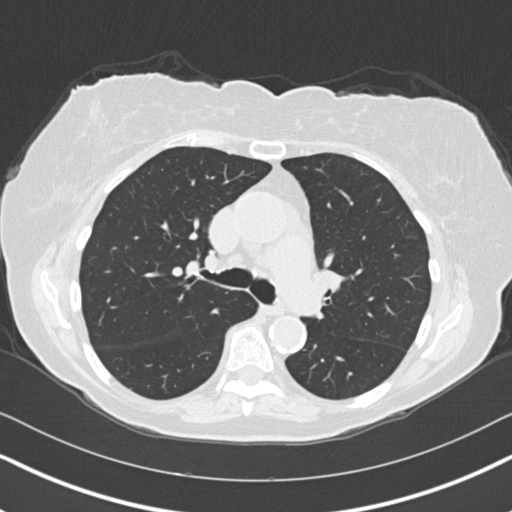
[im 112/159  mediastinal]
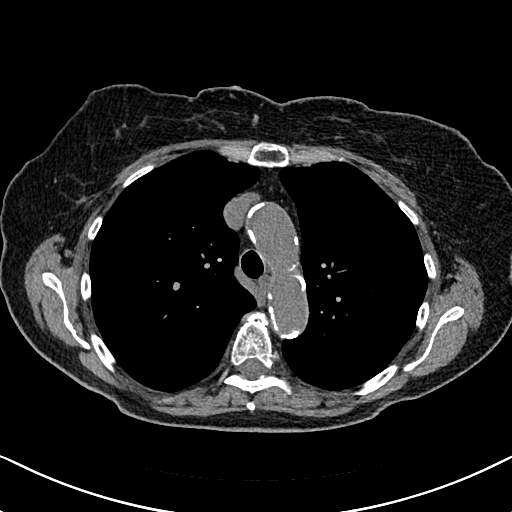
[im 112/159  lung]
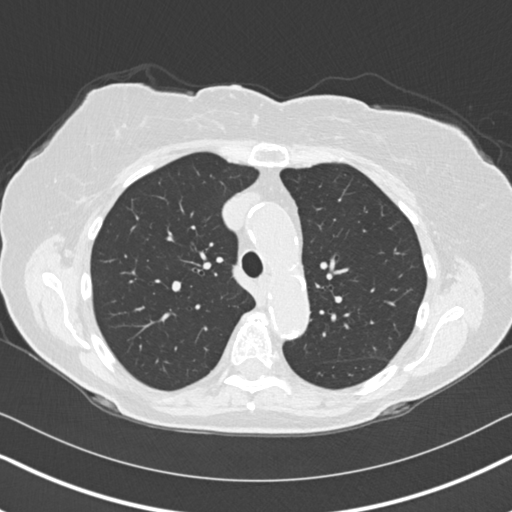
[im 123/159  lung]
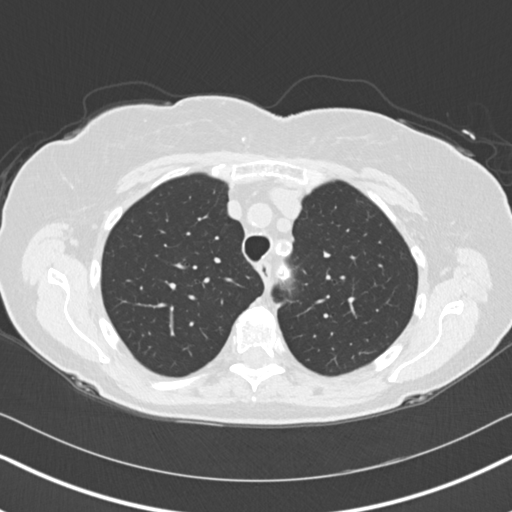
[im 135/159  lung]
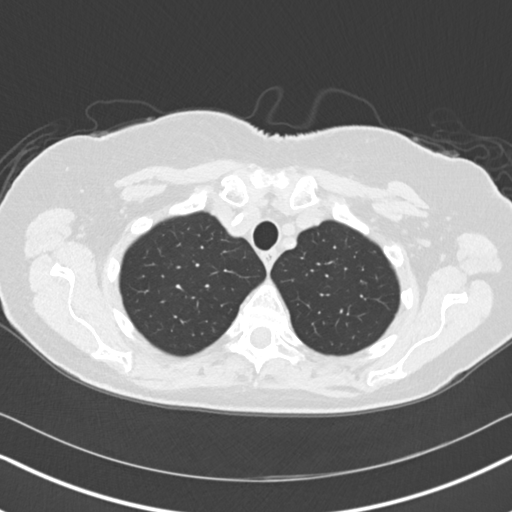
[im 147/159  lung]
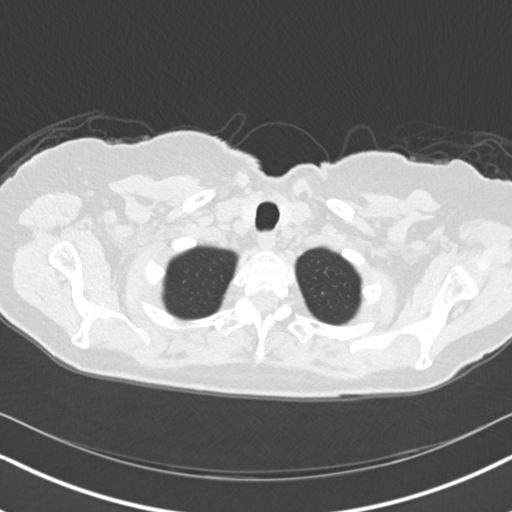

[Series 5: coronal · coronal · 0.64mm/px · 3 of 120 slices shown]
[im 24/120  lung]
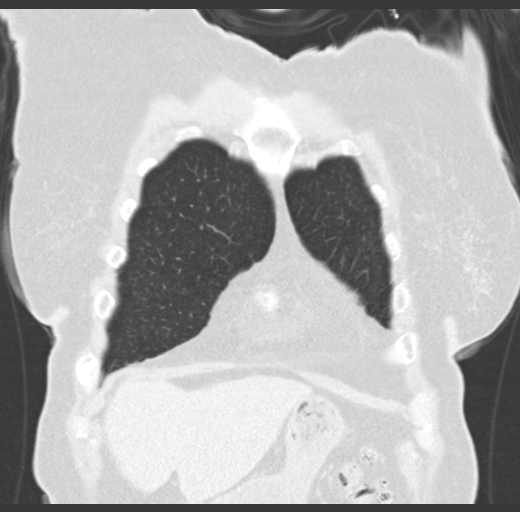
[im 48/120  lung]
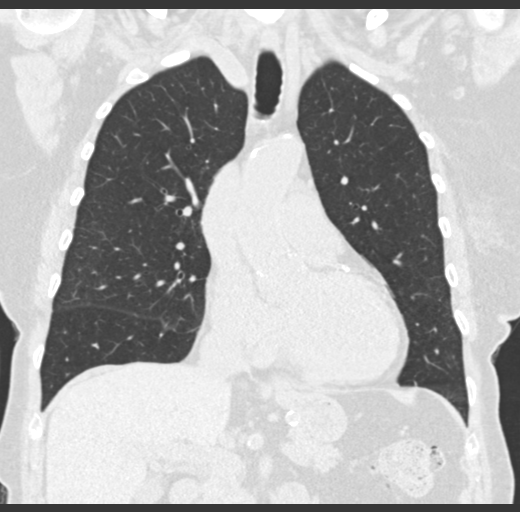
[im 72/120  lung]
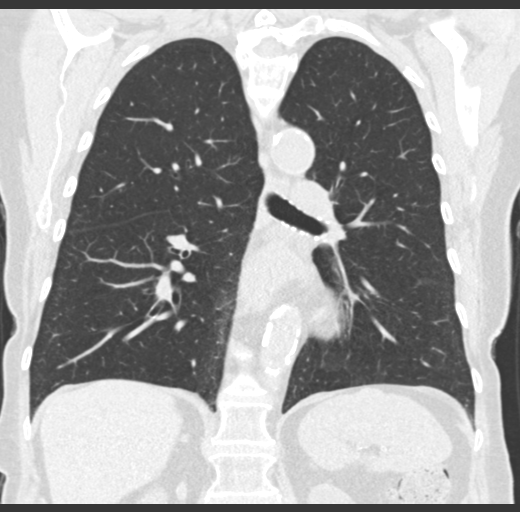

[15 of 36 positions shown; findings below may reference images not displayed]

FINDINGS: Cardiovascular: Normal heart size. No significant pericardial
effusion/thickening. Left anterior descending and left circumflex
coronary atherosclerosis. Atherosclerotic nonaneurysmal thoracic
aorta. Normal caliber pulmonary arteries.

Mediastinum/Nodes: No discrete thyroid nodules. Unremarkable
esophagus. No pathologically enlarged axillary, mediastinal or hilar
lymph nodes, noting limited sensitivity for the detection of hilar
adenopathy on this noncontrast study.

Lungs/Pleura: No pneumothorax. No pleural effusion. No acute
consolidative airspace disease or lung masses. Small perifissural
nodules along the right major fissure measuring up to 4 mm (series
3/image 82) are unchanged since 9553 chest CT, considered benign.
Stable tiny calcified posterior right middle lobe granuloma. No new
significant pulmonary nodules. Previously visualized indistinct
opacity in the apical left upper lobe on 01/29/2019 cervical spine
CT has resolved. There is minimal patchy subpleural reticulation and
ground-glass opacity in mid to lower lungs without significant
regions of traction bronchiectasis, architectural distortion or
frank honeycombing. Comparison to the prior chest CT study is
limited by hypoventilatory change on the prior scan.

Upper abdomen: Small hiatal hernia.  Marked colonic diverticulosis.

Musculoskeletal: No aggressive appearing focal osseous lesions.
Mild-to-moderate thoracic spondylosis.
IMPRESSION: 1. Apical left upper lobe indistinct opacity on 01/29/2019 cervical
spine CT has resolved.
2. Minimal patchy subpleural reticulation and ground-glass opacity
in both lungs with a lower lung predominance. No bronchiectasis or
honeycombing. Findings are nonspecific and most likely due to
minimal nonspecific postinfectious/postinflammatory scarring, with a
minimal/early interstitial lung disease including nonspecific
interstitial pneumonia (NSIP) or usual interstitial pneumonia (UIP)
not entirely excluded. Consider follow-up high-resolution chest CT
study in 12 months as clinically warranted, to assess temporal
pattern stability.
3. Two-vessel coronary atherosclerosis.
4. Small hiatal hernia.
5. Marked colonic diverticulosis.

Aortic Atherosclerosis ([id number]).

## 2022-01-13 ENCOUNTER — Ambulatory Visit
Admission: RE | Admit: 2022-01-13 | Discharge: 2022-01-13 | Disposition: A | Discharge status: Home / Self Care | Payer: Medicare Other | Source: Ambulatory Visit | Attending: Emergency Medicine | Admitting: Emergency Medicine

## 2022-01-13 DIAGNOSIS — J849 Interstitial pulmonary disease, unspecified: Secondary | ICD-10-CM

## 2022-01-18 ENCOUNTER — Encounter: Payer: Self-pay | Admitting: Nurse Practitioner

## 2022-01-18 ENCOUNTER — Ambulatory Visit: Payer: Medicare Other | Admitting: Nurse Practitioner

## 2022-01-18 VITALS — BP 122/57 | HR 77 | Ht 61.0 in | Wt 145.0 lb

## 2022-01-18 DIAGNOSIS — K5900 Constipation, unspecified: Secondary | ICD-10-CM

## 2022-01-18 NOTE — Patient Instructions (Addendum)
Try glycerin suppositories as needed.  Try to elevated feet on a 6 inch stool while having a bowel movment Continue Miralax 2-3 times a week.  Call my nurse Vernona Rieger at [phone] in a couple of weeks to let me know how things are going.   If you are age 86 or older, your body mass index should be between 23-30. Your Body mass index is 27.4 kg/m. If this is out of the aforementioned range listed, please consider follow up with your Primary Care Provider.  If you are age 69 or younger, your body mass index should be between 19-25. Your Body mass index is 27.4 kg/m. If this is out of the aformentioned range listed, please consider follow up with your Primary Care Provider.   ________________________________________________________  The [facility] providers would like to encourage you to use [facility] to communicate with providers for non-urgent requests or questions.  Due to long hold times on the telephone, sending your provider a message by [facility] may be a faster and more efficient way to get a response.  Please allow 48 business hours for a response.  Please remember that this is for non-urgent requests.  _______________________________________________________  Thank you for choosing me and [facility].  Willette Cluster NP

## 2022-01-18 NOTE — Progress Notes (Signed)
Assessment &  Plan   Patient is an 88 yo female with a PMH significant for ILD, chronic GERD, diverticulosis colon resection ( ? For diverticular disease), chronic constipation  # Chronic constipation with significant difficulty in evacuation of stool most likely leading to incomplete evacuation with occasional overflow leakage.  DRE in April remarkable for a sluggish pelvic descent when bearing down. Most likely symptoms are pelvic floor dysfunction related but could also have a cystocele .   She forgot about trying the glycerin suppositories we discussed at last visit. She will try those  Elevate feet on stool 6 inches during defecation Continue Miralax 2-3 times a week Call me in a few weeks with an update. If not improving will refer for pelvic floor PT. Consider barium enema to evaluate for a rectocele though not sure it would change management  HPI   Chief Complaint : follow up on bowel problems   Tina [name] was chronic problems with incomplete evacuation evacuation and intermittent fecal seepage.  She has been tried a fiber supplements at various times.  I saw her early May at which time she described sensation of incomplete rectal emptying /difficult evacuation sometimes requiring digital maneuvers.  I thought she was overall constipated.  She agreed to retry fiber supplements and take MiraLAX 2-3 times a week.  We also talked about glycerin suppositories to help with emptying the rectum.  Plan was to return for follow-up and if not better then consider pelvic floor physical therapy  Interval History:  She stopped the fiber. It made her stools too hard. She forgot about trying Glycerin supp that we discussed last visit. She is taking Miralax a couple of times a week. Sometimes has "rabbit pellets" , other times stools are formed. She hasn't had any fecal leakage since I saw her. She wants to be able to have a BM everyday without taking something. Her main issue is the difficult time  that she has evacuating stools. Often has to use digital manipulation to have a BM.    Past Medical History:  Diagnosis Date   Canker sore    Cystocele    Depression    Diverticulosis    Esophageal dysmotility    Esophageal stricture    GERD (gastroesophageal reflux disease)    Heartburn    Hypertension    TIA (transient ischemic attack)     Past Surgical History:  Procedure Laterality Date   APPENDECTOMY     BRAVO PH STUDY N/A 01/01/2020   Procedure: BRAVO PH STUDY- 48hrs;  Surgeon: Napoleon Form, MD;  Location: [facility];  Service: Endoscopy;  Laterality: N/A;   CATARACT EXTRACTION     COLON SURGERY     ESOPHAGOGASTRODUODENOSCOPY N/A 01/01/2020   Procedure: ESOPHAGOGASTRODUODENOSCOPY (EGD);  Surgeon: Napoleon Form, MD;  Location: [facility];  Service: Endoscopy;  Laterality: N/A;   JOINT REPLACEMENT     PARTIAL COLECTOMY  09/1991   due to diverticulitis   SHOULDER SURGERY     right shoulder- removal calcium deposit   TOTAL HIP ARTHROPLASTY  6/03   TOTAL HIP ARTHROPLASTY Left 02/24/2016   Procedure: LEFT TOTAL HIP ARTHROPLASTY ANTERIOR APPROACH;  Surgeon: [name], MD;  Location: [facility];  Service: Orthopedics;  Laterality: Left;    Current Medications, Allergies, Family History and Social History were reviewed in [employer] record.     Current Outpatient Medications  Medication Sig Dispense Refill   acetaminophen (TYLENOL) 500 MG tablet Take 500 mg by  mouth every 6 (six) hours as needed for mild pain, moderate pain, fever or headache.     albuterol (VENTOLIN HFA) 108 (90 Base) MCG/ACT inhaler Inhale 2 puffs into the lungs every 6 (six) hours as needed for wheezing or shortness of breath.     atorvastatin (LIPITOR) 10 MG tablet Take 10 mg by mouth daily at 6 PM. ON HOLD 04/23/21     BIOTIN PO Take 1 tablet by mouth daily.     Calcium Carbonate-Vitamin D 600-400 MG-UNIT tablet Take 1 tablet by mouth daily.     cholecalciferol  (VITAMIN D) 1000 units tablet Take 1,000 Units by mouth every other day.     estradiol (ESTRACE) 0.1 MG/GM vaginal cream SMARTSIG:1 Vaginal 3 Times a Week     losartan (COZAAR) 25 MG tablet TAKE 1 TABLET BY MOUTH  DAILY 90 tablet 3   mirtazapine (REMERON) 7.5 MG tablet Take 7.5 mg by mouth at bedtime.     nitroGLYCERIN (NITROSTAT) 0.4 MG SL tablet Place 1 tablet (0.4 mg total) under the tongue every 5 (five) minutes as needed for chest pain. 25 tablet 3   Polyethyl Glycol-Propyl Glycol (SYSTANE OP) Place 1 drop into both eyes daily as needed (dry eyes/ allergies).     polyethylene glycol (MIRALAX / GLYCOLAX) packet Take 17 g by mouth daily as needed for mild constipation.     No current facility-administered medications for this visit.    Review of Systems: No chest pain. No shortness of breath. No urinary complaints.    Physical Exam  Wt Readings from Last 3 Encounters:  01/18/22 145 lb (65.8 kg)  12/21/21 142 lb 3.2 oz (64.5 kg)  12/07/21 143 lb (64.9 kg)    BP (!) 122/57   Pulse 77   Ht 5\' 1"  (1.549 m)   Wt 145 lb (65.8 kg)   BMI 27.40 kg/m  Constitutional:  Generally well appearing female in no acute distress. Psychiatric: Pleasant. Normal mood and affect. Behavior is normal. EENT: Pupils normal.  Conjunctivae are normal. No scleral icterus. Neck supple.  Cardiovascular: Normal rate, regular rhythm. No edema Pulmonary/chest: Effort normal and breath sounds normal. No wheezing, rales or rhonchi. Abdominal: Soft, nondistended, nontender. Bowel sounds active throughout. There are no masses palpable. No hepatomegaly. Neurological: Alert and oriented to person place and time. Skin: Skin is warm and dry. No rashes noted.  , NP  01/18/2022, 2:27 PM

## 2022-03-08 ENCOUNTER — Ambulatory Visit: Payer: Medicare Other | Admitting: Emergency Medicine

## 2022-03-08 ENCOUNTER — Encounter: Payer: Self-pay | Admitting: Emergency Medicine

## 2022-03-08 DIAGNOSIS — J849 Interstitial pulmonary disease, unspecified: Secondary | ICD-10-CM

## 2022-03-08 NOTE — Patient Instructions (Signed)
We reviewed your CT scan of the chest today. Please call our office if you develop any new symptoms including progressive shortness of breath, increased cough. Follow Dr. Delton Coombes in 1 year or sooner if you have any problems.  We will decide at that time whether we need to repeat your chest imaging.

## 2022-03-08 NOTE — Progress Notes (Signed)
Subjective:    Patient ID: Tina [name], female    DOB: 1930-11-28, 88 y.o.   MRN: [id number]  HPI 88 year old patient with history of minimal tobacco use (7.5 pack years), hypertension, hyperlipidemia, CAD, GERD with esophageal strictures and dysmotility, TIAs, chronic cough.  She has been followed in our office by Dr. Isaiah Serge for some mild peripheral interstitial lung disease noted on CT chest that was done 08/2019 after a fall.  No clear etiology identified.  Her exposure profile was reassuring.  She did have a positive ANA 1: 40 of unclear significance. She describes chronic cough, has improved some over time, still happens sometimes, several times a week. She has intermittent GERD, is no longer on meds for this. Not on scheduled rhinitis meds either. She has used hydrocodone cough syrup rarely in the past.   Pulmonary function testing 11/07/2019 reviewed by me show grossly normal airflows without a significant bronchodilator response, normal TLC, decreased diffusion capacity.  High-resolution CT scan of the chest 09/03/2020 reviewed, showed minimal irregular peripheral interstitial opacity, somewhat improved compared with her prior scan 08/2019.  No overt ILD changes   ROV 03/08/22 --follow-up visit for 88 year old woman with history of minimal tobacco use.  I have followed her for interstitial lung disease of unclear etiology.  Reassuring exposure profile, overall reassuring autoimmune labs except for minimally positive ANA of unclear significance.  She has chronic cough, intermittent GERD with some esophageal strictures and dysmotility.  We repeated her high-resolution CT scan of the chest Overall she believes she is doing well. She is able to exert, be active, although she is limited by some hip pain. Her cough is better - not happening frequently, not on rhinitis or GERD regimen.   High-resolution CT chest 01/13/2022 reviewed by me shows mild groundglass attenuation peripherally in the mid to  lower lungs with some minimal septal thickening.  There is no traction bronchiectasis or honeycomb change.  No evidence of air trapping.  Possible slight increase compared with 09/2020   Review of Systems As per HPi  Past Medical History:  Diagnosis Date   Canker sore    Cystocele    Depression    Diverticulosis    Esophageal dysmotility    Esophageal stricture    GERD (gastroesophageal reflux disease)    Heartburn    Hypertension    TIA (transient ischemic attack)      Family History  Problem Relation Age of Onset   Cancer Paternal Aunt    Cancer Paternal Uncle    Diabetes Father    Heart disease Father    Cancer Father    Diabetes Sister    Heart disease Sister    Leukemia Sister    Heart disease Brother     Family Hx lung CA  Social History   Socioeconomic History   Marital status: Married    Spouse name: Not on file   Number of children: Not on file   Years of education: Not on file   Highest education level: Not on file  Occupational History   Not on file  Tobacco Use   Smoking status: Former    Packs/day: 0.50    Years: 15.00    Total pack years: 7.50    Types: Cigarettes    Quit date: 08/02/1963    Years since quitting: 58.6   Smokeless tobacco: Never  Vaping Use   Vaping Use: Never used  Substance and Sexual Activity   Alcohol use: No   Drug use: No  Sexual activity: Not on file  Other Topics Concern   Not on file  Social History Narrative   Not on file   Social Determinants of Health   Financial Resource Strain: Not on file  Food Insecurity: Not on file  Transportation Needs: Not on file  Physical Activity: Not on file  Stress: Not on file  Social Connections: Not on file  Intimate Partner Violence: Not on file     Allergies  Allergen Reactions   Valdecoxib Other (See Comments)    Reaction:  Unknown    Prednisone Rash     Outpatient Medications Prior to Visit  Medication Sig Dispense Refill   acetaminophen (TYLENOL) 500 MG  tablet Take 500 mg by mouth every 6 (six) hours as needed for mild pain, moderate pain, fever or headache.     albuterol (VENTOLIN HFA) 108 (90 Base) MCG/ACT inhaler Inhale 2 puffs into the lungs every 6 (six) hours as needed for wheezing or shortness of breath.     amoxicillin (AMOXIL) 500 MG capsule SMARTSIG:4 Capsule(s) By Mouth Once     atorvastatin (LIPITOR) 10 MG tablet Take 10 mg by mouth daily at 6 PM. ON HOLD 04/23/21     BIOTIN PO Take 1 tablet by mouth daily.     Calcium Carbonate-Vitamin D 600-400 MG-UNIT tablet Take 1 tablet by mouth daily.     cholecalciferol (VITAMIN D) 1000 units tablet Take 1,000 Units by mouth every other day.     estradiol (ESTRACE) 0.1 MG/GM vaginal cream SMARTSIG:1 Vaginal 3 Times a Week     losartan (COZAAR) 25 MG tablet TAKE 1 TABLET BY MOUTH  DAILY 90 tablet 3   mirtazapine (REMERON) 7.5 MG tablet Take 7.5 mg by mouth at bedtime.     nitroGLYCERIN (NITROSTAT) 0.4 MG SL tablet Place 1 tablet (0.4 mg total) under the tongue every 5 (five) minutes as needed for chest pain. 25 tablet 3   Polyethyl Glycol-Propyl Glycol (SYSTANE OP) Place 1 drop into both eyes daily as needed (dry eyes/ allergies).     polyethylene glycol (MIRALAX / GLYCOLAX) packet Take 17 g by mouth daily as needed for mild constipation.     No facility-administered medications prior to visit.          Objective:   Physical Exam Vitals:   03/08/22 1616  BP: 124/68  Pulse: 73  Temp: 98 F (36.7 C)  TempSrc: Oral  SpO2: 98%  Weight: 146 lb 3.2 oz (66.3 kg)  Height: 5\' 3"  (1.6 m)   Gen: Pleasant, well-nourished, in no distress,  normal affect  ENT: No lesions,  mouth clear,  oropharynx clear, no postnasal drip  Neck: No JVD, no stridor  Lungs: No use of accessory muscles, mostly clear, few basilar insp crackles.  Cardiovascular: RRR, heart sounds normal, no murmur or gallops, no peripheral edema  Musculoskeletal: No deformities, no cyanosis or clubbing  Neuro: alert,  awake, non focal  Skin: Warm, no lesions or rash      Assessment & Plan:  ILD (interstitial lung disease) (HCC) Dependent groundglass changes, some subtle reticulation with little change compared with priors, probably some slight progression.  She is asymptomatic, has good functional capacity.  I think we can hold off on scheduling a repeat scan for now.  I will see her in 1 year and depending on symptoms determine whether repeat imaging is necessary.   , MD, PhD 03/08/2022, 5:36 PM [facility] [phone] or if no answer before 7:00PM  call [phone] For any issues after 7:00PM please call eLink [phone]

## 2022-03-08 NOTE — Assessment & Plan Note (Signed)
Dependent groundglass changes, some subtle reticulation with little change compared with priors, probably some slight progression.  She is asymptomatic, has good functional capacity.  I think we can hold off on scheduling a repeat scan for now.  I will see her in 1 year and depending on symptoms determine whether repeat imaging is necessary.

## 2022-04-17 ENCOUNTER — Encounter: Payer: Self-pay | Admitting: Cardiovascular Disease

## 2022-04-17 NOTE — Progress Notes (Unsigned)
Cardiology Office Note:    Date:  04/18/2022   ID:  Tina [name], DOB 27-Apr-1931, MRN [id number]  PCP:  Tally Joe, MD  Cardiologist:  Marques Ericson  Electrophysiologist:  None   Referring MD: Tally Joe, MD   Chief Complaint  Patient presents with   Coronary Artery Disease        Shortness of Breath  I   :    Tina [name] is a 88 y.o. female with a hx of CAD.    She was referred to me by Dr. Yetta [name] for further management of her CAD  Seen with her daughter, Tina [name].   She is seen frequently by the pulmonary group for her ILD. I saw her about 10 years ago   She fell and broke her arm last year. The CT scan showed interstitial lung disease.  She was incidentally found to have coronary artery calcifications   Does have some intrascapular CP with walking or when she gets tired.   Resolves with rest  No syncope   Has a chronic cough Does not do any regular exercise.   Does her house work .  Has occasional falling episdoses from dizziness and being unsteady.  Has symptoms c/w orthostasis.   February 17, 2020  Tina [name] presents for follow up of her CP, hypertipidemia lexiscan myoview was negative for ishemia and revealed normal LV function 30 day event monitor revealed no significant arrhythmais   No recent episodes of CP  Still has some intrascapular pain - mostly in the late afternoon when she is getting tired.  Does not seem to be related to exertion .   October 16, 2020: Tina [name] continues to have intermittent CP , Will give her some NTG to see if they help If so, will need to consider a cath  She is active around the house, no significant exercise  Still working through [employer] ( passed away  14-Sep-2020 )    Sept 23, 2022:  Tina [name] is seen today for follow up visit  She was having symptoms of chest discomfort when I last saw her.  We gave her a prescription for nitroglycerin.  We discussed doing a heart catheterization but this has not been performed  yet.  She is having some indigestion after eating .   At night  Resolves fairly quickly . Has a new complaint of "bones feeling heavy or aching " Has been on atorvastatin for a long time We discussed having her stop the atorvastatin for a month or so     Sept. 18, 2023  Tina [name] is seen for follow up of her dyspnea.  Has indigestion like pain  Aches in her bones  She has interstitial lung disease - followed by Tina [name], Loses her balance easily,  especially if she spins around  No syncope  No CP    Past Medical History:  Diagnosis Date   Canker sore    Cystocele    Depression    Diverticulosis    Esophageal dysmotility    Esophageal stricture    GERD (gastroesophageal reflux disease)    Heartburn    Hypertension    TIA (transient ischemic attack)     Past Surgical History:  Procedure Laterality Date   APPENDECTOMY     BRAVO PH STUDY N/A 01/01/2020   Procedure: BRAVO PH STUDY- 48hrs;  Surgeon: Napoleon Form, MD;  Location: [facility];  Service: Endoscopy;  Laterality: N/A;   CATARACT EXTRACTION     COLON SURGERY  ESOPHAGOGASTRODUODENOSCOPY N/A 01/01/2020   Procedure: ESOPHAGOGASTRODUODENOSCOPY (EGD);  Surgeon: Napoleon FormNandigam, Kavitha V, MD;  [facility];  Service: Endoscopy;  Laterality: N/A;   JOINT REPLACEMENT     PARTIAL COLECTOMY  09/1991   due to diverticulitis   SHOULDER SURGERY     right shoulder- removal calcium deposit   TOTAL HIP ARTHROPLASTY  6/03   TOTAL HIP ARTHROPLASTY Left 02/24/2016   Procedure: LEFT TOTAL HIP ARTHROPLASTY ANTERIOR APPROACH;  Surgeon: Ollen GrossFrank Aluisio, MD;  Location: [facility];  Service: Orthopedics;  Laterality: Left;    Current Medications: Current Meds  Medication Sig   acetaminophen (TYLENOL) 500 MG tablet Take 500 mg by mouth every 6 (six) hours as needed for mild pain, moderate pain, fever or headache.   albuterol (VENTOLIN HFA) 108 (90 Base) MCG/ACT inhaler Inhale 2 puffs into the lungs every 6 (six) hours as needed  for wheezing or shortness of breath.   atorvastatin (LIPITOR) 10 MG tablet Take 10 mg by mouth daily at 6 PM. ON HOLD 04/23/21   BIOTIN PO Take 1 tablet by mouth daily.   Calcium Carbonate-Vitamin D 600-400 MG-UNIT tablet Take 1 tablet by mouth daily.   cholecalciferol (VITAMIN D) 1000 units tablet Take 1,000 Units by mouth every other day.   estradiol (ESTRACE) 0.1 MG/GM vaginal cream SMARTSIG:1 Vaginal 3 Times a Week   losartan (COZAAR) 25 MG tablet TAKE 1 TABLET BY MOUTH  DAILY   mirtazapine (REMERON) 7.5 MG tablet Take 7.5 mg by mouth at bedtime.   nitroGLYCERIN (NITROSTAT) 0.4 MG SL tablet Place 1 tablet (0.4 mg total) under the tongue every 5 (five) minutes as needed for chest pain.   Polyethyl Glycol-Propyl Glycol (SYSTANE OP) Place 1 drop into both eyes daily as needed (dry eyes/ allergies).   polyethylene glycol (MIRALAX / GLYCOLAX) packet Take 17 g by mouth daily as needed for mild constipation.     Allergies:   Valdecoxib and Prednisone   Social History   Socioeconomic History   Marital status: Married    Spouse name: Not on file   Number of children: Not on file   Years of education: Not on file   Highest education level: Not on file  Occupational History   Not on file  Tobacco Use   Smoking status: Former    Packs/day: 0.50    Years: 15.00    Total pack years: 7.50    Types: Cigarettes    Quit date: 08/02/1963    Years since quitting: 58.7   Smokeless tobacco: Never  Vaping Use   Vaping Use: Never used  Substance and Sexual Activity   Alcohol use: No   Drug use: No   Sexual activity: Not on file  Other Topics Concern   Not on file  Social History Narrative   Not on file   Social Determinants of Health   Financial Resource Strain: Not on file  Food Insecurity: Not on file  Transportation Needs: Not on file  Physical Activity: Not on file  Stress: Not on file  Social Connections: Not on file     Family History: The patient's family history includes  Cancer in her father, paternal aunt, and paternal uncle; Diabetes in her father and sister; Heart disease in her brother, father, and sister; Leukemia in her sister.  ROS:   Please see the history of present illness.     All other systems reviewed and are negative.  EKGs/Labs/Other Studies Reviewed:    The following studies were reviewed today:  Recent Labs: 04/23/2021: BUN 29; Creatinine, Ser 1.00; Potassium 4.4; Sodium 143  Recent Lipid Panel    Component Value Date/Time   CHOL 157 02/17/2020 1027   TRIG 75 02/17/2020 1027   HDL 59 02/17/2020 1027   CHOLHDL 2.7 02/17/2020 1027   LDLCALC 84 02/17/2020 1027    Physical Exam:    Physical Exam: Blood pressure 128/60, pulse 66, height 5\' 3"  (1.6 m), weight 145 lb 12.8 oz (66.1 kg), SpO2 96 %.       GEN:  Well nourished, well developed in no acute distress HEENT: Normal NECK: No JVD; No carotid bruits LYMPHATICS: No lymphadenopathy CARDIAC: RRR , no murmurs, rubs, gallops RESPIRATORY:  Clear to auscultation without rales, wheezing or rhonchi  ABDOMEN: Soft, non-tender, non-distended MUSCULOSKELETAL:  No edema; No deformity  SKIN: Warm and dry NEUROLOGIC:  Alert and oriented x 3   ECG :   Sept. 18, 2023.  NSR at 66.  No ST or T wave changes.   ASSESSMENT:    1. Coronary artery calcification   2. Primary hypertension   3. Mixed hyperlipidemia      PLAN:    1.  Generalized aches and pains:  due to aging  Is falling occasionally, loses her balance  No syncope    2.   Hypertension: .    BP is well controlled.   3.  . Hyperlipidemia:  lipids look good.   Managed by primary care.    Medication Adjustments/Labs and Tests Ordered: Current medicines are reviewed at length with the patient today.  Concerns regarding medicines are outlined above.  Orders Placed This Encounter  Procedures   EKG 12-Lead    No orders of the defined types were placed in this encounter.     Patient Instructions  Medication  Instructions:  Your physician recommends that you continue on your current medications as directed. Please refer to the Current Medication list given to you today.  *If you need a refill on your cardiac medications before your next appointment, please call your pharmacy*   Lab Work: none If you have labs (blood work) drawn today and your tests are completely normal, you will receive your results only by: [place] (if you have MyChart) OR A paper copy in the mail If you have any lab test that is abnormal or we need to change your treatment, we will call you to review the results.   Testing/Procedures: none   Follow-Up: At [facility], you and your health needs are our priority.  As part of our continuing mission to provide you with exceptional heart care, we have created designated Provider Care Teams.  These Care Teams include your primary Cardiologist (physician) and Advanced Practice Providers (APPs -  Physician Assistants and Nurse Practitioners) who all work together to provide you with the care you need, when you need it.  We recommend signing up for the patient portal called "MyChart".  Sign up information is provided on this After Visit Summary.  MyChart is used to connect with patients for Virtual Visits (Telemedicine).  Patients are able to view lab/test results, encounter notes, upcoming appointments, etc.  Non-urgent messages can be sent to your provider as well.   To learn more about what you can do with MyChart, go to [email or web address].    Your next appointment:   12 month(s)  The format for your next appointment:   In Person  Provider:   Mertie Moores, MD     Other Instructions  Important Information About Sugar         Signed, Kristeen Miss, MD  04/18/2022 1:22 PM    [facility]

## 2022-04-18 ENCOUNTER — Ambulatory Visit: Payer: Medicare Other | Attending: Cardiovascular Disease | Admitting: Cardiovascular Disease

## 2022-04-18 ENCOUNTER — Encounter: Payer: Self-pay | Admitting: Cardiovascular Disease

## 2022-04-18 VITALS — BP 128/60 | HR 66 | Ht 63.0 in | Wt 145.8 lb

## 2022-04-18 DIAGNOSIS — E782 Mixed hyperlipidemia: Secondary | ICD-10-CM

## 2022-04-18 DIAGNOSIS — I2510 Atherosclerotic heart disease of native coronary artery without angina pectoris: Secondary | ICD-10-CM | POA: Diagnosis not present

## 2022-04-18 DIAGNOSIS — I10 Essential (primary) hypertension: Secondary | ICD-10-CM | POA: Diagnosis not present

## 2022-04-18 DIAGNOSIS — I2584 Coronary atherosclerosis due to calcified coronary lesion: Secondary | ICD-10-CM | POA: Diagnosis not present

## 2022-04-18 DIAGNOSIS — E785 Hyperlipidemia, unspecified: Secondary | ICD-10-CM | POA: Insufficient documentation

## 2022-04-18 NOTE — Patient Instructions (Signed)
Medication Instructions:  Your physician recommends that you continue on your current medications as directed. Please refer to the Current Medication list given to you today.  *If you need a refill on your cardiac medications before your next appointment, please call your pharmacy*   Lab Work: none If you have labs (blood work) drawn today and your tests are completely normal, you will receive your results only by: MyChart Message (if you have MyChart) OR A paper copy in the mail If you have any lab test that is abnormal or we need to change your treatment, we will call you to review the results.   Testing/Procedures: none   Follow-Up: At [facility], you and your health needs are our priority.  As part of our continuing mission to provide you with exceptional heart care, we have created designated Provider Care Teams.  These Care Teams include your primary Cardiologist (physician) and Advanced Practice Providers (APPs -  Physician Assistants and Nurse Practitioners) who all work together to provide you with the care you need, when you need it.  We recommend signing up for the patient portal called "MyChart".  Sign up information is provided on this After Visit Summary.  MyChart is used to connect with patients for Virtual Visits (Telemedicine).  Patients are able to view lab/test results, encounter notes, upcoming appointments, etc.  Non-urgent messages can be sent to your provider as well.   To learn more about what you can do with MyChart, go to [email or web address].    Your next appointment:   12 month(s)  The format for your next appointment:   In Person  Provider:   Philip Nahser, MD     Other Instructions    Important Information About Sugar       

## 2022-04-28 ENCOUNTER — Other Ambulatory Visit: Payer: Self-pay | Admitting: Cardiovascular Disease

## 2023-02-28 ENCOUNTER — Other Ambulatory Visit: Payer: Self-pay | Admitting: Cardiovascular Disease

## 2023-03-21 ENCOUNTER — Other Ambulatory Visit: Payer: Self-pay | Admitting: Cardiovascular Disease

## 2023-08-13 ENCOUNTER — Encounter: Payer: Self-pay | Admitting: Cardiovascular Disease

## 2023-08-13 NOTE — Progress Notes (Signed)
 Cardiology Office Note:    Date:  08/15/2023   ID:  Tina [name], DOB 1931-01-25, MRN [id number]  PCP:  Seabron Lenis, MD  Cardiologist:  Earnest Mcgillis  Electrophysiologist:  None   Referring MD: Seabron Lenis, MD   Chief Complaint  Patient presents with   coronary artery calcification  I   :    Tina [name] is a 89 y.o. female with a hx of CAD.    She was referred to me by Dr. Joshua for further management of her CAD  Seen with her daughter, Arland.   She is seen frequently by the pulmonary group for her ILD. I saw her about 10 years ago   She fell and broke her arm last year. The CT scan showed interstitial lung disease.  She was incidentally found to have coronary artery calcifications   Does have some intrascapular CP with walking or when she gets tired.   Resolves with rest  No syncope   Has a chronic cough Does not do any regular exercise.   Does her house work .  Has occasional falling episdoses from dizziness and being unsteady.  Has symptoms c/w orthostasis.   February 17, 2020  Tina [name] presents for follow up of her CP, hypertipidemia lexiscan  myoview  was negative for ishemia and revealed normal LV function 30 day event monitor revealed no significant arrhythmais   No recent episodes of CP  Still has some intrascapular pain - mostly in the late afternoon when she is getting tired.  Does not seem to be related to exertion .   October 16, 2020: Tina [name] continues to have intermittent CP , Will give her some NTG to see if they help If so, will need to consider a cath  She is active around the house, no significant exercise  Still working through [employer] ( passed away  08-27-2020 )    Sept 23, 2022:  Tina [name] is seen today for follow up visit  She was having symptoms of chest discomfort when I last saw her.  We gave her a prescription for nitroglycerin .  We discussed doing a heart catheterization but this has not been performed yet.  She is having some  indigestion after eating .   At night  Resolves fairly quickly . Has a new complaint of bones feeling heavy or aching  Has been on atorvastatin  for a long time We discussed having her stop the atorvastatin  for a month or so     Sept. 18, 2023  Tina [name] is seen for follow up of her dyspnea.  Has indigestion like pain  Aches in her bones  She has interstitial lung disease - followed by [facility], Loses her balance easily,  especially if she spins around  No syncope  No CP   Jan. 14, 2025 Tina [name] is seen for follow up of her dyspnea , HTN, HLD   She complains of left sided pelvic pain or him pain  I think she would benefit from a pelvic US  or perhaps CT scan of her pelvis .  She may need to see GYN  She has had a partial colectomy for diverticulitis        Past Medical History:  Diagnosis Date   Canker sore    Cystocele    Depression    Diverticulosis    Esophageal dysmotility    Esophageal stricture    GERD (gastroesophageal reflux disease)    Heartburn    Hypertension    TIA (transient ischemic  attack)     Past Surgical History:  Procedure Laterality Date   APPENDECTOMY     BRAVO PH STUDY N/A 01/01/2020   Procedure: BRAVO PH STUDY- 48hrs;  Surgeon: Shila Gustav GAILS, MD;  Location: [facility];  Service: Endoscopy;  Laterality: N/A;   CATARACT EXTRACTION     COLON SURGERY     ESOPHAGOGASTRODUODENOSCOPY N/A 01/01/2020   Procedure: ESOPHAGOGASTRODUODENOSCOPY (EGD);  Surgeon: Nandigam, Kavitha V, MD;  Location: [facility];  Service: Endoscopy;  Laterality: N/A;   JOINT REPLACEMENT     PARTIAL COLECTOMY  09/1991   due to diverticulitis   SHOULDER SURGERY     right shoulder- removal calcium  deposit   TOTAL HIP ARTHROPLASTY  6/03   TOTAL HIP ARTHROPLASTY Left 02/24/2016   Procedure: LEFT TOTAL HIP ARTHROPLASTY ANTERIOR APPROACH;  Surgeon: Dempsey Moan, MD;  Location: [facility];  Service: Orthopedics;  Laterality: Left;    Current Medications: Current Meds   Medication Sig   acetaminophen  (TYLENOL ) 500 MG tablet Take 500 mg by mouth every 6 (six) hours as needed for mild pain, moderate pain, fever or headache.   albuterol (VENTOLIN HFA) 108 (90 Base) MCG/ACT inhaler Inhale 2 puffs into the lungs every 6 (six) hours as needed for wheezing or shortness of breath.   amoxicillin (AMOXIL) 500 MG capsule    atorvastatin  (LIPITOR) 10 MG tablet Take 10 mg by mouth daily at 6 PM. ON HOLD 04/23/21   BIOTIN PO Take 1 tablet by mouth daily.   Calcium  Carbonate-Vitamin D 600-400 MG-UNIT tablet Take 1 tablet by mouth daily.   cholecalciferol (VITAMIN D) 1000 units tablet Take 1,000 Units by mouth every other day.   estradiol (ESTRACE) 0.1 MG/GM vaginal cream SMARTSIG:1 Vaginal 3 Times a Week   losartan  (COZAAR ) 25 MG tablet TAKE 1 TABLET BY MOUTH DAILY   mirtazapine (REMERON) 7.5 MG tablet Take 7.5 mg by mouth at bedtime.   nitroGLYCERIN  (NITROSTAT ) 0.4 MG SL tablet Place 1 tablet (0.4 mg total) under the tongue every 5 (five) minutes as needed for chest pain.   Polyethyl Glycol-Propyl Glycol (SYSTANE OP) Place 1 drop into both eyes daily as needed (dry eyes/ allergies).   polyethylene glycol (MIRALAX  / GLYCOLAX ) packet Take 17 g by mouth daily as needed for mild constipation.     Allergies:   Valdecoxib and Prednisone   Social History   Socioeconomic History   Marital status: Married    Spouse name: Not on file   Number of children: Not on file   Years of education: Not on file   Highest education level: Not on file  Occupational History   Not on file  Tobacco Use   Smoking status: Former    Current packs/day: 0.00    Average packs/day: 0.5 packs/day for 15.0 years (7.5 ttl pk-yrs)    Types: Cigarettes    Start date: 08/01/1948    Quit date: 08/02/1963    Years since quitting: 60.0   Smokeless tobacco: Never  Vaping Use   Vaping status: Never Used  Substance and Sexual Activity   Alcohol  use: No   Drug use: No   Sexual activity: Not on file   Other Topics Concern   Not on file  Social History Narrative   Not on file   Social Drivers of Health   Financial Resource Strain: Low Risk  (10/05/2022)   Received from [facility]   Overall Financial Resource Strain (CARDIA)    Difficulty of Paying Living Expenses: Not hard at all  Food Insecurity: No Food Insecurity (10/05/2022)   Received from [facility]   Hunger Vital Sign    Worried About Running Out of Food in the Last Year: Never true    Ran Out of Food in the Last Year: Never true  Transportation Needs: No Transportation Needs (10/05/2022)   Received from [facility] - Transportation    Lack of Transportation (Medical): No    Lack of Transportation (Non-Medical): No  Physical Activity: Not on file  Stress: Not on file  Social Connections: Unknown (11/28/2021)   Received from [facility]   Social Network    Social Network: Not on file     Family History: The patient's family history includes Cancer in her father, paternal aunt, and paternal uncle; Diabetes in her father and sister; Heart disease in her brother, father, and sister; Leukemia in her sister.  ROS:   Please see the history of present illness.     All other systems reviewed and are negative.  EKGs/Labs/Other Studies Reviewed:    The following studies were reviewed today:  Recent Labs: No results found for requested labs within last 365 days.  Recent Lipid Panel    Component Value Date/Time   CHOL 157 02/17/2020 1027   TRIG 75 02/17/2020 1027   HDL 59 02/17/2020 1027   CHOLHDL 2.7 02/17/2020 1027   LDLCALC 84 02/17/2020 1027    Physical Exam:     Physical Exam: Blood pressure (!) 160/70, pulse 86, height 5' 3 (1.6 m), weight 140 lb (63.5 kg), SpO2 95%.  HYPERTENSION CONTROL Vitals:   08/15/23 1518 08/15/23 1532  BP: (!) 194/76 (!) 160/70    The patient's blood pressure is elevated above target today.  In order to address the patient's elevated BP: Blood  pressure will be monitored at home to determine if medication changes need to be made.       GEN:  elderly female  in no acute distress HEENT: Normal NECK: No JVD; No carotid bruits LYMPHATICS: No lymphadenopathy CARDIAC: RRR , no murmurs, rubs, gallops RESPIRATORY:  Clear to auscultation without rales, wheezing or rhonchi  ABDOMEN: Soft, non-tender, non-distended Her lower abdomen / pelvis seems to be tender  MUSCULOSKELETAL:  No edema; No deformity  SKIN: Warm and dry NEUROLOGIC:  Alert and oriented x 3   ECG :    ASSESSMENT:    1. Coronary artery calcification       PLAN:    1.  Generalized aches and pains:   she complains quite a bit about lower abdominal pain / pelvic pain .   She may need an imaging study of her pelvis - ? CT scan .  Will defer to her primary MD    2.   Hypertension: .       Her BP readings at home look good  . Is elevated today .  She is in pain - which is certainly contributing to her HTN  3.  . Hyperlipidemia:     Continue current meds.    Medication Adjustments/Labs and Tests Ordered: Current medicines are reviewed at length with the patient today.  Concerns regarding medicines are outlined above.  Orders Placed This Encounter  Procedures   EKG 12-Lead    No orders of the defined types were placed in this encounter.     There are no Patient Instructions on file for this visit.   Signed, Aleene Passe, MD  08/15/2023 3:41 PM    [facility]

## 2023-08-15 ENCOUNTER — Ambulatory Visit: Payer: Medicare Other | Attending: Cardiovascular Disease | Admitting: Cardiovascular Disease

## 2023-08-15 VITALS — BP 160/70 | HR 86 | Ht 63.0 in | Wt 140.0 lb

## 2023-08-15 DIAGNOSIS — I2510 Atherosclerotic heart disease of native coronary artery without angina pectoris: Secondary | ICD-10-CM

## 2023-08-15 NOTE — Patient Instructions (Signed)
 Follow-Up: At [facility], you and your health needs are our priority.  As part of our continuing mission to provide you with exceptional heart care, we have created designated Provider Care Teams.  These Care Teams include your primary Cardiologist (physician) and Advanced Practice Providers (APPs -  Physician Assistants and Nurse Practitioners) who all work together to provide you with the care you need, when you need it.  We recommend signing up for the patient portal called "MyChart".  Sign up information is provided on this After Visit Summary.  MyChart is used to connect with patients for Virtual Visits (Telemedicine).  Patients are able to view lab/test results, encounter notes, upcoming appointments, etc.  Non-urgent messages can be sent to your provider as well.   To learn more about what you can do with MyChart, go to [email or web address].    Your next appointment:   1 year(s)  Provider:   Rollene Rotunda, MD   1st Floor: - Lobby - Registration  - Pharmacy  - Lab - Cafe  2nd Floor: - PV Lab - Diagnostic Testing (echo, CT, nuclear med)  3rd Floor: - Vacant  4th Floor: - TCTS (cardiothoracic surgery) - AFib Clinic - Structural Heart Clinic - Vascular Surgery  - Vascular Ultrasound  5th Floor: - HeartCare Cardiology (general and EP) - Clinical Pharmacy for coumadin, hypertension, lipid, weight-loss medications, and med management appointments    Valet parking services will be available as well.

## 2023-08-18 ENCOUNTER — Telehealth: Payer: Self-pay | Admitting: Cardiovascular Disease

## 2023-08-18 NOTE — Telephone Encounter (Signed)
Spoke with [name] who states that Dr. Elease Hashimoto told the patient that he was going to reach out to Dr. Azucena Cecil in regards to getting a CT or MRI of her abdomen. Advised thar Dr. Elease Hashimoto had faxed over his note when he saw the patient on 1/14. [name] states that she spoke with Dr. Maurene Capes office this morning who stated that they had not received anything from Dr. Elease Hashimoto. Advised that I will resend it.

## 2023-08-18 NOTE — Telephone Encounter (Signed)
Daughter called to say that Dr. Azucena Cecil has not received the notes that Dr. Elease Hashimoto is suppose to send to him. Please advise

## 2023-08-29 ENCOUNTER — Other Ambulatory Visit: Payer: Self-pay | Admitting: Family Medicine

## 2023-08-29 DIAGNOSIS — R1030 Lower abdominal pain, unspecified: Secondary | ICD-10-CM

## 2023-08-30 ENCOUNTER — Ambulatory Visit
Admission: RE | Admit: 2023-08-30 | Discharge: 2023-08-30 | Discharge status: Home / Self Care | Payer: Medicare Other | Source: Ambulatory Visit | Attending: Family Medicine | Admitting: Family Medicine

## 2023-08-30 DIAGNOSIS — R1030 Lower abdominal pain, unspecified: Secondary | ICD-10-CM

## 2023-08-30 MED ORDER — IOPAMIDOL (ISOVUE-370) INJECTION 76%
500.0000 mL | Freq: Once | INTRAVENOUS | Status: AC | PRN
  Administered 2023-08-30: 75 mL via INTRAVENOUS

## 2023-10-27 ENCOUNTER — Telehealth: Payer: Self-pay | Admitting: Gastroenterology

## 2023-10-27 NOTE — Telephone Encounter (Signed)
 Good afternoon Dr Myrtie Neither  Can you please advise on previous note.

## 2023-10-27 NOTE — Telephone Encounter (Signed)
 Good Morning Dr Katrinka Blazing   We received a call from patient daughter wanting to transfer care to Dr Myrtie Neither.  Reason for transfer is due to the daughter preference.   Please review and advise on scheduling.

## 2023-10-27 NOTE — Telephone Encounter (Signed)
 Sure

## 2023-10-27 NOTE — Telephone Encounter (Signed)
 While I do not know if I will have any more to offer than Dr. Lavon Paganini has, she can see me in the office.  H Danis

## 2023-12-19 ENCOUNTER — Ambulatory Visit: Admitting: Gastroenterology

## 2024-01-10 ENCOUNTER — Encounter: Payer: Self-pay | Admitting: Emergency Medicine

## 2024-01-10 ENCOUNTER — Ambulatory Visit: Admitting: Emergency Medicine

## 2024-01-10 VITALS — BP 126/70 | HR 78 | Temp 97.7°F | Ht 61.0 in | Wt 153.8 lb

## 2024-01-10 DIAGNOSIS — J849 Interstitial pulmonary disease, unspecified: Secondary | ICD-10-CM

## 2024-01-10 DIAGNOSIS — K219 Gastro-esophageal reflux disease without esophagitis: Secondary | ICD-10-CM | POA: Diagnosis not present

## 2024-01-10 DIAGNOSIS — R053 Chronic cough: Secondary | ICD-10-CM

## 2024-01-10 MED ORDER — FAMOTIDINE 20 MG PO TABS: 20.0000 mg | ORAL_TABLET | Freq: Two times a day (BID) | ORAL | 3 refills | Status: AC

## 2024-01-10 MED ORDER — LORATADINE 10 MG PO TABS: 10.0000 mg | ORAL_TABLET | Freq: Every day | ORAL | 3 refills | Status: AC

## 2024-01-10 NOTE — Assessment & Plan Note (Signed)
 Reviewed her CT scan of the abdomen from January.  No significant ILD, very subtle basilar groundglass without any interval change.  Do not need to schedule any dedicated follow-up

## 2024-01-10 NOTE — Assessment & Plan Note (Signed)
 She describes active GERD, active rhinitis.  Suspect both are making her cough more labile.  We will try treating with loratadine, Pepcid  to see if this helps.

## 2024-01-10 NOTE — Assessment & Plan Note (Signed)
 Starting Pepcid 

## 2024-01-10 NOTE — Progress Notes (Signed)
 Subjective:    Patient ID: Tina [name], female    DOB: [birth year], 89 y.o.   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  I last saw her in 2023.  She has some dependent groundglass changes and subtle reticulation on CT scan of the chest.  We did not plan any dedicated follow-up.  At the time her cough was doing fairly well. Today she reports that she still gets out to store, to hairdresser. She feels that her breathing is doing fairly well. She no longer has an inhaler. She sleeps poorly. She has tried mirtazapine but didn't like how it made her feel. She has had a lot of reflux especially at night  - doing a lot of TUMS. No longer on acid suppression. She is having more cough, still has a lot of congestion and drainage.   CT C/A/P 08/30/23 reviewed by me showed no significant ILD at the bases - no change from priors   Review of Systems As per HPi  Past Medical History:  Diagnosis Date   Canker sore    Cystocele    Depression    Diverticulosis    Esophageal dysmotility    Esophageal stricture    GERD (gastroesophageal reflux disease)    Heartburn    Hypertension    TIA (transient ischemic attack)      Family History  Problem Relation Age of Onset   Cancer Paternal Aunt    Cancer Paternal Uncle    Diabetes Father    Heart disease Father    Cancer Father    Diabetes Sister    Heart disease Sister    Leukemia Sister    Heart disease Brother     Family Hx lung CA  Social History   Socioeconomic History   Marital status: Married    Spouse name: Not on file   Number of children: Not on file   Years of education: Not on file   Highest education level: Not on file  Occupational History   Not on file  Tobacco Use   Smoking status: Former    Current packs/day: 0.00     Average packs/day: 0.5 packs/day for 15.0 years (7.5 ttl pk-yrs)    Types: Cigarettes    Start date: 08/01/1948    Quit date: 08/02/1963    Years since quitting: 60.4   Smokeless tobacco: Never  Vaping Use   Vaping status: Never Used  Substance and Sexual Activity   Alcohol  use: No   Drug use: No   Sexual activity: Not on file  Other Topics Concern   Not on file  Social History Narrative   Not on file   Social Drivers of Health   Financial Resource Strain: Low Risk  (10/05/2022)   Received from [facility]   Overall Financial Resource Strain (CARDIA)    Difficulty of Paying Living Expenses: Not hard at all  Food Insecurity: No Food Insecurity (10/05/2022)   Received from [facility]   Hunger Vital Sign    Worried About Running Out of Food in the Last Year: Never true    Ran Out of Food in the Last Year: Never true  Transportation Needs: No Transportation Needs (10/05/2022)   Received from [facility] - Transportation    Lack of Transportation (Medical): No    Lack  of Transportation (Non-Medical): No  Physical Activity: Not on file  Stress: Not on file  Social Connections: Unknown (11/28/2021)   Received from [facility], [facility]   Social Network    Social Network: Not on file  Intimate Partner Violence: Unknown (11/04/2021)   Received from [facility], [facility]   HITS    Physically Hurt: Not on file    Insult or Talk Down To: Not on file    Threaten Physical Harm: Not on file    Scream or Curse: Not on file     Allergies  Allergen Reactions   Valdecoxib Other (See Comments)    Reaction:  Unknown    Prednisone Rash     Outpatient Medications Prior to Visit  Medication Sig Dispense Refill   acetaminophen  (TYLENOL ) 500 MG tablet Take 500 mg by mouth every 6 (six) hours as needed for mild pain, moderate pain, fever or headache.     atorvastatin  (LIPITOR) 10 MG tablet Take 10 mg by mouth daily at 6 PM. ON HOLD 04/23/21     BIOTIN PO Take 1  tablet by mouth daily.     Calcium  Carbonate-Vitamin D 600-400 MG-UNIT tablet Take 1 tablet by mouth daily.     cholecalciferol (VITAMIN D) 1000 units tablet Take 1,000 Units by mouth every other day.     estradiol (ESTRACE) 0.1 MG/GM vaginal cream SMARTSIG:1 Vaginal 3 Times a Week     losartan  (COZAAR ) 25 MG tablet TAKE 1 TABLET BY MOUTH DAILY 30 tablet 0   Polyethyl Glycol-Propyl Glycol (SYSTANE OP) Place 1 drop into both eyes daily as needed (dry eyes/ allergies).     polyethylene glycol (MIRALAX  / GLYCOLAX ) packet Take 17 g by mouth daily as needed for mild constipation.     albuterol (VENTOLIN HFA) 108 (90 Base) MCG/ACT inhaler Inhale 2 puffs into the lungs every 6 (six) hours as needed for wheezing or shortness of breath. (Patient not taking: Reported on 01/10/2024)     amoxicillin (AMOXIL) 500 MG capsule  (Patient not taking: Reported on 01/10/2024)     mirtazapine (REMERON) 7.5 MG tablet Take 7.5 mg by mouth at bedtime. (Patient not taking: Reported on 01/10/2024)     nitroGLYCERIN  (NITROSTAT ) 0.4 MG SL tablet Place 1 tablet (0.4 mg total) under the tongue every 5 (five) minutes as needed for chest pain. (Patient not taking: Reported on 01/10/2024) 25 tablet 3   No facility-administered medications prior to visit.          Objective:   Physical Exam Vitals:   01/10/24 1528  BP: 126/70  Pulse: 78  Temp: 97.7 F (36.5 C)  TempSrc: Temporal  SpO2: 97%  Weight: 153 lb 12.8 oz (69.8 kg)  Height: 5' 1 (1.549 m)   Gen: Pleasant, well-nourished, in no distress,  normal affect  ENT: No lesions,  mouth clear,  oropharynx clear, no postnasal drip  Neck: No JVD, no stridor  Lungs: No use of accessory muscles, mostly clear, few basilar insp crackles.  Cardiovascular: RRR, heart sounds normal, no murmur or gallops, no peripheral edema  Musculoskeletal: No deformities, no cyanosis or clubbing  Neuro: alert, awake, non focal  Skin: Warm, no lesions or rash      Assessment &  Plan:  Chronic cough She describes active GERD, active rhinitis.  Suspect both are making her cough more labile.  We will try treating with loratadine, Pepcid  to see if this helps.  Gastro-esophageal reflux disease without esophagitis Starting Pepcid   ILD (interstitial lung  disease) [facility]) Reviewed her CT scan of the abdomen from January.  No significant ILD, very subtle basilar groundglass without any interval change.  Do not need to schedule any dedicated follow-up    Racheal Buddle, MD, PhD 01/10/2024, 3:57 PM [facility] [phone] or if no answer before 7:00PM call [phone] For any issues after 7:00PM please call eLink [phone]

## 2024-01-10 NOTE — Patient Instructions (Addendum)
 Please try starting famotidine  20 mg twice a day Start loratadine 10 mg once daily. Hopefully both of these medications will help some of the triggers that are flaring up your coughing. Follow in our office in 1 year, sooner if you have any problems.

## 2024-01-15 ENCOUNTER — Encounter: Payer: Self-pay | Admitting: Gastroenterology

## 2024-01-15 ENCOUNTER — Ambulatory Visit: Admitting: Gastroenterology

## 2024-01-15 VITALS — BP 138/60 | HR 72 | Ht 61.0 in | Wt 153.0 lb

## 2024-01-15 DIAGNOSIS — R1030 Lower abdominal pain, unspecified: Secondary | ICD-10-CM | POA: Diagnosis not present

## 2024-01-15 DIAGNOSIS — K5909 Other constipation: Secondary | ICD-10-CM

## 2024-01-15 DIAGNOSIS — M9905 Segmental and somatic dysfunction of pelvic region: Secondary | ICD-10-CM

## 2024-01-15 DIAGNOSIS — M6289 Other specified disorders of muscle: Secondary | ICD-10-CM

## 2024-01-15 DIAGNOSIS — R159 Full incontinence of feces: Secondary | ICD-10-CM

## 2024-01-15 NOTE — Progress Notes (Signed)
 [facility] Progress Note  Chief Complaint: Chronic constipation and fecal incontinence  Subjective  Prior history Patient of Dr. Leonia Raman, contacted our office requesting change of care/second opinion Dr. Dominic Friendly.  Last APP visit here June 2023 Chronic constipation from suspected pelvic floor dysfunction with overflow and fecal incontinence.  Fiber supplement reportedly made stool too firm, later advised to try glycerin suppository. Last colonoscopy by Dr. Grandville Lax September 2012-marked left-sided diverticulosis  Nonacute CTAP January 2025   History of Present Illness  Mell has ongoing bandlike lower abdominal pain that is there more days and not with associated feeling of bloating.  If she rubs on her lower abdomen she can feel knots or lumps in the abdominal wall. Bowel habits still irregular, and her treatment regimen is 1 fiber capsule every morning.  If she has not had a BM for 2 days she will take a dose of MiraLAX  but it does not always work well.  She has now not had a bowel movement for the last 3 or 4 days and with that feels more bloated and uncomfortable.  She also tangentially talks about some orthopedic issues with left hip pain radiating down the left leg. She denies rectal bleeding, her appetite is good and her weight is stable.  Ladonya says she came on the suggestion of her daughter who was concerned about the ongoing symptoms and felt her mother should be seen by a physician rather than an APP.  ROS: Cardiovascular:  no chest pain Respiratory: Intermittent dyspnea Arthralgias Remainder systems negative except as above The patient's Past Medical, Family and Social History were reviewed and are on file in the EMR. Past Medical History:  Diagnosis Date   Canker sore    Cystocele    Depression    Diverticulosis    Esophageal dysmotility    Esophageal stricture    GERD (gastroesophageal reflux disease)    Heartburn    Hypertension    TIA (transient ischemic  attack)     Past Surgical History:  Procedure Laterality Date   APPENDECTOMY     BRAVO PH STUDY N/A 01/01/2020   Procedure: BRAVO PH STUDY- 48hrs;  Surgeon: Sergio Dandy, MD;  Location: [facility];  Service: Endoscopy;  Laterality: N/A;   CATARACT EXTRACTION     COLON SURGERY     ESOPHAGOGASTRODUODENOSCOPY N/A 01/01/2020   Procedure: ESOPHAGOGASTRODUODENOSCOPY (EGD);  Surgeon: Nandigam, Kavitha V, MD;  Location: [facility];  Service: Endoscopy;  Laterality: N/A;   JOINT REPLACEMENT     PARTIAL COLECTOMY  09/1991   due to diverticulitis   SHOULDER SURGERY     right shoulder- removal calcium  deposit   TOTAL HIP ARTHROPLASTY  6/03   TOTAL HIP ARTHROPLASTY Left 02/24/2016   Procedure: LEFT TOTAL HIP ARTHROPLASTY ANTERIOR APPROACH;  Surgeon: Liliane Rei, MD;  Location: [facility];  Service: Orthopedics;  Laterality: Left;     Objective:  Med list reviewed  Current Outpatient Medications:    acetaminophen  (TYLENOL ) 500 MG tablet, Take 500 mg by mouth every 6 (six) hours as needed for mild pain, moderate pain, fever or headache., Disp: , Rfl:    atorvastatin  (LIPITOR) 10 MG tablet, Take 10 mg by mouth daily at 6 PM. ON HOLD 04/23/21, Disp: , Rfl:    BIOTIN PO, Take 1 tablet by mouth daily., Disp: , Rfl:    Calcium  Carbonate-Vitamin D 600-400 MG-UNIT tablet, Take 1 tablet by mouth daily., Disp: , Rfl:    cholecalciferol (VITAMIN D) 1000 units tablet, Take  1,000 Units by mouth every other day., Disp: , Rfl:    estradiol (ESTRACE) 0.1 MG/GM vaginal cream, SMARTSIG:1 Vaginal 3 Times a Week, Disp: , Rfl:    famotidine  (PEPCID ) 20 MG tablet, Take 1 tablet (20 mg total) by mouth 2 (two) times daily., Disp: 120 tablet, Rfl: 3   loratadine  (CLARITIN ) 10 MG tablet, Take 1 tablet (10 mg total) by mouth daily., Disp: 90 tablet, Rfl: 3   losartan  (COZAAR ) 25 MG tablet, TAKE 1 TABLET BY MOUTH DAILY, Disp: 30 tablet, Rfl: 0   Polyethyl Glycol-Propyl Glycol (SYSTANE OP), Place 1 drop into both eyes  daily as needed (dry eyes/ allergies)., Disp: , Rfl:    polyethylene glycol (MIRALAX  / GLYCOLAX ) packet, Take 17 g by mouth daily as needed for mild constipation., Disp: , Rfl:    Vital signs in last 24 hrs: Vitals:   01/15/24 1352  BP: 138/60  Pulse: 72   Wt Readings from Last 3 Encounters:  01/15/24 153 lb (69.4 kg)  01/10/24 153 lb 12.8 oz (69.8 kg)  08/15/23 140 lb (63.5 kg)    Physical Exam  Well-appearing.  Ambulates with a cane, gets on exam table with minimal assistance Cardiac: Regular,  no peripheral edema Pulm: clear to auscultation bilaterally, normal RR and effort noted Abdomen: soft, no tenderness, with active bowel sounds. No guarding or palpable hepatosplenomegaly.  Some small lipomas palpable deep in the mid lower abdominal wall Skin; warm and dry, no jaundice or rash   Labs:   ___________________________________________ Radiologic studies:   ____________________________________________ Other:   _____________________________________________   Encounter Diagnoses  Name Primary?   Lower abdominal pain Yes   Chronic constipation    Pelvic floor dysfunction    Full incontinence of feces     Assessment and Plan Assessment & Plan   Lower abdominal pain that I believe is due to constipation from pelvic floor dysfunction.  The PFD is also leading to incontinence from overflow, more so when she takes MiraLAX .  Reviewed her previous visits and test results. I would not advocate sigmoidoscopy or colonoscopy for her.  She had abdominal imaging earlier this year that was reassuring. Known sigmoid diverticulosis also contributing to constipation. I have done my best to explain the nature of this condition she has and how will be difficult for the same treatment every day to get consistent results.  Plan: Continue her 1 fiber capsule a day but at MiraLAX  half capful every morning and a glass of water and 1 glycerin suppository a day.  If she does not have  a BM for 2 days, take 1 capful of MiraLAX  twice a day for 2 days, then adjusting dose down after she has evacuation.  Eventually, she should plan to stay home on that particular day because results may be unpredictable and she has limited mobility.  Follow-up in office or call/portal message as needed   33 minutes were spent on this encounter, including in depth chart review, independent review of results as outlined above, communicating results with the patient directly, face-to-face time with the patient, coordinating care, ordering studies and medications as appropriate, and documentation.    Kerby Pearson III

## 2024-01-15 NOTE — Patient Instructions (Addendum)
 Start Miralax  1/2 capful daily in 8 ounces of liquid.  Glycerine suppository daily.  If you haven't had a bowel movement for 2 days increase Miralax  to 1 capful twice a day for 2 days, then go back to usual 1/2 capful daily dose.   _______________________________________________________  If your blood pressure at your visit was 140/90 or greater, please contact your primary care physician to follow up on this.  _______________________________________________________  If you are age 36 or older, your body mass index should be between 23-30. Your Body mass index is 28.91 kg/m. If this is out of the aforementioned range listed, please consider follow up with your Primary Care Provider.  If you are age 41 or younger, your body mass index should be between 19-25. Your Body mass index is 28.91 kg/m. If this is out of the aformentioned range listed, please consider follow up with your Primary Care Provider.   ________________________________________________________  The [facility] providers would like to encourage you to use MYCHART to communicate with providers for non-urgent requests or questions.  Due to long hold times on the telephone, sending your provider a message by [facility] may be a faster and more efficient way to get a response.  Please allow 48 business hours for a response.  Please remember that this is for non-urgent requests.  _______________________________________________________  Thank you for trusting me with your gastrointestinal care!    Dr. Kaleen [facility]

## 2024-03-13 IMAGING — CT CT MAXILLOFACIAL W/O CM
3 of 5 series · 14 of 47 positions shown, 16 images · non-contrast
Comparison: None.

CLINICAL DATA: Sinus pressure.  Right eye pain and drainage.



[Series 4: sinus 2.00 hr60 s3 cor bone · coronal · 0.23mm/px · 3 of 85 slices shown]
[im 29/85  bone]
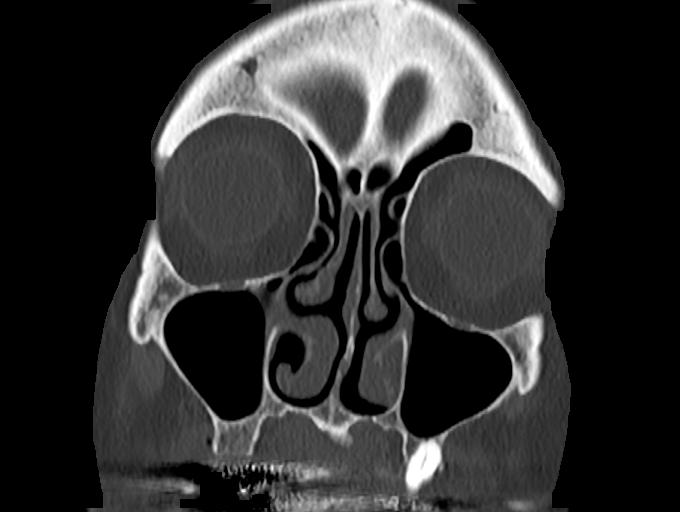
[im 38/85  bone]
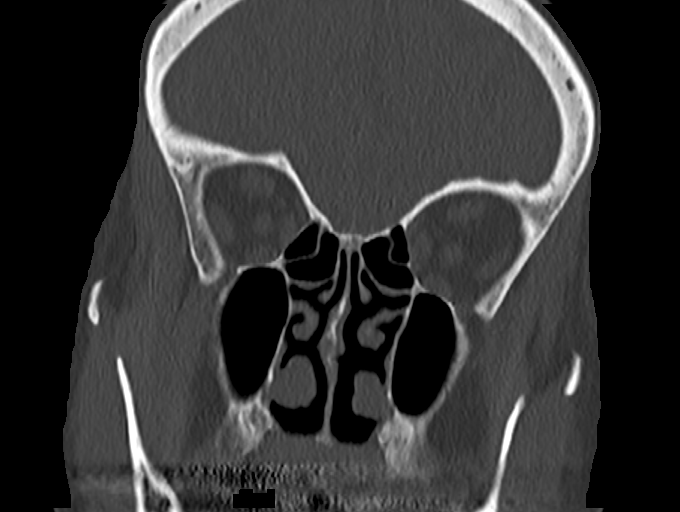
[im 47/85  bone]
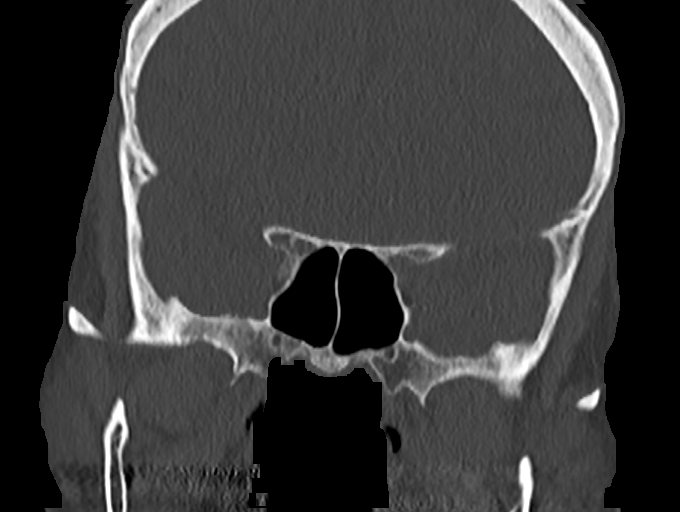

[Series 6: sinus 2.00 hr60 s3 sag bone · sagittal · 0.23mm/px · 3 of 78 slices shown]
[im 26/78  bone]
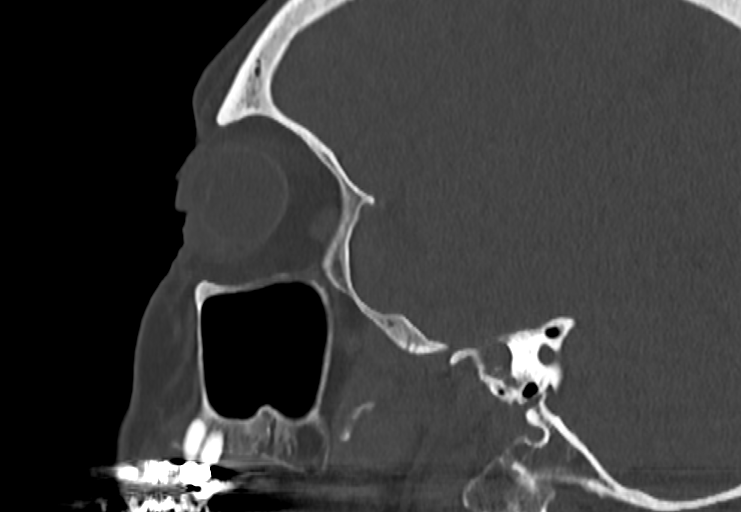
[im 39/78  bone]
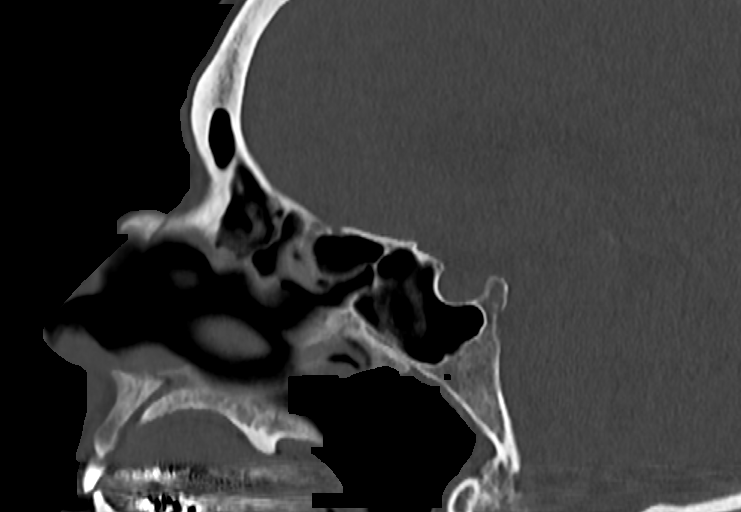
[im 52/78  bone]
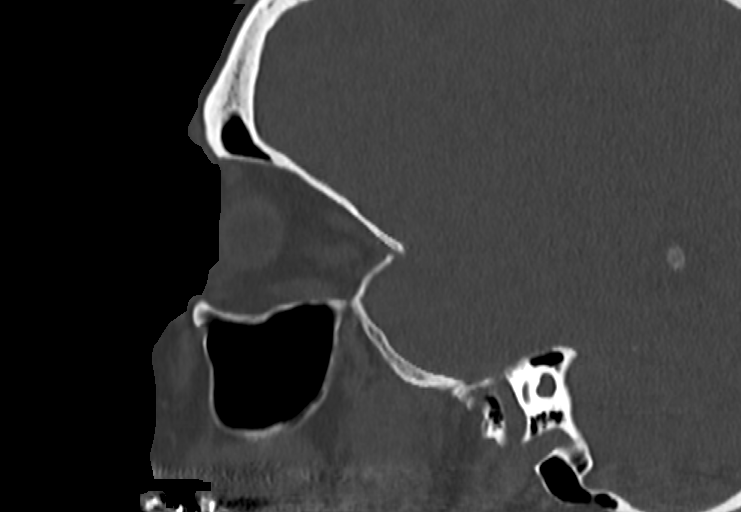

[Series 10: sinus 0.60 hr60 s3 axial fusion thins · axial · 0.34mm/px · z∈[-600,-509]mm · 8 of 196 slices shown, 10 images]
[im 22/196  brain]
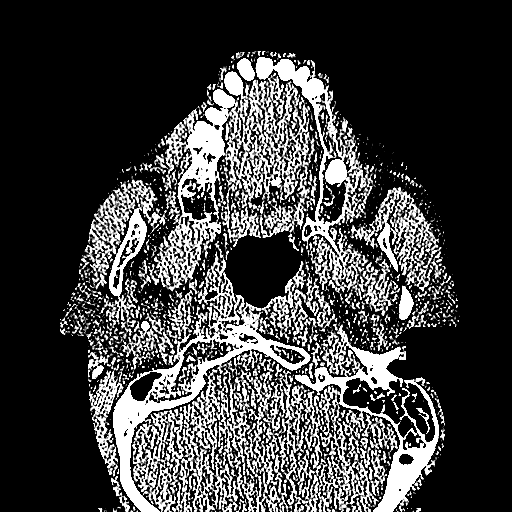
[im 22/196  bone]
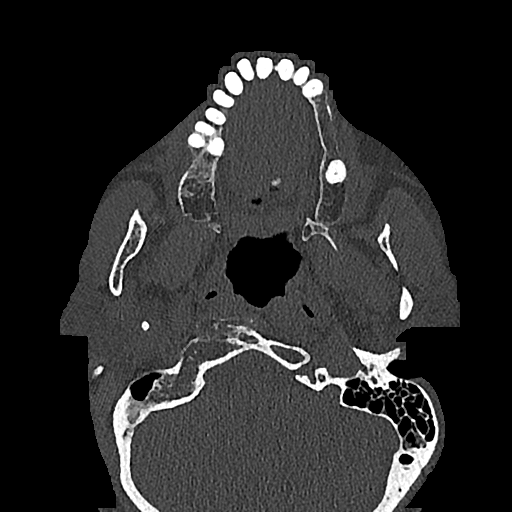
[im 44/196  bone]
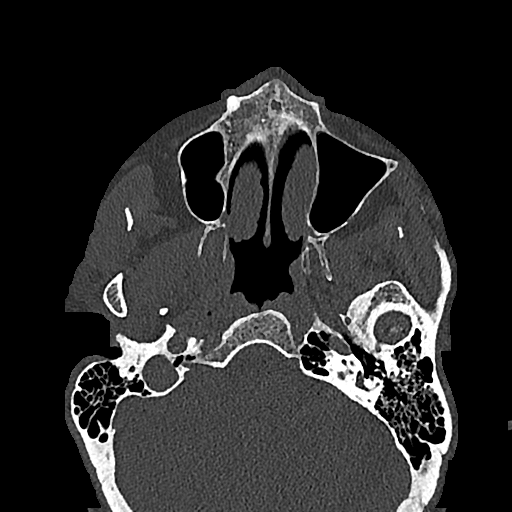
[im 66/196  bone]
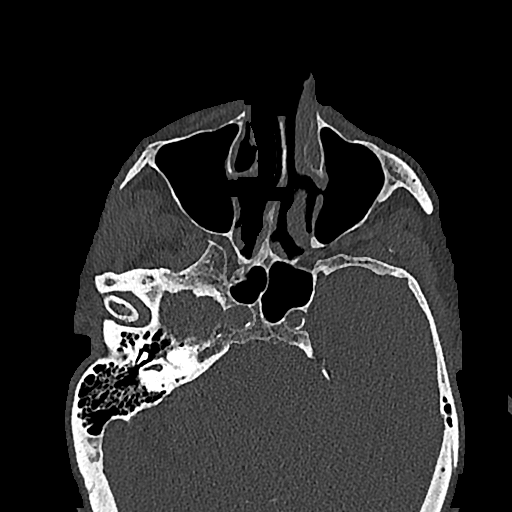
[im 87/196  bone]
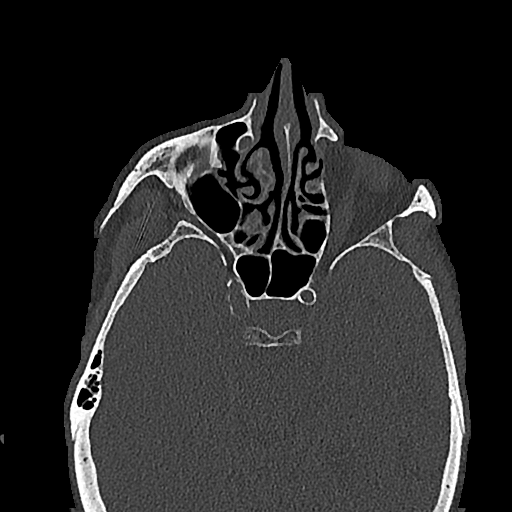
[im 109/196  brain]
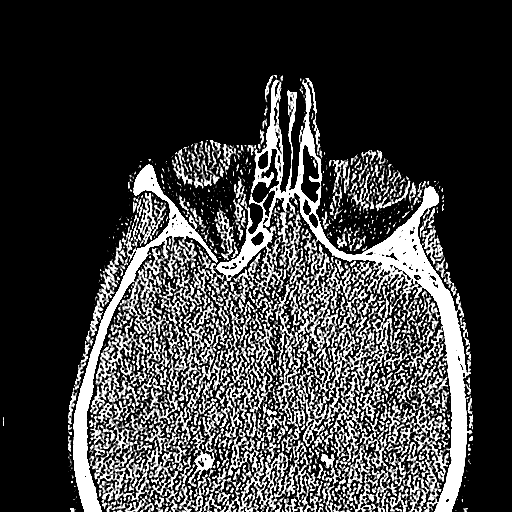
[im 109/196  bone]
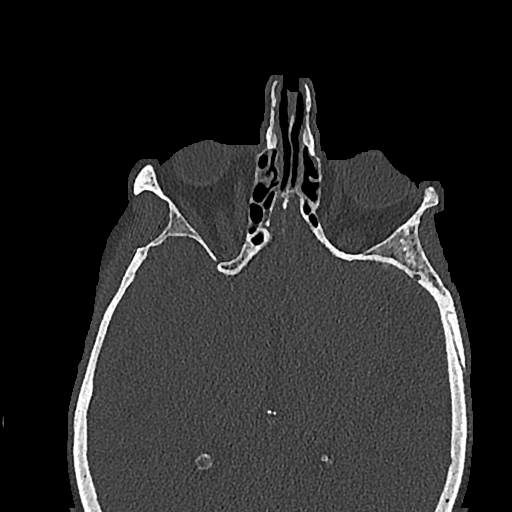
[im 131/196  bone]
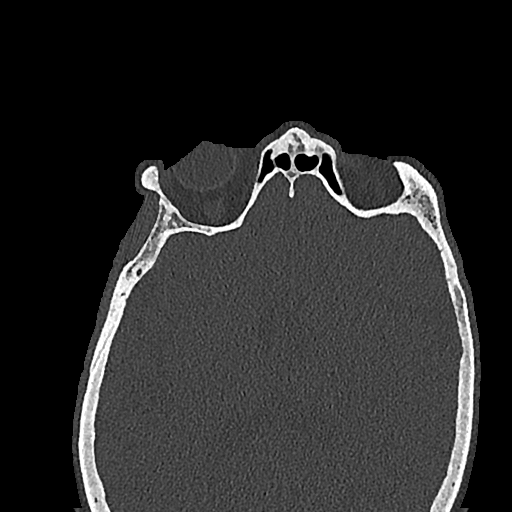
[im 152/196  bone]
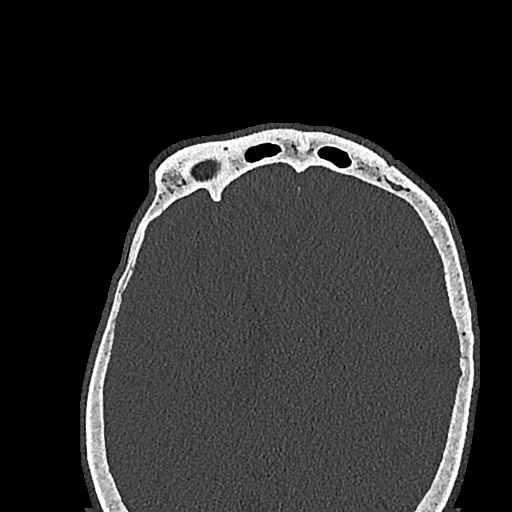
[im 174/196  bone]
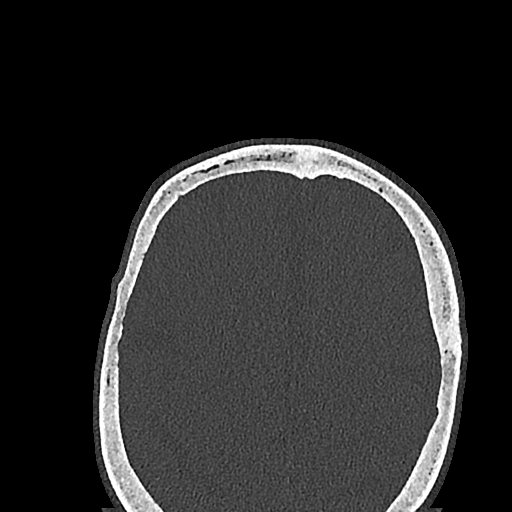

[14 of 47 positions shown; findings below may reference images not displayed]

FINDINGS: Osseous: No acute or focal osseous abnormality is present.

Orbits: Mild right periorbital soft tissue swelling is present. No
fluid collections are present. No postseptal inflammatory changes
are present. Bilateral lens replacements noted. Extraosseous muscles
and optic nerves are within normal limits bilaterally.

Sinuses: The paranasal sinuses and mastoid air cells are clear.

Soft tissues: Right periorbital soft tissue swelling is present
without focal fluid collection. Some thickening of the right eyelids
noted. Soft tissues are otherwise within normal limits.

Limited intracranial: Mild atrophy is stable. No acute abnormalities
are present.
IMPRESSION: 1. Right periorbital soft tissue swelling without focal fluid
collection or postseptal inflammatory changes.
2. The paranasal sinuses are otherwise within normal limits.

## 2024-04-08 NOTE — Therapy (Signed)
 OUTPATIENT PHYSICAL THERAPY THORACOLUMBAR EVALUATION   Patient Name: Tina [name] MRN: [id number] DOB:11-Jan-1931, 89 y.o., female Today's Date: 04/09/2024  END OF SESSION:  PT End of Session - 04/09/24 1308     Visit Number 1    Date for PT Re-Evaluation 07/09/24    Authorization Type [employer] medicare    PT Start Time 1309    PT Stop Time 1355    PT Time Calculation (min) 46 min    Activity Tolerance Patient tolerated treatment well    Behavior During Therapy WFL for tasks assessed/performed          Past Medical History:  Diagnosis Date   Canker sore    Cystocele    Depression    Diverticulosis    Esophageal dysmotility    Esophageal stricture    GERD (gastroesophageal reflux disease)    Heartburn    Hypertension    TIA (transient ischemic attack)    Past Surgical History:  Procedure Laterality Date   APPENDECTOMY     BRAVO PH STUDY N/A 01/01/2020   Procedure: BRAVO PH STUDY- 48hrs;  Surgeon: Shila Gustav GAILS, MD;  Location: [facility];  Service: Endoscopy;  Laterality: N/A;   CATARACT EXTRACTION     COLON SURGERY     ESOPHAGOGASTRODUODENOSCOPY N/A 01/01/2020   Procedure: ESOPHAGOGASTRODUODENOSCOPY (EGD);  Surgeon: Nandigam, Kavitha V, MD;  Location: [facility];  Service: Endoscopy;  Laterality: N/A;   JOINT REPLACEMENT     PARTIAL COLECTOMY  09/1991   due to diverticulitis   SHOULDER SURGERY     right shoulder- removal calcium  deposit   TOTAL HIP ARTHROPLASTY  6/03   TOTAL HIP ARTHROPLASTY Left 02/24/2016   Procedure: LEFT TOTAL HIP ARTHROPLASTY ANTERIOR APPROACH;  Surgeon: Dempsey Moan, MD;  Location: [facility];  Service: Orthopedics;  Laterality: Left;   Patient Active Problem List   Diagnosis Date Noted   Hyperlipidemia 04/18/2022   ILD (interstitial lung disease) (HCC) 12/22/2021   Chronic cough 12/22/2021   Chest pressure 10/16/2020   HTN (hypertension) 02/17/2020   Coronary artery calcification 11/20/2019   Angina pectoris (HCC) 11/20/2019   OA  (osteoarthritis) of hip 02/24/2016   Gastro-esophageal reflux disease without esophagitis 08/26/2013   Diverticulosis of colon 02/08/2012   Dysphagia 02/08/2012    PCP: Seabron, MD  REFERRING PROVIDER: DOROTHA Alexander, MD  REFERRING DIAG: Osteoporosis, back pain, weakness  Rationale for Evaluation and Treatment: Rehabilitation  THERAPY DIAG:  Muscle weakness (generalized)  Difficulty in walking, not elsewhere classified  ONSET DATE: 03/11/24  SUBJECTIVE:  SUBJECTIVE STATEMENT: Patient reports that she has been having more and more problems getting around, she reports numerous falls, but reports just one fall in the past 6 months.  She reports that she is more and more stooped over and feels weak, she does report pain in the back and the hips and pelvis and legs, reports that seems to be after starting the Prolia  PERTINENT HISTORY:  Depression, HTN, TIA, shoulder scope, THA x 2  PAIN:  Are you having pain? Yes: NPRS scale: 3-4/10 Pain location: back, hips and thighs Pain description: dull ache Aggravating factors: worse at night Psin up to 7/10 Relieving factors: better in the day pain can be a 3/10 Tylenol , Advil  PRECAUTIONS: None  RED FLAGS: None   WEIGHT BEARING RESTRICTIONS: No  FALLS:  Has patient fallen in last 6 months? Yes. Number of falls 1  LIVING ENVIRONMENT: Lives with: lives alone Lives in: House/apartment Stairs: No Has following equipment at home: Single point cane, Tour manager, and Grab bars  OCCUPATION: retired  PLOF: Independent with community mobility without device, drives, shops, light yardwork, cooks and cleans  PATIENT GOALS: do more, keep balance, keep going, feel stronger  NEXT MD VISIT: unsure  OBJECTIVE:  Note: Objective measures were completed at  Evaluation unless otherwise noted.  DIAGNOSTIC FINDINGS:  none  COGNITION: Overall cognitive status: Within functional limits for tasks assessed     SENSATION: WFL  MUSCLE LENGTH: Mild tightness in the calves, HS and piriformis  POSTURE: rounded shoulders, forward head, and decreased lumbar lordosis  PALPATION: Denies tenderness, just c/o deep aches  LUMBAR ROM:   AROM eval  Flexion Decreased 50%  Extension Decreased 100%  Right lateral flexion   Left lateral flexion   Right rotation   Left rotation    (Blank rows = not tested)  LOWER EXTREMITY MMT:    MMT Right eval Left eval  Hip flexion 3+ 3+  Hip extension    Hip abduction 3+ 3_+  Hip adduction    Hip internal rotation    Hip external rotation    Knee flexion 3+ 3+  Knee extension 4- 4-  Ankle dorsiflexion 4- 4-  Ankle plantarflexion    Ankle inversion    Ankle eversion     (Blank rows = not tested)  FUNCTIONAL TESTS:  TUG = 42 seconds with SPC 3 MWT= 200 feet Getting in and out of car:  She has to lift the right leg with her hands, reports difficulty a 7/10  GAIT: Distance walked: 200 feet  Assistive device utilized: Single point cane Level of assistance: Modified independence Comments: very slow, very unsure of herself, tends to use walls and furniture to help with balance  TREATMENT DATE:  04/09/24 Evaluation                                                                                                                                 PATIENT EDUCATION:  Education details: Evaluation, POC, HEP Person educated: Patient Education method: Medical illustrator Education comprehension: verbalized understanding  HOME EXERCISE PROGRAM: Standing at sink, marches, hip abduction, HS curls and small mini squats  ASSESSMENT:  CLINICAL IMPRESSION: Patient is a 89 y.o. female who was seen today for physical therapy evaluation and treatment for weakness, loss of balance and difficulty  walking.  She reports multiple falls in the past year but only one in the past 6 months, she reports that she is really having difficulty getting in and out of the car, has to lift the leg with her hands, she reports that she has OP and the MD wants her to exercise   OBJECTIVE IMPAIRMENTS: Abnormal gait, cardiopulmonary status limiting activity, decreased activity tolerance, decreased balance, decreased endurance, decreased mobility, difficulty walking, decreased ROM, decreased strength, impaired perceived functional ability, increased muscle spasms, impaired flexibility, improper body mechanics, postural dysfunction, and pain.   REHAB POTENTIAL: Good  CLINICAL DECISION MAKING: Stable/uncomplicated  EVALUATION COMPLEXITY: Low   GOALS: Goals reviewed with patient? Yes  SHORT TERM GOALS: Target date: 05/01/24  Independent with initial HEP Baseline: Goal status: INITIAL   LONG TERM GOALS: Target date: 07/09/24  Independent with advanced HEP Baseline:  Goal status: INITIAL  2.  Decrease TUG time to 25 seconds Baseline: 42 seconds Goal status: INITIAL  3.  Improve 3 minute walk test to 400 feet Baseline:  Goal status: INITIAL  4.  Report 50% easier to get in and out of car Baseline: reports difficulty an 8/10 Goal status: INITIAL  5.  Increase lumbar ROM 25% Baseline:  Goal status: INITIAL  PLAN:  PT FREQUENCY: 1-2x/week  PT DURATION: 12 weeks  PLANNED INTERVENTIONS: 97164- PT Re-evaluation, 97110-Therapeutic exercises, 97530- Therapeutic activity, 97112- Neuromuscular re-education, 97535- Self Care, [id number]- Manual therapy, [phone]- Gait training, [phone]- Electrical stimulation (unattended), Patient/Family education, Balance training, Stair training, Cryotherapy, and Moist heat.  PLAN FOR NEXT SESSION: Gym activities, balance, strength   Shya Kovatch W, PT 04/09/2024, 1:09 PM

## 2024-04-09 ENCOUNTER — Other Ambulatory Visit: Payer: Self-pay

## 2024-04-09 ENCOUNTER — Ambulatory Visit: Attending: Family Medicine | Admitting: Physical Therapy

## 2024-04-09 ENCOUNTER — Encounter: Payer: Self-pay | Admitting: Physical Therapy

## 2024-04-09 DIAGNOSIS — M6281 Muscle weakness (generalized): Secondary | ICD-10-CM | POA: Diagnosis present

## 2024-04-09 DIAGNOSIS — R262 Difficulty in walking, not elsewhere classified: Secondary | ICD-10-CM | POA: Diagnosis present

## 2024-04-24 ENCOUNTER — Encounter: Payer: Self-pay | Admitting: Physical Therapy

## 2024-04-24 ENCOUNTER — Ambulatory Visit: Admitting: Physical Therapy

## 2024-04-24 DIAGNOSIS — R262 Difficulty in walking, not elsewhere classified: Secondary | ICD-10-CM

## 2024-04-24 DIAGNOSIS — M6281 Muscle weakness (generalized): Secondary | ICD-10-CM | POA: Diagnosis not present

## 2024-04-24 NOTE — Therapy (Signed)
 OUTPATIENT PHYSICAL THERAPY THORACOLUMBAR TREATMENT   Patient Name: Tina [name] MRN: [id number] DOB:1930/10/13, 89 y.o., female Today's Date: 04/24/2024  END OF SESSION:  PT End of Session - 04/24/24 1307     Visit Number 2    Date for Recertification  07/09/24    Authorization Type [employer] medicare    PT Start Time 1308    PT Stop Time 1353    PT Time Calculation (min) 45 min    Activity Tolerance Patient tolerated treatment well    Behavior During Therapy WFL for tasks assessed/performed          Past Medical History:  Diagnosis Date   Canker sore    Cystocele    Depression    Diverticulosis    Esophageal dysmotility    Esophageal stricture    GERD (gastroesophageal reflux disease)    Heartburn    Hypertension    TIA (transient ischemic attack)    Past Surgical History:  Procedure Laterality Date   APPENDECTOMY     BRAVO PH STUDY N/A 01/01/2020   Procedure: BRAVO PH STUDY- 48hrs;  Surgeon: Shila Gustav GAILS, MD;  Location: [facility];  Service: Endoscopy;  Laterality: N/A;   CATARACT EXTRACTION     COLON SURGERY     ESOPHAGOGASTRODUODENOSCOPY N/A 01/01/2020   Procedure: ESOPHAGOGASTRODUODENOSCOPY (EGD);  Surgeon: Nandigam, Kavitha V, MD;  Location: [facility];  Service: Endoscopy;  Laterality: N/A;   JOINT REPLACEMENT     PARTIAL COLECTOMY  09/1991   due to diverticulitis   SHOULDER SURGERY     right shoulder- removal calcium  deposit   TOTAL HIP ARTHROPLASTY  6/03   TOTAL HIP ARTHROPLASTY Left 02/24/2016   Procedure: LEFT TOTAL HIP ARTHROPLASTY ANTERIOR APPROACH;  Surgeon: Dempsey Moan, MD;  Location: [facility];  Service: Orthopedics;  Laterality: Left;   Patient Active Problem List   Diagnosis Date Noted   Hyperlipidemia 04/18/2022   ILD (interstitial lung disease) (HCC) 12/22/2021   Chronic cough 12/22/2021   Chest pressure 10/16/2020   HTN (hypertension) 02/17/2020   Coronary artery calcification 11/20/2019   Angina pectoris 11/20/2019   OA  (osteoarthritis) of hip 02/24/2016   Gastro-esophageal reflux disease without esophagitis 08/26/2013   Diverticulosis of colon 02/08/2012   Dysphagia 02/08/2012    PCP: Seabron, MD  REFERRING PROVIDER: DOROTHA Alexander, MD  REFERRING DIAG: Osteoporosis, back pain, weakness  Rationale for Evaluation and Treatment: Rehabilitation  THERAPY DIAG:  Muscle weakness (generalized)  Difficulty in walking, not elsewhere classified  ONSET DATE: 03/11/24  SUBJECTIVE:  SUBJECTIVE STATEMENT: 04/24/24  Patient reports that she is tired, just really move slow and takes a long time to do nothing.  04/09/24 Patient reports that she has been having more and more problems getting around, she reports numerous falls, but reports just one fall in the past 6 months.  She reports that she is more and more stooped over and feels weak, she does report pain in the back and the hips and pelvis and legs, reports that seems to be after starting the Prolia  PERTINENT HISTORY:  Depression, HTN, TIA, shoulder scope, THA x 2  PAIN:  Are you having pain? Yes: NPRS scale: 3-4/10 Pain location: back, hips and thighs Pain description: dull ache Aggravating factors: worse at night Psin up to 7/10 Relieving factors: better in the day pain can be a 3/10 Tylenol , Advil  PRECAUTIONS: None  RED FLAGS: None   WEIGHT BEARING RESTRICTIONS: No  FALLS:  Has patient fallen in last 6 months? Yes. Number of falls 1  LIVING ENVIRONMENT: Lives with: lives alone Lives in: House/apartment Stairs: No Has following equipment at home: Single point cane, Tour manager, and Grab bars  OCCUPATION: retired  PLOF: Independent with community mobility without device, drives, shops, light yardwork, cooks and cleans  PATIENT GOALS: do more, keep balance, keep  going, feel stronger  NEXT MD VISIT: unsure  OBJECTIVE:  Note: Objective measures were completed at Evaluation unless otherwise noted.  DIAGNOSTIC FINDINGS:  none  COGNITION: Overall cognitive status: Within functional limits for tasks assessed     SENSATION: WFL  MUSCLE LENGTH: Mild tightness in the calves, HS and piriformis  POSTURE: rounded shoulders, forward head, and decreased lumbar lordosis  PALPATION: Denies tenderness, just c/o deep aches  LUMBAR ROM:   AROM eval  Flexion Decreased 50%  Extension Decreased 100%  Right lateral flexion   Left lateral flexion   Right rotation   Left rotation    (Blank rows = not tested)  LOWER EXTREMITY MMT:    MMT Right eval Left eval  Hip flexion 3+ 3+  Hip extension    Hip abduction 3+ 3_+  Hip adduction    Hip internal rotation    Hip external rotation    Knee flexion 3+ 3+  Knee extension 4- 4-  Ankle dorsiflexion 4- 4-  Ankle plantarflexion    Ankle inversion    Ankle eversion     (Blank rows = not tested)  FUNCTIONAL TESTS:  TUG = 42 seconds with SPC 3 MWT= 200 feet Getting in and out of car:  She has to lift the right leg with her hands, reports difficulty a 7/10  GAIT: Distance walked: 200 feet  Assistive device utilized: Single point cane Level of assistance: Modified independence Comments: very slow, very unsure of herself, tends to use walls and furniture to help with balance  TREATMENT DATE:  04/24/24 1.5# LAQ 2x10 1.5# marches 2x10 Red tband HS curls 2x10 Ball b/n knees squeeze 2x10 Red tband clamshells 2x10 Toe and heel raises in sitting Nustep level 5 x 5 minutes Gait outside out the back door with [facility] and HHA through the grass negotiated curb to her car  04/09/24 Evaluation  PATIENT EDUCATION:  Education details: Evaluation, POC, HEP Person educated:  Patient Education method: Medical illustrator Education comprehension: verbalized understanding  HOME EXERCISE PROGRAM: Standing at sink, marches, hip abduction, HS curls and small mini squats  ASSESSMENT:  CLINICAL IMPRESSION: Initiated exercises in the gym, she is very slow and needs cues to stay on task, she tolerated the exercises but is very fatigued with exercises and activities  Patient is a 89 y.o. female who was seen today for physical therapy evaluation and treatment for weakness, loss of balance and difficulty walking.  She reports multiple falls in the past year but only one in the past 6 months, she reports that she is really having difficulty getting in and out of the car, has to lift the leg with her hands, she reports that she has OP and the MD wants her to exercise   OBJECTIVE IMPAIRMENTS: Abnormal gait, cardiopulmonary status limiting activity, decreased activity tolerance, decreased balance, decreased endurance, decreased mobility, difficulty walking, decreased ROM, decreased strength, impaired perceived functional ability, increased muscle spasms, impaired flexibility, improper body mechanics, postural dysfunction, and pain.   REHAB POTENTIAL: Good  CLINICAL DECISION MAKING: Stable/uncomplicated  EVALUATION COMPLEXITY: Low   GOALS: Goals reviewed with patient? Yes  SHORT TERM GOALS: Target date: 05/01/24  Independent with initial HEP Baseline: Goal status: ongoing 04/24/24   LONG TERM GOALS: Target date: 07/09/24  Independent with advanced HEP Baseline:  Goal status: INITIAL  2.  Decrease TUG time to 25 seconds Baseline: 42 seconds Goal status: INITIAL  3.  Improve 3 minute walk test to 400 feet Baseline:  Goal status: INITIAL  4.  Report 50% easier to get in and out of car Baseline: reports difficulty an 8/10 Goal status: INITIAL  5.  Increase lumbar ROM 25% Baseline:  Goal status: INITIAL  PLAN:  PT FREQUENCY: 1-2x/week  PT  DURATION: 12 weeks  PLANNED INTERVENTIONS: 97164- PT Re-evaluation, 97110-Therapeutic exercises, 97530- Therapeutic activity, 97112- Neuromuscular re-education, 97535- Self Care, 02859- Manual therapy, [phone]- Gait training, [phone]- Electrical stimulation (unattended), Patient/Family education, Balance training, Stair training, Cryotherapy, and Moist heat.  PLAN FOR NEXT SESSION: Gym activities, balance, strength   Lucille Witts W, PT 04/24/2024, 1:08 PM

## 2024-04-30 ENCOUNTER — Ambulatory Visit: Admitting: Physical Therapy

## 2024-04-30 ENCOUNTER — Encounter: Payer: Self-pay | Admitting: Physical Therapy

## 2024-04-30 DIAGNOSIS — M6281 Muscle weakness (generalized): Secondary | ICD-10-CM

## 2024-04-30 DIAGNOSIS — R262 Difficulty in walking, not elsewhere classified: Secondary | ICD-10-CM

## 2024-04-30 NOTE — Therapy (Signed)
 OUTPATIENT PHYSICAL THERAPY THORACOLUMBAR TREATMENT   Patient Name: Tina [name] MRN: [id number] DOB:1931/05/23, 89 y.o., female Today's Date: 04/30/2024  END OF SESSION:  PT End of Session - 04/30/24 1301     Visit Number 3    Date for Recertification  07/09/24    PT Start Time 1300    PT Stop Time 1345    PT Time Calculation (min) 45 min    Activity Tolerance Patient tolerated treatment well    Behavior During Therapy WFL for tasks assessed/performed          Past Medical History:  Diagnosis Date   Canker sore    Cystocele    Depression    Diverticulosis    Esophageal dysmotility    Esophageal stricture    GERD (gastroesophageal reflux disease)    Heartburn    Hypertension    TIA (transient ischemic attack)    Past Surgical History:  Procedure Laterality Date   APPENDECTOMY     BRAVO PH STUDY N/A 01/01/2020   Procedure: BRAVO PH STUDY- 48hrs;  Surgeon: Shila Gustav GAILS, MD;  Location: [facility];  Service: Endoscopy;  Laterality: N/A;   CATARACT EXTRACTION     COLON SURGERY     ESOPHAGOGASTRODUODENOSCOPY N/A 01/01/2020   Procedure: ESOPHAGOGASTRODUODENOSCOPY (EGD);  Surgeon: Nandigam, Kavitha V, MD;  Location: [facility];  Service: Endoscopy;  Laterality: N/A;   JOINT REPLACEMENT     PARTIAL COLECTOMY  09/1991   due to diverticulitis   SHOULDER SURGERY     right shoulder- removal calcium  deposit   TOTAL HIP ARTHROPLASTY  6/03   TOTAL HIP ARTHROPLASTY Left 02/24/2016   Procedure: LEFT TOTAL HIP ARTHROPLASTY ANTERIOR APPROACH;  Surgeon: Dempsey Moan, MD;  Location: [facility];  Service: Orthopedics;  Laterality: Left;   Patient Active Problem List   Diagnosis Date Noted   Hyperlipidemia 04/18/2022   ILD (interstitial lung disease) (HCC) 12/22/2021   Chronic cough 12/22/2021   Chest pressure 10/16/2020   HTN (hypertension) 02/17/2020   Coronary artery calcification 11/20/2019   Angina pectoris 11/20/2019   OA (osteoarthritis) of hip 02/24/2016    Gastro-esophageal reflux disease without esophagitis 08/26/2013   Diverticulosis of colon 02/08/2012   Dysphagia 02/08/2012    PCP: Seabron, MD  REFERRING PROVIDER: DOROTHA Alexander, MD  REFERRING DIAG: Osteoporosis, back pain, weakness  Rationale for Evaluation and Treatment: Rehabilitation  THERAPY DIAG:  Difficulty in walking, not elsewhere classified  Muscle weakness (generalized)  ONSET DATE: 03/11/24  SUBJECTIVE:  SUBJECTIVE STATEMENT: 04/24/24  Doing ok, tired  04/09/24 Patient reports that she has been having more and more problems getting around, she reports numerous falls, but reports just one fall in the past 6 months.  She reports that she is more and more stooped over and feels weak, she does report pain in the back and the hips and pelvis and legs, reports that seems to be after starting the Prolia  PERTINENT HISTORY:  Depression, HTN, TIA, shoulder scope, THA x 2  PAIN:  Are you having pain? Yes: NPRS scale: 5/10 Pain location: back, hips and thighs Pain description: dull ache Aggravating factors: worse at night Psin up to 7/10 Relieving factors: better in the day pain can be a 3/10 Tylenol , Advil  PRECAUTIONS: None  RED FLAGS: None   WEIGHT BEARING RESTRICTIONS: No  FALLS:  Has patient fallen in last 6 months? Yes. Number of falls 1  LIVING ENVIRONMENT: Lives with: lives alone Lives in: House/apartment Stairs: No Has following equipment at home: Single point cane, Tour manager, and Grab bars  OCCUPATION: retired  PLOF: Independent with community mobility without device, drives, shops, light yardwork, cooks and cleans  PATIENT GOALS: do more, keep balance, keep going, feel stronger  NEXT MD VISIT: unsure  OBJECTIVE:  Note: Objective measures were completed at Evaluation  unless otherwise noted.  DIAGNOSTIC FINDINGS:  none  COGNITION: Overall cognitive status: Within functional limits for tasks assessed     SENSATION: WFL  MUSCLE LENGTH: Mild tightness in the calves, HS and piriformis  POSTURE: rounded shoulders, forward head, and decreased lumbar lordosis  PALPATION: Denies tenderness, just c/o deep aches  LUMBAR ROM:   AROM eval  Flexion Decreased 50%  Extension Decreased 100%  Right lateral flexion   Left lateral flexion   Right rotation   Left rotation    (Blank rows = not tested)  LOWER EXTREMITY MMT:    MMT Right eval Left eval  Hip flexion 3+ 3+  Hip extension    Hip abduction 3+ 3_+  Hip adduction    Hip internal rotation    Hip external rotation    Knee flexion 3+ 3+  Knee extension 4- 4-  Ankle dorsiflexion 4- 4-  Ankle plantarflexion    Ankle inversion    Ankle eversion     (Blank rows = not tested)  FUNCTIONAL TESTS:  TUG = 42 seconds with SPC 3 MWT= 200 feet Getting in and out of car:  She has to lift the right leg with her hands, reports difficulty a 7/10  GAIT: Distance walked: 200 feet  Assistive device utilized: Single point cane Level of assistance: Modified independence Comments: very slow, very unsure of herself, tends to use walls and furniture to help with balance  TREATMENT DATE:  04/30/24 Nustep level  x 6 minutes Sit to stand 2x5  Ball b/n knees squeeze 2x10 Red tband HS curls 2x10 2lb LAQ 2x10 2lb marches 2x10 Shoulder ext red 2x10  04/24/24 1.5# LAQ 2x10 1.5# marches 2x10 Red tband HS curls 2x10 Ball b/n knees squeeze 2x10 Red tband clamshells 2x10 Toe and heel raises in sitting Nustep level 5 x 5 minutes Gait outside out the back door with SPC and HHA through the grass negotiated curb to her car  04/09/24 Evaluation  PATIENT EDUCATION:  Education  details: Evaluation, POC, HEP Person educated: Patient Education method: Medical illustrator Education comprehension: verbalized understanding  HOME EXERCISE PROGRAM: Standing at sink, marches, hip abduction, HS curls and small mini squats  ASSESSMENT:  CLINICAL IMPRESSION: Continued exercises in the gym, she is very slow and needs cues to stay on task, she tolerated the exercises but is very fatigued with exercises and activities. Postural cue needed with shoulder Ext. Cues not to allow LE to push against table with sit to stands.  Patient is a 89 y.o. female who was seen today for physical therapy evaluation and treatment for weakness, loss of balance and difficulty walking.  She reports multiple falls in the past year but only one in the past 6 months, she reports that she is really having difficulty getting in and out of the car, has to lift the leg with her hands, she reports that she has OP and the MD wants her to exercise   OBJECTIVE IMPAIRMENTS: Abnormal gait, cardiopulmonary status limiting activity, decreased activity tolerance, decreased balance, decreased endurance, decreased mobility, difficulty walking, decreased ROM, decreased strength, impaired perceived functional ability, increased muscle spasms, impaired flexibility, improper body mechanics, postural dysfunction, and pain.   REHAB POTENTIAL: Good  CLINICAL DECISION MAKING: Stable/uncomplicated  EVALUATION COMPLEXITY: Low   GOALS: Goals reviewed with patient? Yes  SHORT TERM GOALS: Target date: 05/01/24  Independent with initial HEP Baseline: Goal status: ongoing 04/24/24   LONG TERM GOALS: Target date: 07/09/24  Independent with advanced HEP Baseline:  Goal status: INITIAL  2.  Decrease TUG time to 25 seconds Baseline: 42 seconds Goal status: INITIAL  3.  Improve 3 minute walk test to 400 feet Baseline:  Goal status: INITIAL  4.  Report 50% easier to get in and out of car Baseline: reports  difficulty an 8/10 Goal status: INITIAL  5.  Increase lumbar ROM 25% Baseline:  Goal status: INITIAL  PLAN:  PT FREQUENCY: 1-2x/week  PT DURATION: 12 weeks  PLANNED INTERVENTIONS: 97164- PT Re-evaluation, 97110-Therapeutic exercises, 97530- Therapeutic activity, 97112- Neuromuscular re-education, 97535- Self Care, 02859- Manual therapy, [phone]- Gait training, [phone]- Electrical stimulation (unattended), Patient/Family education, Balance training, Stair training, Cryotherapy, and Moist heat.  PLAN FOR NEXT SESSION: Gym activities, balance, strength   Tanda KANDICE Sorrow, PTA 04/30/2024, 1:07 PM

## 2024-05-07 ENCOUNTER — Encounter: Payer: Self-pay | Admitting: Physical Therapy

## 2024-05-07 ENCOUNTER — Ambulatory Visit: Attending: Family Medicine | Admitting: Physical Therapy

## 2024-05-07 DIAGNOSIS — R262 Difficulty in walking, not elsewhere classified: Secondary | ICD-10-CM | POA: Insufficient documentation

## 2024-05-07 DIAGNOSIS — M6281 Muscle weakness (generalized): Secondary | ICD-10-CM | POA: Insufficient documentation

## 2024-05-07 NOTE — Therapy (Signed)
 OUTPATIENT PHYSICAL THERAPY THORACOLUMBAR TREATMENT   Patient Name: Tina [name] MRN: [id number] DOB:October 12, 1930, 89 y.o., female Today's Date: 05/07/2024  END OF SESSION:  PT End of Session - 05/07/24 1440     Visit Number 4    Date for Recertification  07/09/24    Authorization Type [employer] medicare    PT Start Time 1440    PT Stop Time 1525    PT Time Calculation (min) 45 min    Activity Tolerance Patient tolerated treatment well    Behavior During Therapy WFL for tasks assessed/performed          Past Medical History:  Diagnosis Date   Canker sore    Cystocele    Depression    Diverticulosis    Esophageal dysmotility    Esophageal stricture    GERD (gastroesophageal reflux disease)    Heartburn    Hypertension    TIA (transient ischemic attack)    Past Surgical History:  Procedure Laterality Date   APPENDECTOMY     BRAVO PH STUDY N/A 01/01/2020   Procedure: BRAVO PH STUDY- 48hrs;  Surgeon: Shila Gustav GAILS, MD;  Location: [facility];  Service: Endoscopy;  Laterality: N/A;   CATARACT EXTRACTION     COLON SURGERY     ESOPHAGOGASTRODUODENOSCOPY N/A 01/01/2020   Procedure: ESOPHAGOGASTRODUODENOSCOPY (EGD);  Surgeon: Nandigam, Kavitha V, MD;  Location: [facility];  Service: Endoscopy;  Laterality: N/A;   JOINT REPLACEMENT     PARTIAL COLECTOMY  09/1991   due to diverticulitis   SHOULDER SURGERY     right shoulder- removal calcium  deposit   TOTAL HIP ARTHROPLASTY  6/03   TOTAL HIP ARTHROPLASTY Left 02/24/2016   Procedure: LEFT TOTAL HIP ARTHROPLASTY ANTERIOR APPROACH;  Surgeon: Dempsey Moan, MD;  Location: [facility];  Service: Orthopedics;  Laterality: Left;   Patient Active Problem List   Diagnosis Date Noted   Hyperlipidemia 04/18/2022   ILD (interstitial lung disease) (HCC) 12/22/2021   Chronic cough 12/22/2021   Chest pressure 10/16/2020   HTN (hypertension) 02/17/2020   Coronary artery calcification 11/20/2019   Angina pectoris 11/20/2019   OA  (osteoarthritis) of hip 02/24/2016   Gastro-esophageal reflux disease without esophagitis 08/26/2013   Diverticulosis of colon 02/08/2012   Dysphagia 02/08/2012    PCP: Seabron, MD  REFERRING PROVIDER: DOROTHA Alexander, MD  REFERRING DIAG: Osteoporosis, back pain, weakness  Rationale for Evaluation and Treatment: Rehabilitation  THERAPY DIAG:  Difficulty in walking, not elsewhere classified  Muscle weakness (generalized)  ONSET DATE: 03/11/24  SUBJECTIVE:  SUBJECTIVE STATEMENT: 05/07/24  REports that she is tired and weak, reports that at night she has aches and pains in the hips and thighs, cramps in the calves  04/09/24 Patient reports that she has been having more and more problems getting around, she reports numerous falls, but reports just one fall in the past 6 months.  She reports that she is more and more stooped over and feels weak, she does report pain in the back and the hips and pelvis and legs, reports that seems to be after starting the Prolia  PERTINENT HISTORY:  Depression, HTN, TIA, shoulder scope, THA x 2  PAIN:  Are you having pain? Yes: NPRS scale: 5/10 Pain location: back, hips and thighs Pain description: dull ache Aggravating factors: worse at night Psin up to 7/10 Relieving factors: better in the day pain can be a 3/10 Tylenol , Advil  PRECAUTIONS: None  RED FLAGS: None   WEIGHT BEARING RESTRICTIONS: No  FALLS:  Has patient fallen in last 6 months? Yes. Number of falls 1  LIVING ENVIRONMENT: Lives with: lives alone Lives in: House/apartment Stairs: No Has following equipment at home: Single point cane, Tour manager, and Grab bars  OCCUPATION: retired  PLOF: Independent with community mobility without device, drives, shops, light yardwork, cooks and cleans  PATIENT  GOALS: do more, keep balance, keep going, feel stronger  NEXT MD VISIT: unsure  OBJECTIVE:  Note: Objective measures were completed at Evaluation unless otherwise noted.  DIAGNOSTIC FINDINGS:  none  COGNITION: Overall cognitive status: Within functional limits for tasks assessed     SENSATION: WFL  MUSCLE LENGTH: Mild tightness in the calves, HS and piriformis  POSTURE: rounded shoulders, forward head, and decreased lumbar lordosis  PALPATION: Denies tenderness, just c/o deep aches  LUMBAR ROM:   AROM eval  Flexion Decreased 50%  Extension Decreased 100%  Right lateral flexion   Left lateral flexion   Right rotation   Left rotation    (Blank rows = not tested)  LOWER EXTREMITY MMT:    MMT Right eval Left eval  Hip flexion 3+ 3+  Hip extension    Hip abduction 3+ 3_+  Hip adduction    Hip internal rotation    Hip external rotation    Knee flexion 3+ 3+  Knee extension 4- 4-  Ankle dorsiflexion 4- 4-  Ankle plantarflexion    Ankle inversion    Ankle eversion     (Blank rows = not tested)  FUNCTIONAL TESTS:  TUG = 42 seconds with SPC 3 MWT= 200 feet Getting in and out of car:  She has to lift the right leg with her hands, reports difficulty a 7/10  GAIT: Distance walked: 200 feet  Assistive device utilized: Single point cane Level of assistance: Modified independence Comments: very slow, very unsure of herself, tends to use walls and furniture to help with balance  TREATMENT DATE:  05/07/24 Nustep level 5 x 5 minutes 2# LAQ 2x10 2# marches 2x10 Standing with walker 2# hip abduction Ball b/n knees squeeze Red tband HS curls Seated red tband rows Gait with SPC and HHA around 1/2 the parking island one rest break  04/30/24 Nustep level  x 6 minutes Sit to stand 2x5  Ball b/n knees squeeze 2x10 Red tband HS curls 2x10 2lb LAQ 2x10 2lb marches 2x10 Shoulder ext red 2x10  04/24/24 1.5# LAQ 2x10 1.5# marches 2x10 Red tband HS curls 2x10 Ball  b/n knees squeeze 2x10 Red tband clamshells 2x10 Toe and heel  raises in sitting Nustep level 5 x 5 minutes Gait outside out the back door with [facility] and HHA through the grass negotiated curb to her car  04/09/24 Evaluation                                                                                                                                 PATIENT EDUCATION:  Education details: Evaluation, POC, HEP Person educated: Patient Education method: Medical illustrator Education comprehension: verbalized understanding  HOME EXERCISE PROGRAM: Standing at sink, marches, hip abduction, HS curls and small mini squats  ASSESSMENT:  CLINICAL IMPRESSION: Patient reports fatigue and difficulty sleeping, reports pain and cramping at night, she is very slow with movements and again does need to be redirected to continue the exercises.  We did some walking and she did feel like she needed the HHA, she was very tired with this.  Patient is a 89 y.o. female who was seen today for physical therapy evaluation and treatment for weakness, loss of balance and difficulty walking.  She reports multiple falls in the past year but only one in the past 6 months, she reports that she is really having difficulty getting in and out of the car, has to lift the leg with her hands, she reports that she has OP and the MD wants her to exercise   OBJECTIVE IMPAIRMENTS: Abnormal gait, cardiopulmonary status limiting activity, decreased activity tolerance, decreased balance, decreased endurance, decreased mobility, difficulty walking, decreased ROM, decreased strength, impaired perceived functional ability, increased muscle spasms, impaired flexibility, improper body mechanics, postural dysfunction, and pain.   REHAB POTENTIAL: Good  CLINICAL DECISION MAKING: Stable/uncomplicated  EVALUATION COMPLEXITY: Low   GOALS: Goals reviewed with patient? Yes  SHORT TERM GOALS: Target date: 05/01/24  Independent  with initial HEP Baseline: Goal status: reports that she is trying to do at home 05/07/24   LONG TERM GOALS: Target date: 07/09/24  Independent with advanced HEP Baseline:  Goal status: INITIAL  2.  Decrease TUG time to 25 seconds Baseline: 42 seconds Goal status: INITIAL  3.  Improve 3 minute walk test to 400 feet Baseline:  Goal status: INITIAL  4.  Report 50% easier to get in and out of car Baseline: reports difficulty an 8/10 Goal status: INITIAL  5.  Increase lumbar ROM 25% Baseline:  Goal status: INITIAL  PLAN:  PT FREQUENCY: 1-2x/week  PT DURATION: 12 weeks  PLANNED INTERVENTIONS: 97164- PT Re-evaluation, 97110-Therapeutic exercises, 97530- Therapeutic activity, 97112- Neuromuscular re-education, 97535- Self Care, 02859- Manual therapy, [phone]- Gait training, [phone]- Electrical stimulation (unattended), Patient/Family education, Balance training, Stair training, Cryotherapy, and Moist heat.  PLAN FOR NEXT SESSION: Gym activities, balance, strength, endurance and gait   Sparrow [place] W, PT 05/07/2024, 2:41 PM

## 2024-05-14 ENCOUNTER — Ambulatory Visit: Admitting: Physical Therapy

## 2024-05-14 ENCOUNTER — Encounter: Payer: Self-pay | Admitting: Physical Therapy

## 2024-05-14 DIAGNOSIS — R262 Difficulty in walking, not elsewhere classified: Secondary | ICD-10-CM

## 2024-05-14 DIAGNOSIS — M6281 Muscle weakness (generalized): Secondary | ICD-10-CM

## 2024-05-14 NOTE — Therapy (Signed)
 OUTPATIENT PHYSICAL THERAPY THORACOLUMBAR TREATMENT   Patient Name: Tina [name] MRN: [id number] DOB:1931-01-25, 89 y.o., female Today's Date: 05/14/2024  END OF SESSION:  PT End of Session - 05/14/24 1301     Visit Number 5    Date for Recertification  07/09/24    Authorization Type [employer] medicare    PT Start Time 1300    PT Stop Time 1345    PT Time Calculation (min) 45 min    Activity Tolerance Patient tolerated treatment well    Behavior During Therapy WFL for tasks assessed/performed          Past Medical History:  Diagnosis Date   Canker sore    Cystocele    Depression    Diverticulosis    Esophageal dysmotility    Esophageal stricture    GERD (gastroesophageal reflux disease)    Heartburn    Hypertension    TIA (transient ischemic attack)    Past Surgical History:  Procedure Laterality Date   APPENDECTOMY     BRAVO PH STUDY N/A 01/01/2020   Procedure: BRAVO PH STUDY- 48hrs;  Surgeon: Shila Gustav GAILS, MD;  Location: [facility];  Service: Endoscopy;  Laterality: N/A;   CATARACT EXTRACTION     COLON SURGERY     ESOPHAGOGASTRODUODENOSCOPY N/A 01/01/2020   Procedure: ESOPHAGOGASTRODUODENOSCOPY (EGD);  Surgeon: Nandigam, Kavitha V, MD;  Location: [facility];  Service: Endoscopy;  Laterality: N/A;   JOINT REPLACEMENT     PARTIAL COLECTOMY  09/1991   due to diverticulitis   SHOULDER SURGERY     right shoulder- removal calcium  deposit   TOTAL HIP ARTHROPLASTY  6/03   TOTAL HIP ARTHROPLASTY Left 02/24/2016   Procedure: LEFT TOTAL HIP ARTHROPLASTY ANTERIOR APPROACH;  Surgeon: Dempsey Moan, MD;  Location: [facility];  Service: Orthopedics;  Laterality: Left;   Patient Active Problem List   Diagnosis Date Noted   Hyperlipidemia 04/18/2022   ILD (interstitial lung disease) (HCC) 12/22/2021   Chronic cough 12/22/2021   Chest pressure 10/16/2020   HTN (hypertension) 02/17/2020   Coronary artery calcification 11/20/2019   Angina pectoris 11/20/2019   OA  (osteoarthritis) of hip 02/24/2016   Gastro-esophageal reflux disease without esophagitis 08/26/2013   Diverticulosis of colon 02/08/2012   Dysphagia 02/08/2012    PCP: Seabron, MD  REFERRING PROVIDER: DOROTHA Alexander, MD  REFERRING DIAG: Osteoporosis, back pain, weakness  Rationale for Evaluation and Treatment: Rehabilitation  THERAPY DIAG:  Difficulty in walking, not elsewhere classified  Muscle weakness (generalized)  ONSET DATE: 03/11/24  SUBJECTIVE:  SUBJECTIVE STATEMENT: 05/14/24  I really go slow and don't do much anymore.  Denies pain or falls  04/09/24 Patient reports that she has been having more and more problems getting around, she reports numerous falls, but reports just one fall in the past 6 months.  She reports that she is more and more stooped over and feels weak, she does report pain in the back and the hips and pelvis and legs, reports that seems to be after starting the Prolia  PERTINENT HISTORY:  Depression, HTN, TIA, shoulder scope, THA x 2  PAIN:  Are you having pain? Yes: NPRS scale: 5/10 Pain location: back, hips and thighs Pain description: dull ache Aggravating factors: worse at night Psin up to 7/10 Relieving factors: better in the day pain can be a 3/10 Tylenol , Advil  PRECAUTIONS: None  RED FLAGS: None   WEIGHT BEARING RESTRICTIONS: No  FALLS:  Has patient fallen in last 6 months? Yes. Number of falls 1  LIVING ENVIRONMENT: Lives with: lives alone Lives in: House/apartment Stairs: No Has following equipment at home: Single point cane, Tour manager, and Grab bars  OCCUPATION: retired  PLOF: Independent with community mobility without device, drives, shops, light yardwork, cooks and cleans  PATIENT GOALS: do more, keep balance, keep going, feel stronger  NEXT  MD VISIT: unsure  OBJECTIVE:  Note: Objective measures were completed at Evaluation unless otherwise noted.  DIAGNOSTIC FINDINGS:  none  COGNITION: Overall cognitive status: Within functional limits for tasks assessed     SENSATION: WFL  MUSCLE LENGTH: Mild tightness in the calves, HS and piriformis  POSTURE: rounded shoulders, forward head, and decreased lumbar lordosis  PALPATION: Denies tenderness, just c/o deep aches  LUMBAR ROM:   AROM eval  Flexion Decreased 50%  Extension Decreased 100%  Right lateral flexion   Left lateral flexion   Right rotation   Left rotation    (Blank rows = not tested)  LOWER EXTREMITY MMT:    MMT Right eval Left eval  Hip flexion 3+ 3+  Hip extension    Hip abduction 3+ 3_+  Hip adduction    Hip internal rotation    Hip external rotation    Knee flexion 3+ 3+  Knee extension 4- 4-  Ankle dorsiflexion 4- 4-  Ankle plantarflexion    Ankle inversion    Ankle eversion     (Blank rows = not tested)  FUNCTIONAL TESTS:  TUG = 42 seconds with SPC 3 MWT= 200 feet Getting in and out of car:  She has to lift the right leg with her hands, reports difficulty a 7/10  GAIT: Distance walked: 200 feet  Assistive device utilized: Single point cane Level of assistance: Modified independence Comments: very slow, very unsure of herself, tends to use walls and furniture to help with balance  TREATMENT DATE:  05/14/24 Nustep level 5 x 6 minutes Gait outside with [facility] and HHA around 1/2 the parking island 2.5# LAQ 2.5# marches Standing hip abduction 2# Ball b/n knees squeeze Red tband HS curls Red tband clamshells Gait out to the car with [facility]  05/07/24 Nustep level 5 x 5 minutes 2# LAQ 2x10 2# marches 2x10 Standing with walker 2# hip abduction Ball b/n knees squeeze Red tband HS curls Seated red tband rows Gait with SPC and HHA around 1/2 the parking island one rest break  04/30/24 Nustep level  x 6 minutes Sit to stand 2x5   Ball b/n knees squeeze 2x10 Red tband HS curls 2x10 2lb  LAQ 2x10 2lb marches 2x10 Shoulder ext red 2x10  04/24/24 1.5# LAQ 2x10 1.5# marches 2x10 Red tband HS curls 2x10 Ball b/n knees squeeze 2x10 Red tband clamshells 2x10 Toe and heel raises in sitting Nustep level 5 x 5 minutes Gait outside out the back door with [facility] and HHA through the grass negotiated curb to her car  04/09/24 Evaluation                                                                                                                                 PATIENT EDUCATION:  Education details: Evaluation, POC, HEP Person educated: Patient Education method: Medical illustrator Education comprehension: verbalized understanding  HOME EXERCISE PROGRAM: Standing at sink, marches, hip abduction, HS curls and small mini squats  ASSESSMENT:  CLINICAL IMPRESSION: Patient reports fatigue and difficulty sleeping, she is very slow with movements, she continues to want to do HHA with the [facility] for longer distances walking and especially on curbs.    Patient is a 89 y.o. female who was seen today for physical therapy evaluation and treatment for weakness, loss of balance and difficulty walking.  She reports multiple falls in the past year but only one in the past 6 months, she reports that she is really having difficulty getting in and out of the car, has to lift the leg with her hands, she reports that she has OP and the MD wants her to exercise   OBJECTIVE IMPAIRMENTS: Abnormal gait, cardiopulmonary status limiting activity, decreased activity tolerance, decreased balance, decreased endurance, decreased mobility, difficulty walking, decreased ROM, decreased strength, impaired perceived functional ability, increased muscle spasms, impaired flexibility, improper body mechanics, postural dysfunction, and pain.   REHAB POTENTIAL: Good  CLINICAL DECISION MAKING: Stable/uncomplicated  EVALUATION COMPLEXITY:  Low   GOALS: Goals reviewed with patient? Yes  SHORT TERM GOALS: Target date: 05/01/24  Independent with initial HEP Baseline: Goal status: met 05/14/24   LONG TERM GOALS: Target date: 07/09/24  Independent with advanced HEP Baseline:  Goal status: INITIAL  2.  Decrease TUG time to 25 seconds Baseline: 42 seconds Goal status: INITIAL  3.  Improve 3 minute walk test to 400 feet Baseline:  Goal status: INITIAL  4.  Report 50% easier to get in and out of car Baseline: reports difficulty an 8/10 Goal status: progressing 05/14/24 reports pain a 6/10  5.  Increase lumbar ROM 25% Baseline:  Goal status: INITIAL  PLAN:  PT FREQUENCY: 1-2x/week  PT DURATION: 12 weeks  PLANNED INTERVENTIONS: 97164- PT Re-evaluation, 97110-Therapeutic exercises, 97530- Therapeutic activity, 97112- Neuromuscular re-education, 97535- Self Care, 02859- Manual therapy, [phone]- Gait training, [phone]- Electrical stimulation (unattended), Patient/Family education, Balance training, Stair training, Cryotherapy, and Moist heat.  PLAN FOR NEXT SESSION: Gym activities, balance, strength, endurance and gait   [place] Muz[place]iz W, PT 05/14/2024, 1:01 PM

## 2024-05-22 ENCOUNTER — Encounter: Payer: Self-pay | Admitting: Physical Therapy

## 2024-05-22 ENCOUNTER — Ambulatory Visit: Admitting: Physical Therapy

## 2024-05-22 DIAGNOSIS — M6281 Muscle weakness (generalized): Secondary | ICD-10-CM

## 2024-05-22 DIAGNOSIS — R262 Difficulty in walking, not elsewhere classified: Secondary | ICD-10-CM

## 2024-05-22 NOTE — Therapy (Signed)
 OUTPATIENT PHYSICAL THERAPY THORACOLUMBAR TREATMENT   Patient Name: DHANA TOTTON MRN: [id number] DOB:1931/05/27, 89 y.o., female Today's Date: 05/22/2024  END OF SESSION:  PT End of Session - 05/22/24 1448     Visit Number 6    Date for Recertification  07/09/24    Authorization Type [employer] medicare    PT Start Time 1443    PT Stop Time 1528    PT Time Calculation (min) 45 min    Activity Tolerance Patient tolerated treatment well    Behavior During Therapy WFL for tasks assessed/performed          Past Medical History:  Diagnosis Date   Canker sore    Cystocele    Depression    Diverticulosis    Esophageal dysmotility    Esophageal stricture    GERD (gastroesophageal reflux disease)    Heartburn    Hypertension    TIA (transient ischemic attack)    Past Surgical History:  Procedure Laterality Date   APPENDECTOMY     BRAVO PH STUDY N/A 01/01/2020   Procedure: BRAVO PH STUDY- 48hrs;  Surgeon: Shila Gustav GAILS, MD;  Location: [facility];  Service: Endoscopy;  Laterality: N/A;   CATARACT EXTRACTION     COLON SURGERY     ESOPHAGOGASTRODUODENOSCOPY N/A 01/01/2020   Procedure: ESOPHAGOGASTRODUODENOSCOPY (EGD);  Surgeon: Nandigam, Kavitha V, MD;  Location: [facility];  Service: Endoscopy;  Laterality: N/A;   JOINT REPLACEMENT     PARTIAL COLECTOMY  09/1991   due to diverticulitis   SHOULDER SURGERY     right shoulder- removal calcium  deposit   TOTAL HIP ARTHROPLASTY  6/03   TOTAL HIP ARTHROPLASTY Left 02/24/2016   Procedure: LEFT TOTAL HIP ARTHROPLASTY ANTERIOR APPROACH;  Surgeon: Dempsey Moan, MD;  Location: [facility];  Service: Orthopedics;  Laterality: Left;   Patient Active Problem List   Diagnosis Date Noted   Hyperlipidemia 04/18/2022   ILD (interstitial lung disease) (HCC) 12/22/2021   Chronic cough 12/22/2021   Chest pressure 10/16/2020   HTN (hypertension) 02/17/2020   Coronary artery calcification 11/20/2019   Angina pectoris 11/20/2019   OA  (osteoarthritis) of hip 02/24/2016   Gastro-esophageal reflux disease without esophagitis 08/26/2013   Diverticulosis of colon 02/08/2012   Dysphagia 02/08/2012    PCP: Seabron, MD  REFERRING PROVIDER: DOROTHA Alexander, MD  REFERRING DIAG: Osteoporosis, back pain, weakness  Rationale for Evaluation and Treatment: Rehabilitation  THERAPY DIAG:  Difficulty in walking, not elsewhere classified  Muscle weakness (generalized)  ONSET DATE: 03/11/24  SUBJECTIVE:  SUBJECTIVE STATEMENT: My back and left hip just really ache at night, pain a 7/10, just constant, once I get up and move it feels a little better  04/09/24 Patient reports that she has been having more and more problems getting around, she reports numerous falls, but reports just one fall in the past 6 months.  She reports that she is more and more stooped over and feels weak, she does report pain in the back and the hips and pelvis and legs, reports that seems to be after starting the Prolia  PERTINENT HISTORY:  Depression, HTN, TIA, shoulder scope, THA x 2  PAIN:  Are you having pain? Yes: NPRS scale: 5/10 Pain location: back, hips and thighs Pain description: dull ache Aggravating factors: worse at night Psin up to 7/10 Relieving factors: better in the day pain can be a 3/10 Tylenol , Advil  PRECAUTIONS: None  RED FLAGS: None   WEIGHT BEARING RESTRICTIONS: No  FALLS:  Has patient fallen in last 6 months? Yes. Number of falls 1  LIVING ENVIRONMENT: Lives with: lives alone Lives in: House/apartment Stairs: No Has following equipment at home: Single point cane, Tour manager, and Grab bars  OCCUPATION: retired  PLOF: Independent with community mobility without device, drives, shops, light yardwork, cooks and cleans  PATIENT GOALS: do more,  keep balance, keep going, feel stronger  NEXT MD VISIT: unsure  OBJECTIVE:  Note: Objective measures were completed at Evaluation unless otherwise noted.  DIAGNOSTIC FINDINGS:  none  COGNITION: Overall cognitive status: Within functional limits for tasks assessed     SENSATION: WFL  MUSCLE LENGTH: Mild tightness in the calves, HS and piriformis  POSTURE: rounded shoulders, forward head, and decreased lumbar lordosis  PALPATION: Denies tenderness, just c/o deep aches  LUMBAR ROM:   AROM eval  Flexion Decreased 50%  Extension Decreased 100%  Right lateral flexion   Left lateral flexion   Right rotation   Left rotation    (Blank rows = not tested)  LOWER EXTREMITY MMT:    MMT Right eval Left eval  Hip flexion 3+ 3+  Hip extension    Hip abduction 3+ 3_+  Hip adduction    Hip internal rotation    Hip external rotation    Knee flexion 3+ 3+  Knee extension 4- 4-  Ankle dorsiflexion 4- 4-  Ankle plantarflexion    Ankle inversion    Ankle eversion     (Blank rows = not tested)  FUNCTIONAL TESTS:  TUG = 42 seconds with SPC 3 MWT= 200 feet Getting in and out of car:  She has to lift the right leg with her hands, reports difficulty a 7/10  GAIT: Distance walked: 200 feet  Assistive device utilized: Single point cane Level of assistance: Modified independence Comments: very slow, very unsure of herself, tends to use walls and furniture to help with balance  TREATMENT DATE:  05/22/24 Nustep level 5 x 6 minutes 2.5# LAQ 2.5# marches 2.5# hip abduction Gait outside with CGA 1/2 parking [place], some small shuffling gait, cues to pick up feet Ball b/n knees squeeze Red tband clamshells STM to the left hip and buttock  05/14/24 Nustep level 5 x 6 minutes Gait outside with [employer] [facility] and HHA around 1/2 the parking [employer] 2.5# LAQ 2.5# marches Standing hip abduction 2# Ball b/n knees squeeze Red tband HS curls Red tband clamshells Gait out to the car with  [facility]  05/07/24 Nustep level 5 x 5 minutes 2# LAQ 2x10 2# marches 2x10  Standing with walker 2# hip abduction Ball b/n knees squeeze Red tband HS curls Seated red tband rows Gait with SPC and HHA around 1/2 the parking island one rest break  04/30/24 Nustep level  x 6 minutes Sit to stand 2x5  Ball b/n knees squeeze 2x10 Red tband HS curls 2x10 2lb LAQ 2x10 2lb marches 2x10 Shoulder ext red 2x10  04/24/24 1.5# LAQ 2x10 1.5# marches 2x10 Red tband HS curls 2x10 Ball b/n knees squeeze 2x10 Red tband clamshells 2x10 Toe and heel raises in sitting Nustep level 5 x 5 minutes Gait outside out the back door with [facility] and HHA through the grass negotiated curb to her car  04/09/24 Evaluation                                                                                                                                 PATIENT EDUCATION:  Education details: Evaluation, POC, HEP Person educated: Patient Education method: Medical illustrator Education comprehension: verbalized understanding  HOME EXERCISE PROGRAM: Standing at sink, marches, hip abduction, HS curls and small mini squats  ASSESSMENT:  CLINICAL IMPRESSION: Patient reports still difficulty with pain at night in the left hip and buttock.  Reports cannot get it to go away.  I tried some STM with the Tgun today on this area.  We did walk today outside, she is slow and tends to shuffle and take small steps, cues to try to change this for safety.  Patient is a 89 y.o. female who was seen today for physical therapy evaluation and treatment for weakness, loss of balance and difficulty walking.  She reports multiple falls in the past year but only one in the past 6 months, she reports that she is really having difficulty getting in and out of the car, has to lift the leg with her hands, she reports that she has OP and the MD wants her to exercise   OBJECTIVE IMPAIRMENTS: Abnormal gait, cardiopulmonary status limiting  activity, decreased activity tolerance, decreased balance, decreased endurance, decreased mobility, difficulty walking, decreased ROM, decreased strength, impaired perceived functional ability, increased muscle spasms, impaired flexibility, improper body mechanics, postural dysfunction, and pain.   REHAB POTENTIAL: Good  CLINICAL DECISION MAKING: Stable/uncomplicated  EVALUATION COMPLEXITY: Low   GOALS: Goals reviewed with patient? Yes  SHORT TERM GOALS: Target date: 05/01/24  Independent with initial HEP Baseline: Goal status: met 05/14/24   LONG TERM GOALS: Target date: 07/09/24  Independent with advanced HEP Baseline:  Goal status: INITIAL  2.  Decrease TUG time to 25 seconds Baseline: 42 seconds Goal status: ongoing 05/22/24  3.  Improve 3 minute walk test to 400 feet Baseline:  Goal status: ongoing 05/22/24  4.  Report 50% easier to get in and out of car Baseline: reports difficulty an 8/10 Goal status: progressing 05/14/24 reports pain a 6/10  5.  Increase lumbar ROM 25% Baseline:  Goal status: INITIAL  PLAN:  PT FREQUENCY:  1-2x/week  PT DURATION: 12 weeks  PLANNED INTERVENTIONS: 97164- PT Re-evaluation, 97110-Therapeutic exercises, 97530- Therapeutic activity, [id number]- Neuromuscular re-education, 97535- Self Care, [id number]- Manual therapy, [phone]- Gait training, [phone]- Electrical stimulation (unattended), Patient/Family education, Balance training, Stair training, Cryotherapy, and Moist heat.  PLAN FOR NEXT SESSION: Gym activities, balance, strength, endurance and gait   Benjamin Merrihew W, PT 05/22/2024, 2:49 PM

## 2024-05-29 ENCOUNTER — Ambulatory Visit: Admitting: Physical Therapy

## 2024-05-29 ENCOUNTER — Encounter: Payer: Self-pay | Admitting: Physical Therapy

## 2024-05-29 DIAGNOSIS — M6281 Muscle weakness (generalized): Secondary | ICD-10-CM

## 2024-05-29 DIAGNOSIS — R262 Difficulty in walking, not elsewhere classified: Secondary | ICD-10-CM | POA: Diagnosis not present

## 2024-05-29 NOTE — Therapy (Signed)
 OUTPATIENT PHYSICAL THERAPY THORACOLUMBAR TREATMENT   Patient Name: Tina [name] MRN: [id number] DOB:1930/12/23, 89 y.o., female Today's Date: 05/29/2024  END OF SESSION:  PT End of Session - 05/29/24 1311     Visit Number 7    Date for Recertification  07/09/24    Authorization Type [employer] medicare    PT Start Time 1308    PT Stop Time 1355    PT Time Calculation (min) 47 min    Activity Tolerance Patient tolerated treatment well    Behavior During Therapy WFL for tasks assessed/performed          Past Medical History:  Diagnosis Date   Canker sore    Cystocele    Depression    Diverticulosis    Esophageal dysmotility    Esophageal stricture    GERD (gastroesophageal reflux disease)    Heartburn    Hypertension    TIA (transient ischemic attack)    Past Surgical History:  Procedure Laterality Date   APPENDECTOMY     BRAVO PH STUDY N/A 01/01/2020   Procedure: BRAVO PH STUDY- 48hrs;  Surgeon: Shila Gustav GAILS, MD;  Location: [facility];  Service: Endoscopy;  Laterality: N/A;   CATARACT EXTRACTION     COLON SURGERY     ESOPHAGOGASTRODUODENOSCOPY N/A 01/01/2020   Procedure: ESOPHAGOGASTRODUODENOSCOPY (EGD);  Surgeon: Nandigam, Kavitha V, MD;  Location: [facility];  Service: Endoscopy;  Laterality: N/A;   JOINT REPLACEMENT     PARTIAL COLECTOMY  09/1991   due to diverticulitis   SHOULDER SURGERY     right shoulder- removal calcium  deposit   TOTAL HIP ARTHROPLASTY  6/03   TOTAL HIP ARTHROPLASTY Left 02/24/2016   Procedure: LEFT TOTAL HIP ARTHROPLASTY ANTERIOR APPROACH;  Surgeon: Dempsey Moan, MD;  Location: [facility];  Service: Orthopedics;  Laterality: Left;   Patient Active Problem List   Diagnosis Date Noted   Hyperlipidemia 04/18/2022   ILD (interstitial lung disease) (HCC) 12/22/2021   Chronic cough 12/22/2021   Chest pressure 10/16/2020   HTN (hypertension) 02/17/2020   Coronary artery calcification 11/20/2019   Angina pectoris 11/20/2019   OA  (osteoarthritis) of hip 02/24/2016   Gastro-esophageal reflux disease without esophagitis 08/26/2013   Diverticulosis of colon 02/08/2012   Dysphagia 02/08/2012    PCP: Seabron, MD  REFERRING PROVIDER: DOROTHA Alexander, MD  REFERRING DIAG: Osteoporosis, back pain, weakness  Rationale for Evaluation and Treatment: Rehabilitation  THERAPY DIAG:  Difficulty in walking, not elsewhere classified  Muscle weakness (generalized)  ONSET DATE: 03/11/24  SUBJECTIVE:  SUBJECTIVE STATEMENT: Still reports that her back and hip are hurting, maybe the medicine and the exercise are helping, I don;t think I am in as much pain  04/09/24 Patient reports that she has been having more and more problems getting around, she reports numerous falls, but reports just one fall in the past 6 months.  She reports that she is more and more stooped over and feels weak, she does report pain in the back and the hips and pelvis and legs, reports that seems to be after starting the Prolia  PERTINENT HISTORY:  Depression, HTN, TIA, shoulder scope, THA x 2  PAIN:  Are you having pain? Yes: NPRS scale: 5/10 Pain location: back, hips and thighs Pain description: dull ache Aggravating factors: worse at night Psin up to 7/10 Relieving factors: better in the day pain can be a 3/10 Tylenol , Advil  PRECAUTIONS: None  RED FLAGS: None   WEIGHT BEARING RESTRICTIONS: No  FALLS:  Has patient fallen in last 6 months? Yes. Number of falls 1  LIVING ENVIRONMENT: Lives with: lives alone Lives in: House/apartment Stairs: No Has following equipment at home: Single point cane, Tour manager, and Grab bars  OCCUPATION: retired  PLOF: Independent with community mobility without device, drives, shops, light yardwork, cooks and cleans  PATIENT GOALS:  do more, keep balance, keep going, feel stronger  NEXT MD VISIT: unsure  OBJECTIVE:  Note: Objective measures were completed at Evaluation unless otherwise noted.  DIAGNOSTIC FINDINGS:  none  COGNITION: Overall cognitive status: Within functional limits for tasks assessed     SENSATION: WFL  MUSCLE LENGTH: Mild tightness in the calves, HS and piriformis  POSTURE: rounded shoulders, forward head, and decreased lumbar lordosis  PALPATION: Denies tenderness, just c/o deep aches  LUMBAR ROM:   AROM eval  Flexion Decreased 50%  Extension Decreased 100%  Right lateral flexion   Left lateral flexion   Right rotation   Left rotation    (Blank rows = not tested)  LOWER EXTREMITY MMT:    MMT Right eval Left eval  Hip flexion 3+ 3+  Hip extension    Hip abduction 3+ 3_+  Hip adduction    Hip internal rotation    Hip external rotation    Knee flexion 3+ 3+  Knee extension 4- 4-  Ankle dorsiflexion 4- 4-  Ankle plantarflexion    Ankle inversion    Ankle eversion     (Blank rows = not tested)  FUNCTIONAL TESTS:  TUG = 42 seconds with SPC 3 MWT= 200 feet Getting in and out of car:  She has to lift the right leg with her hands, reports difficulty a 7/10  GAIT: Distance walked: 200 feet  Assistive device utilized: Single point cane Level of assistance: Modified independence Comments: very slow, very unsure of herself, tends to use walls and furniture to help with balance  TREATMENT DATE:  05/29/24 Nustep level 5 x 6 minutes Red tband clamshells Ball b/n knees squeeze 2.5# LAQ 2.5# marches 2.5# hip abduction Side stepping Direction changes Side step over bar Fwd bkwd step over bar Cone toe touches with cane Gait out to car with good pace  05/22/24 Nustep level 5 x 6 minutes 2.5# LAQ 2.5# marches 2.5# hip abduction Gait outside with CGA 1/2 parking [place], some small shuffling gait, cues to pick up feet Ball b/n knees squeeze Red tband  clamshells STM to the left hip and buttock  05/14/24 Nustep level 5 x 6 minutes Gait outside with [facility]  and HHA around 1/2 the parking island 2.5# LAQ 2.5# marches Standing hip abduction 2# Ball b/n knees squeeze Red tband HS curls Red tband clamshells Gait out to the car with [facility]  05/07/24 Nustep level 5 x 5 minutes 2# LAQ 2x10 2# marches 2x10 Standing with walker 2# hip abduction Ball b/n knees squeeze Red tband HS curls Seated red tband rows Gait with SPC and HHA around 1/2 the parking island one rest break  04/30/24 Nustep level  x 6 minutes Sit to stand 2x5  Ball b/n knees squeeze 2x10 Red tband HS curls 2x10 2lb LAQ 2x10 2lb marches 2x10 Shoulder ext red 2x10  04/24/24 1.5# LAQ 2x10 1.5# marches 2x10 Red tband HS curls 2x10 Ball b/n knees squeeze 2x10 Red tband clamshells 2x10 Toe and heel raises in sitting Nustep level 5 x 5 minutes Gait outside out the back door with [facility] and HHA through the grass negotiated curb to her car  04/09/24 Evaluation                                                                                                                                 PATIENT EDUCATION:  Education details: Evaluation, POC, HEP Person educated: Patient Education method: Medical Illustrator Education comprehension: verbalized understanding  HOME EXERCISE PROGRAM: Standing at sink, marches, hip abduction, HS curls and small mini squats  ASSESSMENT:  CLINICAL IMPRESSION: Patient reports that she is overall having less pain, reports that she feels she is getting in and out of the car easier, still pain and still some difficulty walking, goes very slow and reports that she takes her time  Patient is a 89 y.o. female who was seen today for physical therapy evaluation and treatment for weakness, loss of balance and difficulty walking.  She reports multiple falls in the past year but only one in the past 6 months, she reports that she is really having  difficulty getting in and out of the car, has to lift the leg with her hands, she reports that she has OP and the MD wants her to exercise   OBJECTIVE IMPAIRMENTS: Abnormal gait, cardiopulmonary status limiting activity, decreased activity tolerance, decreased balance, decreased endurance, decreased mobility, difficulty walking, decreased ROM, decreased strength, impaired perceived functional ability, increased muscle spasms, impaired flexibility, improper body mechanics, postural dysfunction, and pain.   REHAB POTENTIAL: Good  CLINICAL DECISION MAKING: Stable/uncomplicated  EVALUATION COMPLEXITY: Low   GOALS: Goals reviewed with patient? Yes  SHORT TERM GOALS: Target date: 05/01/24  Independent with initial HEP Baseline: Goal status: met 05/14/24   LONG TERM GOALS: Target date: 07/09/24  Independent with advanced HEP Baseline:  Goal status: ongoing 05/29/24  2.  Decrease TUG time to 25 seconds Baseline: 42 seconds Goal status: ongoing 05/22/24  3.  Improve 3 minute walk test to 400 feet Baseline:  Goal status: ongoing 05/22/24  4.  Report 50% easier to get in and out of car Baseline:  reports difficulty an 8/10 Goal status: progressing 05/14/24 reports pain a 6/10  5.  Increase lumbar ROM 25% Baseline:  Goal status: ongoing 05/29/24  PLAN:  PT FREQUENCY: 1-2x/week  PT DURATION: 12 weeks  PLANNED INTERVENTIONS: 97164- PT Re-evaluation, 97110-Therapeutic exercises, 97530- Therapeutic activity, 97112- Neuromuscular re-education, 97535- Self Care, 02859- Manual therapy, [phone]- Gait training, [phone]- Electrical stimulation (unattended), Patient/Family education, Balance training, Stair training, Cryotherapy, and Moist heat.  PLAN FOR NEXT SESSION: Gym activities, balance, strength, endurance and gait   Hendrix Yurkovich W, PT 05/29/2024, 1:12 PM

## 2024-06-04 ENCOUNTER — Ambulatory Visit: Attending: Internal Medicine | Admitting: Physical Therapy

## 2024-06-04 ENCOUNTER — Encounter: Payer: Self-pay | Admitting: Physical Therapy

## 2024-06-04 DIAGNOSIS — R262 Difficulty in walking, not elsewhere classified: Secondary | ICD-10-CM | POA: Insufficient documentation

## 2024-06-04 DIAGNOSIS — M6281 Muscle weakness (generalized): Secondary | ICD-10-CM | POA: Diagnosis present

## 2024-06-04 NOTE — Therapy (Signed)
 OUTPATIENT PHYSICAL THERAPY THORACOLUMBAR TREATMENT   Patient Name: Tina [name] MRN: [id number] DOB:October 01, 1930, 89 y.o., female Today's Date: 06/04/2024  END OF SESSION:  PT End of Session - 06/04/24 1309     Visit Number 8    Date for Recertification  07/09/24    Authorization Type [employer] medicare 7 of 12    PT Start Time 1309    PT Stop Time 1355    PT Time Calculation (min) 46 min    Activity Tolerance Patient tolerated treatment well    Behavior During Therapy WFL for tasks assessed/performed          Past Medical History:  Diagnosis Date   Canker sore    Cystocele    Depression    Diverticulosis    Esophageal dysmotility    Esophageal stricture    GERD (gastroesophageal reflux disease)    Heartburn    Hypertension    TIA (transient ischemic attack)    Past Surgical History:  Procedure Laterality Date   APPENDECTOMY     BRAVO PH STUDY N/A 01/01/2020   Procedure: BRAVO PH STUDY- 48hrs;  Surgeon: Shila Gustav GAILS, MD;  Location: [facility];  Service: Endoscopy;  Laterality: N/A;   CATARACT EXTRACTION     COLON SURGERY     ESOPHAGOGASTRODUODENOSCOPY N/A 01/01/2020   Procedure: ESOPHAGOGASTRODUODENOSCOPY (EGD);  Surgeon: Nandigam, Kavitha V, MD;  Location: [facility];  Service: Endoscopy;  Laterality: N/A;   JOINT REPLACEMENT     PARTIAL COLECTOMY  09/1991   due to diverticulitis   SHOULDER SURGERY     right shoulder- removal calcium  deposit   TOTAL HIP ARTHROPLASTY  6/03   TOTAL HIP ARTHROPLASTY Left 02/24/2016   Procedure: LEFT TOTAL HIP ARTHROPLASTY ANTERIOR APPROACH;  Surgeon: Dempsey Moan, MD;  Location: [facility];  Service: Orthopedics;  Laterality: Left;   Patient Active Problem List   Diagnosis Date Noted   Hyperlipidemia 04/18/2022   ILD (interstitial lung disease) (HCC) 12/22/2021   Chronic cough 12/22/2021   Chest pressure 10/16/2020   HTN (hypertension) 02/17/2020   Coronary artery calcification 11/20/2019   Angina pectoris 11/20/2019   OA  (osteoarthritis) of hip 02/24/2016   Gastro-esophageal reflux disease without esophagitis 08/26/2013   Diverticulosis of colon 02/08/2012   Dysphagia 02/08/2012    PCP: Seabron, MD  REFERRING PROVIDER: DOROTHA Alexander, MD  REFERRING DIAG: Osteoporosis, back pain, weakness  Rationale for Evaluation and Treatment: Rehabilitation  THERAPY DIAG:  Difficulty in walking, not elsewhere classified  Muscle weakness (generalized)  ONSET DATE: 03/11/24  SUBJECTIVE:  SUBJECTIVE STATEMENT: Doing okay, just move slow  04/09/24 Patient reports that she has been having more and more problems getting around, she reports numerous falls, but reports just one fall in the past 6 months.  She reports that she is more and more stooped over and feels weak, she does report pain in the back and the hips and pelvis and legs, reports that seems to be after starting the Prolia  PERTINENT HISTORY:  Depression, HTN, TIA, shoulder scope, THA x 2  PAIN:  Are you having pain? Yes: NPRS scale: 5/10 Pain location: back, hips and thighs Pain description: dull ache Aggravating factors: worse at night Psin up to 7/10 Relieving factors: better in the day pain can be a 3/10 Tylenol , Advil  PRECAUTIONS: None  RED FLAGS: None   WEIGHT BEARING RESTRICTIONS: No  FALLS:  Has patient fallen in last 6 months? Yes. Number of falls 1  LIVING ENVIRONMENT: Lives with: lives alone Lives in: House/apartment Stairs: No Has following equipment at home: Single point cane, Tour manager, and Grab bars  OCCUPATION: retired  PLOF: Independent with community mobility without device, drives, shops, light yardwork, cooks and cleans  PATIENT GOALS: do more, keep balance, keep going, feel stronger  NEXT MD VISIT: unsure  OBJECTIVE:  Note: Objective  measures were completed at Evaluation unless otherwise noted.  DIAGNOSTIC FINDINGS:  none  COGNITION: Overall cognitive status: Within functional limits for tasks assessed     SENSATION: WFL  MUSCLE LENGTH: Mild tightness in the calves, HS and piriformis  POSTURE: rounded shoulders, forward head, and decreased lumbar lordosis  PALPATION: Denies tenderness, just c/o deep aches  LUMBAR ROM:   AROM eval  Flexion Decreased 50%  Extension Decreased 100%  Right lateral flexion   Left lateral flexion   Right rotation   Left rotation    (Blank rows = not tested)  LOWER EXTREMITY MMT:    MMT Right eval Left eval  Hip flexion 3+ 3+  Hip extension    Hip abduction 3+ 3_+  Hip adduction    Hip internal rotation    Hip external rotation    Knee flexion 3+ 3+  Knee extension 4- 4-  Ankle dorsiflexion 4- 4-  Ankle plantarflexion    Ankle inversion    Ankle eversion     (Blank rows = not tested)  FUNCTIONAL TESTS:  TUG = 42 seconds with SPC, 06/04/24 31 seconds 3 MWT= 200 feet Getting in and out of car:  She has to lift the right leg with her hands, reports difficulty a 7/10  GAIT: Distance walked: 200 feet  Assistive device utilized: Single point cane Level of assistance: Modified independence Comments: very slow, very unsure of herself, tends to use walls and furniture to help with balance  TREATMENT DATE:  06/04/24 LEg curls 15# 2x10 Nustep level 5 x 5 minutes Gait outside around the parking [place] airex ball toss 2.5# LAQ 2.5# marches Ball b/n knees  Tug 31 seconds  05/29/24 Nustep level 5 x 6 minutes Red tband clamshells Ball b/n knees squeeze 2.5# LAQ 2.5# marches 2.5# hip abduction Side stepping Direction changes Side step over bar Fwd bkwd step over bar Cone toe touches with cane Gait out to car with good pace  05/22/24 Nustep level 5 x 6 minutes 2.5# LAQ 2.5# marches 2.5# hip abduction Gait outside with CGA 1/2 parking [place], some  small shuffling gait, cues to pick up feet Ball b/n knees squeeze Red tband clamshells STM to the left  hip and buttock  05/14/24 Nustep level 5 x 6 minutes Gait outside with [facility] and HHA around 1/2 the parking island 2.5# LAQ 2.5# marches Standing hip abduction 2# Ball b/n knees squeeze Red tband HS curls Red tband clamshells Gait out to the car with [facility]  05/07/24 Nustep level 5 x 5 minutes 2# LAQ 2x10 2# marches 2x10 Standing with walker 2# hip abduction Ball b/n knees squeeze Red tband HS curls Seated red tband rows Gait with SPC and HHA around 1/2 the parking island one rest break  04/30/24 Nustep level  x 6 minutes Sit to stand 2x5  Ball b/n knees squeeze 2x10 Red tband HS curls 2x10 2lb LAQ 2x10 2lb marches 2x10 Shoulder ext red 2x10  04/24/24 1.5# LAQ 2x10 1.5# marches 2x10 Red tband HS curls 2x10 Ball b/n knees squeeze 2x10 Red tband clamshells 2x10 Toe and heel raises in sitting Nustep level 5 x 5 minutes Gait outside out the back door with SPC and HHA through the grass negotiated curb to her car  04/09/24 Evaluation                                                                                                                                 PATIENT EDUCATION:  Education details: Evaluation, POC, HEP Person educated: Patient Education method: Medical Illustrator Education comprehension: verbalized understanding  HOME EXERCISE PROGRAM: Standing at sink, marches, hip abduction, HS curls and small mini squats  ASSESSMENT:  CLINICAL IMPRESSION: Patient reports that she is overall having less pain, reports that she feels she is getting in and out of the car easier, I retested TUG and she has improved by 11 seconds.  Patient is a 89 y.o. female who was seen today for physical therapy evaluation and treatment for weakness, loss of balance and difficulty walking.  She reports multiple falls in the past year but only one in the past 6 months, she  reports that she is really having difficulty getting in and out of the car, has to lift the leg with her hands, she reports that she has OP and the MD wants her to exercise   OBJECTIVE IMPAIRMENTS: Abnormal gait, cardiopulmonary status limiting activity, decreased activity tolerance, decreased balance, decreased endurance, decreased mobility, difficulty walking, decreased ROM, decreased strength, impaired perceived functional ability, increased muscle spasms, impaired flexibility, improper body mechanics, postural dysfunction, and pain.   REHAB POTENTIAL: Good  CLINICAL DECISION MAKING: Stable/uncomplicated  EVALUATION COMPLEXITY: Low   GOALS: Goals reviewed with patient? Yes  SHORT TERM GOALS: Target date: 05/01/24  Independent with initial HEP Baseline: Goal status: met 05/14/24   LONG TERM GOALS: Target date: 07/09/24  Independent with advanced HEP Baseline:  Goal status: ongoing 05/29/24  2.  Decrease TUG time to 25 seconds Baseline: 42 seconds Goal status: progressing 06/04/24  3.  Improve 3 minute walk test to 400 feet Baseline:  Goal status: ongoing 05/22/24  4.  Report 50% easier  to get in and out of car Baseline: reports difficulty an 8/10 Goal status: progressing 05/14/24 reports pain a 6/10, progressing 06/04/24 5/10  5.  Increase lumbar ROM 25% Baseline:  Goal status: ongoing 05/29/24  PLAN:  PT FREQUENCY: 1-2x/week  PT DURATION: 12 weeks  PLANNED INTERVENTIONS: 97164- PT Re-evaluation, 97110-Therapeutic exercises, 97530- Therapeutic activity, 97112- Neuromuscular re-education, 97535- Self Care, 02859- Manual therapy, [phone]- Gait training, [phone]- Electrical stimulation (unattended), Patient/Family education, Balance training, Stair training, Cryotherapy, and Moist heat.  PLAN FOR NEXT SESSION: Gym activities, balance, strength, endurance and gait   Maxen Rowland W, PT 06/04/2024, 1:10 PM

## 2024-06-12 ENCOUNTER — Ambulatory Visit: Admitting: Physical Therapy

## 2024-06-12 ENCOUNTER — Encounter: Payer: Self-pay | Admitting: Physical Therapy

## 2024-06-12 DIAGNOSIS — M6281 Muscle weakness (generalized): Secondary | ICD-10-CM

## 2024-06-12 DIAGNOSIS — R262 Difficulty in walking, not elsewhere classified: Secondary | ICD-10-CM

## 2024-06-12 NOTE — Therapy (Signed)
 OUTPATIENT PHYSICAL THERAPY THORACOLUMBAR TREATMENT   Patient Name: Tina [name] MRN: [id number] DOB:May 19, 1931, 89 y.o., female Today's Date: 06/12/2024  END OF SESSION:  PT End of Session - 06/12/24 1442     Visit Number 9    Date for Recertification  07/09/24    Authorization Type [employer] medicare 8 of 12    PT Start Time 1442    PT Stop Time 1527    PT Time Calculation (min) 45 min    Activity Tolerance Patient tolerated treatment well    Behavior During Therapy WFL for tasks assessed/performed          Past Medical History:  Diagnosis Date   Canker sore    Cystocele    Depression    Diverticulosis    Esophageal dysmotility    Esophageal stricture    GERD (gastroesophageal reflux disease)    Heartburn    Hypertension    TIA (transient ischemic attack)    Past Surgical History:  Procedure Laterality Date   APPENDECTOMY     BRAVO PH STUDY N/A 01/01/2020   Procedure: BRAVO PH STUDY- 48hrs;  Surgeon: Shila Gustav GAILS, MD;  Location: [facility];  Service: Endoscopy;  Laterality: N/A;   CATARACT EXTRACTION     COLON SURGERY     ESOPHAGOGASTRODUODENOSCOPY N/A 01/01/2020   Procedure: ESOPHAGOGASTRODUODENOSCOPY (EGD);  Surgeon: Nandigam, Kavitha V, MD;  Location: [facility];  Service: Endoscopy;  Laterality: N/A;   JOINT REPLACEMENT     PARTIAL COLECTOMY  09/1991   due to diverticulitis   SHOULDER SURGERY     right shoulder- removal calcium  deposit   TOTAL HIP ARTHROPLASTY  6/03   TOTAL HIP ARTHROPLASTY Left 02/24/2016   Procedure: LEFT TOTAL HIP ARTHROPLASTY ANTERIOR APPROACH;  Surgeon: Dempsey Moan, MD;  Location: [facility];  Service: Orthopedics;  Laterality: Left;   Patient Active Problem List   Diagnosis Date Noted   Hyperlipidemia 04/18/2022   ILD (interstitial lung disease) (HCC) 12/22/2021   Chronic cough 12/22/2021   Chest pressure 10/16/2020   HTN (hypertension) 02/17/2020   Coronary artery calcification 11/20/2019   Angina pectoris 11/20/2019   OA  (osteoarthritis) of hip 02/24/2016   Gastro-esophageal reflux disease without esophagitis 08/26/2013   Diverticulosis of colon 02/08/2012   Dysphagia 02/08/2012    PCP: Seabron, MD  REFERRING PROVIDER: DOROTHA Alexander, MD  REFERRING DIAG: Osteoporosis, back pain, weakness  Rationale for Evaluation and Treatment: Rehabilitation  THERAPY DIAG:  Difficulty in walking, not elsewhere classified  Muscle weakness (generalized)  ONSET DATE: 03/11/24  SUBJECTIVE:  SUBJECTIVE STATEMENT: Doing okay, no falls, just unsteady at times  04/09/24 Patient reports that she has been having more and more problems getting around, she reports numerous falls, but reports just one fall in the past 6 months.  She reports that she is more and more stooped over and feels weak, she does report pain in the back and the hips and pelvis and legs, reports that seems to be after starting the Prolia  PERTINENT HISTORY:  Depression, HTN, TIA, shoulder scope, THA x 2  PAIN:  Are you having pain? Yes: NPRS scale: 5/10 Pain location: back, hips and thighs Pain description: dull ache Aggravating factors: worse at night Psin up to 7/10 Relieving factors: better in the day pain can be a 3/10 Tylenol , Advil  PRECAUTIONS: None  RED FLAGS: None   WEIGHT BEARING RESTRICTIONS: No  FALLS:  Has patient fallen in last 6 months? Yes. Number of falls 1  LIVING ENVIRONMENT: Lives with: lives alone Lives in: House/apartment Stairs: No Has following equipment at home: Single point cane, Tour manager, and Grab bars  OCCUPATION: retired  PLOF: Independent with community mobility without device, drives, shops, light yardwork, cooks and cleans  PATIENT GOALS: do more, keep balance, keep going, feel stronger  NEXT MD VISIT: unsure  OBJECTIVE:   Note: Objective measures were completed at Evaluation unless otherwise noted.  DIAGNOSTIC FINDINGS:  none  COGNITION: Overall cognitive status: Within functional limits for tasks assessed     SENSATION: WFL  MUSCLE LENGTH: Mild tightness in the calves, HS and piriformis  POSTURE: rounded shoulders, forward head, and decreased lumbar lordosis  PALPATION: Denies tenderness, just c/o deep aches  LUMBAR ROM:   AROM eval  Flexion Decreased 50%  Extension Decreased 100%  Right lateral flexion   Left lateral flexion   Right rotation   Left rotation    (Blank rows = not tested)  LOWER EXTREMITY MMT:    MMT Right eval Left eval  Hip flexion 3+ 3+  Hip extension    Hip abduction 3+ 3_+  Hip adduction    Hip internal rotation    Hip external rotation    Knee flexion 3+ 3+  Knee extension 4- 4-  Ankle dorsiflexion 4- 4-  Ankle plantarflexion    Ankle inversion    Ankle eversion     (Blank rows = not tested)  FUNCTIONAL TESTS:  TUG = 42 seconds with SPC, 06/04/24 31 seconds 3 MWT= 200 feet Getting in and out of car:  She has to lift the right leg with her hands, reports difficulty a 7/10  GAIT: Distance walked: 200 feet  Assistive device utilized: Single point cane Level of assistance: Modified independence Comments: very slow, very unsure of herself, tends to use walls and furniture to help with balance  TREATMENT DATE:  06/12/24 Nustep level 5 x 6 minutes 3# LAQ 3#marches 3# hip abduction More square 1 and 2 blocks step overs On airex cone toe touches Side step on and off airex Standing ball toss Side stepping Fwd backward walking  06/04/24 LEg curls 15# 2x10 Nustep level 5 x 5 minutes Gait outside around the parking [place] ball toss 2.5# LAQ 2.5# marches Ball b/n knees  Tug 31 seconds  05/29/24 Nustep level 5 x 6 minutes Red tband clamshells Ball b/n knees squeeze 2.5# LAQ 2.5# marches 2.5# hip abduction Side stepping Direction  changes Side step over bar Fwd bkwd step over bar Cone toe touches with cane Gait out to car with  good pace  05/22/24 Nustep level 5 x 6 minutes 2.5# LAQ 2.5# marches 2.5# hip abduction Gait outside with CGA 1/2 parking [place], some small shuffling gait, cues to pick up feet Ball b/n knees squeeze Red tband clamshells STM to the left hip and buttock  05/14/24 Nustep level 5 x 6 minutes Gait outside with [facility] and HHA around 1/2 the parking [employer] 2.5# LAQ 2.5# marches Standing hip abduction 2# Ball b/n knees squeeze Red tband HS curls Red tband clamshells Gait out to the car with [facility]  05/07/24 Nustep level 5 x 5 minutes 2# LAQ 2x10 2# marches 2x10 Standing with walker 2# hip abduction Ball b/n knees squeeze Red tband HS curls Seated red tband rows Gait with SPC and HHA around 1/2 the parking island one rest break  04/30/24 Nustep level  x 6 minutes Sit to stand 2x5  Ball b/n knees squeeze 2x10 Red tband HS curls 2x10 2lb LAQ 2x10 2lb marches 2x10 Shoulder ext red 2x10  04/24/24 1.5# LAQ 2x10 1.5# marches 2x10 Red tband HS curls 2x10 Ball b/n knees squeeze 2x10 Red tband clamshells 2x10 Toe and heel raises in sitting Nustep level 5 x 5 minutes Gait outside out the back door with [facility] and HHA through the grass negotiated curb to her car  04/09/24 Evaluation                                                                                                                                 PATIENT EDUCATION:  Education details: Evaluation, POC, HEP Person educated: Patient Education method: Medical Illustrator Education comprehension: verbalized understanding  HOME EXERCISE PROGRAM: Standing at sink, marches, hip abduction, HS curls and small mini squats  ASSESSMENT:  CLINICAL IMPRESSION: Patient reports that she is doing okay, she reports slow and at times unsteady, tends to want to hold on for balance, she has some weakness in the LE's  Patient is  a 89 y.o. female who was seen today for physical therapy evaluation and treatment for weakness, loss of balance and difficulty walking.  She reports multiple falls in the past year but only one in the past 6 months, she reports that she is really having difficulty getting in and out of the car, has to lift the leg with her hands, she reports that she has OP and the MD wants her to exercise   OBJECTIVE IMPAIRMENTS: Abnormal gait, cardiopulmonary status limiting activity, decreased activity tolerance, decreased balance, decreased endurance, decreased mobility, difficulty walking, decreased ROM, decreased strength, impaired perceived functional ability, increased muscle spasms, impaired flexibility, improper body mechanics, postural dysfunction, and pain.   REHAB POTENTIAL: Good  CLINICAL DECISION MAKING: Stable/uncomplicated  EVALUATION COMPLEXITY: Low   GOALS: Goals reviewed with patient? Yes  SHORT TERM GOALS: Target date: 05/01/24  Independent with initial HEP Baseline: Goal status: met 05/14/24   LONG TERM GOALS: Target date: 07/09/24  Independent with advanced HEP Baseline:  Goal status: ongoing  05/29/24  2.  Decrease TUG time to 25 seconds Baseline: 42 seconds Goal status: progressing 06/04/24  3.  Improve 3 minute walk test to 400 feet Baseline:  Goal status: ongoing  06/12/24  4.  Report 50% easier to get in and out of car Baseline: reports difficulty an 8/10 Goal status: progressing 05/14/24 reports pain a 6/10, progressing 06/04/24 5/10  5.  Increase lumbar ROM 25% Baseline:  Goal status: met 06/12/24  PLAN:  PT FREQUENCY: 1-2x/week  PT DURATION: 12 weeks  PLANNED INTERVENTIONS: 97164- PT Re-evaluation, 97110-Therapeutic exercises, 97530- Therapeutic activity, 97112- Neuromuscular re-education, 97535- Self Care, 02859- Manual therapy, [phone]- Gait training, [phone]- Electrical stimulation (unattended), Patient/Family education, Balance training, Stair training,  Cryotherapy, and Moist heat.  PLAN FOR NEXT SESSION: Gym activities, balance, strength, endurance and gait   Emersen Mascari W, PT 06/12/2024, 2:43 PM

## 2024-06-18 ENCOUNTER — Ambulatory Visit: Admitting: Physical Therapy

## 2024-06-18 ENCOUNTER — Encounter: Payer: Self-pay | Admitting: Physical Therapy

## 2024-06-18 DIAGNOSIS — M6281 Muscle weakness (generalized): Secondary | ICD-10-CM

## 2024-06-18 DIAGNOSIS — R262 Difficulty in walking, not elsewhere classified: Secondary | ICD-10-CM

## 2024-06-18 NOTE — Therapy (Signed)
 OUTPATIENT PHYSICAL THERAPY THORACOLUMBAR TREATMENT Progress Note Reporting Period 04/09/24 to 06/18/24  See note below for Objective Data and Assessment of Progress/Goals.      Patient Name: Tina [name] MRN: [id number] DOB:Jun 03, 1931, 89 y.o., female Today's Date: 06/18/2024  END OF SESSION:  PT End of Session - 06/18/24 1451     Visit Number 10    Date for Recertification  07/09/24    Authorization Type [employer] medicare 9 of 12    PT Start Time 1445    PT Stop Time 1530    PT Time Calculation (min) 45 min    Activity Tolerance Patient tolerated treatment well    Behavior During Therapy WFL for tasks assessed/performed          Past Medical History:  Diagnosis Date   Canker sore    Cystocele    Depression    Diverticulosis    Esophageal dysmotility    Esophageal stricture    GERD (gastroesophageal reflux disease)    Heartburn    Hypertension    TIA (transient ischemic attack)    Past Surgical History:  Procedure Laterality Date   APPENDECTOMY     BRAVO PH STUDY N/A 01/01/2020   Procedure: BRAVO PH STUDY- 48hrs;  Surgeon: Shila Gustav GAILS, MD;  Location: [facility];  Service: Endoscopy;  Laterality: N/A;   CATARACT EXTRACTION     COLON SURGERY     ESOPHAGOGASTRODUODENOSCOPY N/A 01/01/2020   Procedure: ESOPHAGOGASTRODUODENOSCOPY (EGD);  Surgeon: Nandigam, Kavitha V, MD;  Location: [facility];  Service: Endoscopy;  Laterality: N/A;   JOINT REPLACEMENT     PARTIAL COLECTOMY  09/1991   due to diverticulitis   SHOULDER SURGERY     right shoulder- removal calcium  deposit   TOTAL HIP ARTHROPLASTY  6/03   TOTAL HIP ARTHROPLASTY Left 02/24/2016   Procedure: LEFT TOTAL HIP ARTHROPLASTY ANTERIOR APPROACH;  Surgeon: Dempsey Moan, MD;  Location: [facility];  Service: Orthopedics;  Laterality: Left;   Patient Active Problem List   Diagnosis Date Noted   Hyperlipidemia 04/18/2022   ILD (interstitial lung disease) (HCC) 12/22/2021   Chronic cough 12/22/2021   Chest  pressure 10/16/2020   HTN (hypertension) 02/17/2020   Coronary artery calcification 11/20/2019   Angina pectoris 11/20/2019   OA (osteoarthritis) of hip 02/24/2016   Gastro-esophageal reflux disease without esophagitis 08/26/2013   Diverticulosis of colon 02/08/2012   Dysphagia 02/08/2012    PCP: Seabron, MD  REFERRING PROVIDER: DOROTHA Alexander, MD  REFERRING DIAG: Osteoporosis, back pain, weakness  Rationale for Evaluation and Treatment: Rehabilitation  THERAPY DIAG:  Difficulty in walking, not elsewhere classified  Muscle weakness (generalized)  ONSET DATE: 03/11/24  SUBJECTIVE:  SUBJECTIVE STATEMENT: Okay, I have been doing fine  04/09/24 Patient reports that she has been having more and more problems getting around, she reports numerous falls, but reports just one fall in the past 6 months.  She reports that she is more and more stooped over and feels weak, she does report pain in the back and the hips and pelvis and legs, reports that seems to be after starting the Prolia  PERTINENT HISTORY:  Depression, HTN, TIA, shoulder scope, THA x 2  PAIN:  Are you having pain? Yes: NPRS scale: 5/10 Pain location: back, hips and thighs Pain description: dull ache Aggravating factors: worse at night Psin up to 7/10 Relieving factors: better in the day pain can be a 3/10 Tylenol , Advil  PRECAUTIONS: None  RED FLAGS: None   WEIGHT BEARING RESTRICTIONS: No  FALLS:  Has patient fallen in last 6 months? Yes. Number of falls 1  LIVING ENVIRONMENT: Lives with: lives alone Lives in: House/apartment Stairs: No Has following equipment at home: Single point cane, Tour manager, and Grab bars  OCCUPATION: retired  PLOF: Independent with community mobility without device, drives, shops, light yardwork, cooks  and cleans  PATIENT GOALS: do more, keep balance, keep going, feel stronger  NEXT MD VISIT: unsure  OBJECTIVE:  Note: Objective measures were completed at Evaluation unless otherwise noted.  DIAGNOSTIC FINDINGS:  none  COGNITION: Overall cognitive status: Within functional limits for tasks assessed     SENSATION: WFL  MUSCLE LENGTH: Mild tightness in the calves, HS and piriformis  POSTURE: rounded shoulders, forward head, and decreased lumbar lordosis  PALPATION: Denies tenderness, just c/o deep aches  LUMBAR ROM:   AROM eval  Flexion Decreased 50%  Extension Decreased 100%  Right lateral flexion   Left lateral flexion   Right rotation   Left rotation    (Blank rows = not tested)  LOWER EXTREMITY MMT:    MMT Right eval Left eval  Hip flexion 3+ 3+  Hip extension    Hip abduction 3+ 3_+  Hip adduction    Hip internal rotation    Hip external rotation    Knee flexion 3+ 3+  Knee extension 4- 4-  Ankle dorsiflexion 4- 4-  Ankle plantarflexion    Ankle inversion    Ankle eversion     (Blank rows = not tested)  FUNCTIONAL TESTS:  TUG = 42 seconds with SPC, 06/04/24 31 seconds 3 MWT= 200 feet Getting in and out of car:  She has to lift the right leg with her hands, reports difficulty a 7/10  GAIT: Distance walked: 200 feet  Assistive device utilized: Single point cane Level of assistance: Modified independence Comments: very slow, very unsure of herself, tends to use walls and furniture to help with balance  TREATMENT DATE:  06/18/24 Nustep level 5 x 6 minutes Gait outside with [facility] some HHA around the parking [place] and some in the grass, discussion on safety with walking in the grass 2.5# marches 2.5# LAQ STM to the left hip area  06/12/24 Nustep level 5 x 6 minutes 3# LAQ 3#marches 3# hip abduction More square 1 and 2 blocks step overs On airex cone toe touches Side step on and off airex Standing ball toss Side stepping Fwd backward  walking  06/04/24 LEg curls 15# 2x10 Nustep level 5 x 5 minutes Gait outside around the parking [place] On airex ball toss 2.5# LAQ 2.5# marches Ball b/n knees  Tug 31 seconds  05/29/24 Nustep level 5 x  6 minutes Red tband clamshells Ball b/n knees squeeze 2.5# LAQ 2.5# marches 2.5# hip abduction Side stepping Direction changes Side step over bar Fwd bkwd step over bar Cone toe touches with cane Gait out to car with good pace  05/22/24 Nustep level 5 x 6 minutes 2.5# LAQ 2.5# marches 2.5# hip abduction Gait outside with CGA 1/2 parking [place], some small shuffling gait, cues to pick up feet Ball b/n knees squeeze Red tband clamshells STM to the left hip and buttock  05/14/24 Nustep level 5 x 6 minutes Gait outside with [facility] and HHA around 1/2 the parking [employer] 2.5# LAQ 2.5# marches Standing hip abduction 2# Ball b/n knees squeeze Red tband HS curls Red tband clamshells Gait out to the car with [facility]  05/07/24 Nustep level 5 x 5 minutes 2# LAQ 2x10 2# marches 2x10 Standing with walker 2# hip abduction Ball b/n knees squeeze Red tband HS curls Seated red tband rows Gait with SPC and HHA around 1/2 the parking island one rest break  04/30/24 Nustep level  x 6 minutes Sit to stand 2x5  Ball b/n knees squeeze 2x10 Red tband HS curls 2x10 2lb LAQ 2x10 2lb marches 2x10 Shoulder ext red 2x10  04/24/24 1.5# LAQ 2x10 1.5# marches 2x10 Red tband HS curls 2x10 Ball b/n knees squeeze 2x10 Red tband clamshells 2x10 Toe and heel raises in sitting Nustep level 5 x 5 minutes Gait outside out the back door with SPC and HHA through the grass negotiated curb to her car  04/09/24 Evaluation                                                                                                                                 PATIENT EDUCATION:  Education details: Evaluation, POC, HEP Person educated: Patient Education method: Medical Illustrator Education  comprehension: verbalized understanding  HOME EXERCISE PROGRAM: Standing at sink, marches, hip abduction, HS curls and small mini squats  ASSESSMENT:  CLINICAL IMPRESSION: Patient reports that she is doing okay, she is doing better, and was able to walk further but did feel fatigued.  She does report some pain in the left anterior and lateral hip area, I did try some STM with the tgun to this area to see if this would help.  Patient is a 89 y.o. female who was seen today for physical therapy evaluation and treatment for weakness, loss of balance and difficulty walking.  She reports multiple falls in the past year but only one in the past 6 months, she reports that she is really having difficulty getting in and out of the car, has to lift the leg with her hands, she reports that she has OP and the MD wants her to exercise   OBJECTIVE IMPAIRMENTS: Abnormal gait, cardiopulmonary status limiting activity, decreased activity tolerance, decreased balance, decreased endurance, decreased mobility, difficulty walking, decreased ROM, decreased strength, impaired perceived functional ability, increased muscle spasms, impaired flexibility, improper body  mechanics, postural dysfunction, and pain.   REHAB POTENTIAL: Good  CLINICAL DECISION MAKING: Stable/uncomplicated  EVALUATION COMPLEXITY: Low   GOALS: Goals reviewed with patient? Yes  SHORT TERM GOALS: Target date: 05/01/24  Independent with initial HEP Baseline: Goal status: met 05/14/24   LONG TERM GOALS: Target date: 07/09/24  Independent with advanced HEP Baseline:  Goal status: ongoing 05/29/24  2.  Decrease TUG time to 25 seconds Baseline: 42 seconds Goal status: progressing 06/04/24  3.  Improve 3 minute walk test to 400 feet Baseline:  Goal status: ongoing  06/12/24  4.  Report 50% easier to get in and out of car Baseline: reports difficulty an 8/10 Goal status: progressing 05/14/24 reports pain a 6/10, progressing 06/04/24  5/10, met 06/18/24  5.  Increase lumbar ROM 25% Baseline:  Goal status: met 06/12/24  PLAN:  PT FREQUENCY: 1-2x/week  PT DURATION: 12 weeks  PLANNED INTERVENTIONS: 97164- PT Re-evaluation, 97110-Therapeutic exercises, 97530- Therapeutic activity, 97112- Neuromuscular re-education, 97535- Self Care, 02859- Manual therapy, [phone]- Gait training, [phone]- Electrical stimulation (unattended), Patient/Family education, Balance training, Stair training, Cryotherapy, and Moist heat.  PLAN FOR NEXT SESSION: will look at HEP and gait and possible D/C   OBADIAH OZELL ORN, PT 06/18/2024, 2:52 PM

## 2024-07-03 ENCOUNTER — Encounter: Payer: Self-pay | Admitting: Physical Therapy

## 2024-07-03 ENCOUNTER — Ambulatory Visit: Attending: Internal Medicine | Admitting: Physical Therapy

## 2024-07-03 DIAGNOSIS — M6281 Muscle weakness (generalized): Secondary | ICD-10-CM | POA: Insufficient documentation

## 2024-07-03 DIAGNOSIS — R262 Difficulty in walking, not elsewhere classified: Secondary | ICD-10-CM | POA: Diagnosis present

## 2024-07-03 NOTE — Therapy (Signed)
 OUTPATIENT PHYSICAL THERAPY THORACOLUMBAR TREATMENT     Patient Name: Tina [name] MRN: [id number] DOB:May 30, 1931, 89 y.o., female Today's Date: 07/03/2024  END OF SESSION:  PT End of Session - 07/03/24 1529     Visit Number 11    Date for Recertification  07/09/24    Authorization Type [employer] medicare10 of 12    PT Start Time 1529    PT Stop Time 1615    PT Time Calculation (min) 46 min    Activity Tolerance Patient tolerated treatment well    Behavior During Therapy WFL for tasks assessed/performed          Past Medical History:  Diagnosis Date   Canker sore    Cystocele    Depression    Diverticulosis    Esophageal dysmotility    Esophageal stricture    GERD (gastroesophageal reflux disease)    Heartburn    Hypertension    TIA (transient ischemic attack)    Past Surgical History:  Procedure Laterality Date   APPENDECTOMY     BRAVO PH STUDY N/A 01/01/2020   Procedure: BRAVO PH STUDY- 48hrs;  Surgeon: Shila Gustav GAILS, MD;  Location: [facility];  Service: Endoscopy;  Laterality: N/A;   CATARACT EXTRACTION     COLON SURGERY     ESOPHAGOGASTRODUODENOSCOPY N/A 01/01/2020   Procedure: ESOPHAGOGASTRODUODENOSCOPY (EGD);  Surgeon: Nandigam, Kavitha V, MD;  Location: [facility];  Service: Endoscopy;  Laterality: N/A;   JOINT REPLACEMENT     PARTIAL COLECTOMY  09/1991   due to diverticulitis   SHOULDER SURGERY     right shoulder- removal calcium  deposit   TOTAL HIP ARTHROPLASTY  6/03   TOTAL HIP ARTHROPLASTY Left 02/24/2016   Procedure: LEFT TOTAL HIP ARTHROPLASTY ANTERIOR APPROACH;  Surgeon: Dempsey Moan, MD;  Location: [facility];  Service: Orthopedics;  Laterality: Left;   Patient Active Problem List   Diagnosis Date Noted   Hyperlipidemia 04/18/2022   ILD (interstitial lung disease) (HCC) 12/22/2021   Chronic cough 12/22/2021   Chest pressure 10/16/2020   HTN (hypertension) 02/17/2020   Coronary artery calcification 11/20/2019   Angina pectoris 11/20/2019    OA (osteoarthritis) of hip 02/24/2016   Gastro-esophageal reflux disease without esophagitis 08/26/2013   Diverticulosis of colon 02/08/2012   Dysphagia 02/08/2012    PCP: Seabron, MD  REFERRING PROVIDER: DOROTHA Alexander, MD  REFERRING DIAG: Osteoporosis, back pain, weakness  Rationale for Evaluation and Treatment: Rehabilitation  THERAPY DIAG:  Difficulty in walking, not elsewhere classified  Muscle weakness (generalized)  ONSET DATE: 03/11/24  SUBJECTIVE:  SUBJECTIVE STATEMENT: I think I am doing okay, no falls  04/09/24 Patient reports that she has been having more and more problems getting around, she reports numerous falls, but reports just one fall in the past 6 months.  She reports that she is more and more stooped over and feels weak, she does report pain in the back and the hips and pelvis and legs, reports that seems to be after starting the Prolia  PERTINENT HISTORY:  Depression, HTN, TIA, shoulder scope, THA x 2  PAIN:  Are you having pain? Yes: NPRS scale: 5/10 Pain location: back, hips and thighs Pain description: dull ache Aggravating factors: worse at night Psin up to 7/10 Relieving factors: better in the day pain can be a 3/10 Tylenol , Advil  PRECAUTIONS: None  RED FLAGS: None   WEIGHT BEARING RESTRICTIONS: No  FALLS:  Has patient fallen in last 6 months? Yes. Number of falls 1  LIVING ENVIRONMENT: Lives with: lives alone Lives in: House/apartment Stairs: No Has following equipment at home: Single point cane, Tour manager, and Grab bars  OCCUPATION: retired  PLOF: Independent with community mobility without device, drives, shops, light yardwork, cooks and cleans  PATIENT GOALS: do more, keep balance, keep going, feel stronger  NEXT MD VISIT: unsure  OBJECTIVE:  Note:  Objective measures were completed at Evaluation unless otherwise noted.  DIAGNOSTIC FINDINGS:  none  COGNITION: Overall cognitive status: Within functional limits for tasks assessed     SENSATION: WFL  MUSCLE LENGTH: Mild tightness in the calves, HS and piriformis  POSTURE: rounded shoulders, forward head, and decreased lumbar lordosis  PALPATION: Denies tenderness, just c/o deep aches  LUMBAR ROM:   AROM eval  Flexion Decreased 50%  Extension Decreased 100%  Right lateral flexion   Left lateral flexion   Right rotation   Left rotation    (Blank rows = not tested)  LOWER EXTREMITY MMT:    MMT Right eval Left eval  Hip flexion 3+ 3+  Hip extension    Hip abduction 3+ 3_+  Hip adduction    Hip internal rotation    Hip external rotation    Knee flexion 3+ 3+  Knee extension 4- 4-  Ankle dorsiflexion 4- 4-  Ankle plantarflexion    Ankle inversion    Ankle eversion     (Blank rows = not tested)  FUNCTIONAL TESTS:  TUG = 42 seconds with SPC, 06/04/24 31 seconds 3 MWT= 200 feet Getting in and out of car:  She has to lift the right leg with her hands, reports difficulty a 7/10  GAIT: Distance walked: 200 feet  Assistive device utilized: Single point cane Level of assistance: Modified independence Comments: very slow, very unsure of herself, tends to use walls and furniture to help with balance  TREATMENT DATE:  07/03/24 Nustep level 4 x 6 minutes 2.5# LAQ 2.5# marches Blue tband [employer] b/n knees squeeze Discussion about safety and fall risk and fall prevention Review of HEP and walking program  06/18/24 Nustep level 5 x 6 minutes Gait outside with [facility] some HHA around the parking [place] and some in the grass, discussion on safety with walking in the grass 2.5# marches 2.5# LAQ STM to the left hip area  06/12/24 Nustep level 5 x 6 minutes 3# LAQ 3#marches 3# hip abduction More square 1 and 2 blocks step overs On airex cone toe  touches Side step on and off airex Standing ball toss Side stepping Fwd backward walking  06/04/24 LEg  curls 15# 2x10 Nustep level 5 x 5 minutes Gait outside around the parking [place] On airex ball toss 2.5# LAQ 2.5# marches Ball b/n knees  Tug 31 seconds  05/29/24 Nustep level 5 x 6 minutes Red tband clamshells Ball b/n knees squeeze 2.5# LAQ 2.5# marches 2.5# hip abduction Side stepping Direction changes Side step over bar Fwd bkwd step over bar Cone toe touches with cane Gait out to car with good pace  05/22/24 Nustep level 5 x 6 minutes 2.5# LAQ 2.5# marches 2.5# hip abduction Gait outside with CGA 1/2 parking [place], some small shuffling gait, cues to pick up feet Ball b/n knees squeeze Red tband clamshells STM to the left hip and buttock  05/14/24 Nustep level 5 x 6 minutes Gait outside with [facility] and HHA around 1/2 the parking [employer] 2.5# LAQ 2.5# marches Standing hip abduction 2# Ball b/n knees squeeze Red tband HS curls Red tband clamshells Gait out to the car with [facility]  05/07/24 Nustep level 5 x 5 minutes 2# LAQ 2x10 2# marches 2x10 Standing with walker 2# hip abduction Ball b/n knees squeeze Red tband HS curls Seated red tband rows Gait with SPC and HHA around 1/2 the parking island one rest break  04/30/24 Nustep level  x 6 minutes Sit to stand 2x5  Ball b/n knees squeeze 2x10 Red tband HS curls 2x10 2lb LAQ 2x10 2lb marches 2x10 Shoulder ext red 2x10  04/24/24 1.5# LAQ 2x10 1.5# marches 2x10 Red tband HS curls 2x10 Ball b/n knees squeeze 2x10 Red tband clamshells 2x10 Toe and heel raises in sitting Nustep level 5 x 5 minutes Gait outside out the back door with SPC and HHA through the grass negotiated curb to her car  04/09/24 Evaluation                                                                                                                                 PATIENT EDUCATION:  Education details: Evaluation, POC,  HEP Person educated: Patient Education method: Medical Illustrator Education comprehension: verbalized understanding  HOME EXERCISE PROGRAM: Standing at sink, marches, hip abduction, HS curls and small mini squats  ASSESSMENT:  CLINICAL IMPRESSION: Patient reports that she is doing okay,she feels like she is doing.  We reviewed HEP, fall risks, fall prevention, walking daily.  She verbalized understanding.  She reports that she feels a lot better than when we started and is moving better, however she does report that she still has some weakness and some pain in the hips, she reports that she is going to be very busy over the next month so we will stop treatment today  Patient is a 89 y.o. female who was seen today for physical therapy evaluation and treatment for weakness, loss of balance and difficulty walking.  She reports multiple falls in the past year but only one in the past 6 months, she reports that she is really having difficulty getting  in and out of the car, has to lift the leg with her hands, she reports that she has OP and the MD wants her to exercise   OBJECTIVE IMPAIRMENTS: Abnormal gait, cardiopulmonary status limiting activity, decreased activity tolerance, decreased balance, decreased endurance, decreased mobility, difficulty walking, decreased ROM, decreased strength, impaired perceived functional ability, increased muscle spasms, impaired flexibility, improper body mechanics, postural dysfunction, and pain.   REHAB POTENTIAL: Good  CLINICAL DECISION MAKING: Stable/uncomplicated  EVALUATION COMPLEXITY: Low   GOALS: Goals reviewed with patient? Yes  SHORT TERM GOALS: Target date: 05/01/24  Independent with initial HEP Baseline: Goal status: met 05/14/24   LONG TERM GOALS: Target date: 07/09/24  Independent with advanced HEP Baseline:  Goal status: met 07/03/24  2.  Decrease TUG time to 25 seconds Baseline: 42 seconds Goal status: met 24 seconds  07/03/24  3.  Improve 3 minute walk test to 400 feet Baseline:  Goal status: met 07/03/24  4.  Report 50% easier to get in and out of car Baseline: reports difficulty an 8/10 Goal status: progressing 05/14/24 reports pain a 6/10, progressing 06/04/24 5/10, met 06/18/24  5.  Increase lumbar ROM 25% Baseline:  Goal status: met 06/12/24  PLAN:  PT FREQUENCY: 1-2x/week  PT DURATION: 12 weeks  PLANNED INTERVENTIONS: 97164- PT Re-evaluation, 97110-Therapeutic exercises, 97530- Therapeutic activity, 97112- Neuromuscular re-education, 97535- Self Care, 02859- Manual therapy, [phone]- Gait training, [phone]- Electrical stimulation (unattended), Patient/Family education, Balance training, Stair training, Cryotherapy, and Moist heat.  PLAN FOR NEXT SESSION: D/C goals met    OBADIAH OZELL ORN, PT 07/03/2024, 3:30 PM

## 2025-01-15 ENCOUNTER — Ambulatory Visit: Admitting: Emergency Medicine
